# Patient Record
Sex: Female | Born: 1950 | Race: White | Hispanic: No | Marital: Married | Smoking: Former smoker
Health system: Southern US, Community
[De-identification: ages and names within clinical notes are randomized; demographics above are authoritative.]

## PROBLEM LIST (undated history)

## (undated) ENCOUNTER — Telehealth

## (undated) ENCOUNTER — Encounter

## (undated) ENCOUNTER — Ambulatory Visit

## (undated) ENCOUNTER — Encounter: Payer: MEDICARE | Attending: Family Medicine | Primary: Family Medicine

## (undated) ENCOUNTER — Ambulatory Visit: Payer: MEDICARE

## (undated) ENCOUNTER — Encounter: Payer: MEDICARE | Attending: Dermatology | Primary: Dermatology

## (undated) ENCOUNTER — Telehealth: Attending: Clinical | Primary: Clinical

## (undated) ENCOUNTER — Encounter: Payer: MEDICARE | Attending: Clinical | Primary: Clinical

## (undated) ENCOUNTER — Ambulatory Visit: Attending: Clinical | Primary: Clinical

## (undated) ENCOUNTER — Encounter
Attending: Student in an Organized Health Care Education/Training Program | Primary: Student in an Organized Health Care Education/Training Program

## (undated) ENCOUNTER — Encounter: Attending: Mental Health | Primary: Mental Health

## (undated) ENCOUNTER — Ambulatory Visit: Attending: Mental Health | Primary: Mental Health

## (undated) ENCOUNTER — Ambulatory Visit: Attending: Pharmacist | Primary: Pharmacist

## (undated) ENCOUNTER — Inpatient Hospital Stay

## (undated) ENCOUNTER — Encounter: Attending: Family Medicine | Primary: Family Medicine

## (undated) ENCOUNTER — Telehealth: Attending: Family Medicine | Primary: Family Medicine

## (undated) ENCOUNTER — Non-Acute Institutional Stay: Payer: MEDICARE | Attending: Dermatology | Primary: Dermatology

## (undated) ENCOUNTER — Non-Acute Institutional Stay: Payer: MEDICARE

## (undated) ENCOUNTER — Ambulatory Visit: Payer: MEDICARE | Attending: Dermatology | Primary: Dermatology

## (undated) ENCOUNTER — Encounter: Payer: MEDICARE | Attending: Mental Health | Primary: Mental Health

## (undated) ENCOUNTER — Telehealth
Attending: Student in an Organized Health Care Education/Training Program | Primary: Student in an Organized Health Care Education/Training Program

## (undated) ENCOUNTER — Telehealth: Attending: Mental Health | Primary: Mental Health

## (undated) DIAGNOSIS — M810 Age-related osteoporosis without current pathological fracture: Secondary | ICD-10-CM

## (undated) DIAGNOSIS — J449 Chronic obstructive pulmonary disease, unspecified: Secondary | ICD-10-CM

## (undated) DIAGNOSIS — M1990 Unspecified osteoarthritis, unspecified site: Secondary | ICD-10-CM

## (undated) DIAGNOSIS — F329 Major depressive disorder, single episode, unspecified: Secondary | ICD-10-CM

## (undated) DIAGNOSIS — R251 Tremor, unspecified: Secondary | ICD-10-CM

## (undated) DIAGNOSIS — L309 Dermatitis, unspecified: Secondary | ICD-10-CM

## (undated) DIAGNOSIS — R519 Headache, unspecified: Secondary | ICD-10-CM

## (undated) DIAGNOSIS — J45909 Unspecified asthma, uncomplicated: Secondary | ICD-10-CM

## (undated) DIAGNOSIS — R569 Unspecified convulsions: Secondary | ICD-10-CM

## (undated) DIAGNOSIS — Z8669 Personal history of other diseases of the nervous system and sense organs: Secondary | ICD-10-CM

## (undated) DIAGNOSIS — E079 Disorder of thyroid, unspecified: Secondary | ICD-10-CM

## (undated) DIAGNOSIS — G510 Bell's palsy: Secondary | ICD-10-CM

## (undated) DIAGNOSIS — I10 Essential (primary) hypertension: Secondary | ICD-10-CM

## (undated) DIAGNOSIS — F319 Bipolar disorder, unspecified: Secondary | ICD-10-CM

## (undated) DIAGNOSIS — K469 Unspecified abdominal hernia without obstruction or gangrene: Secondary | ICD-10-CM

## (undated) DIAGNOSIS — E559 Vitamin D deficiency, unspecified: Secondary | ICD-10-CM

## (undated) DIAGNOSIS — E538 Deficiency of other specified B group vitamins: Secondary | ICD-10-CM

## (undated) DIAGNOSIS — J309 Allergic rhinitis, unspecified: Secondary | ICD-10-CM

## (undated) DIAGNOSIS — F17200 Nicotine dependence, unspecified, uncomplicated: Secondary | ICD-10-CM

## (undated) DIAGNOSIS — K219 Gastro-esophageal reflux disease without esophagitis: Secondary | ICD-10-CM

## (undated) DIAGNOSIS — A630 Anogenital (venereal) warts: Secondary | ICD-10-CM

## (undated) DIAGNOSIS — R51 Headache: Secondary | ICD-10-CM

## (undated) DIAGNOSIS — G4730 Sleep apnea, unspecified: Secondary | ICD-10-CM

## (undated) DIAGNOSIS — R918 Other nonspecific abnormal finding of lung field: Secondary | ICD-10-CM

## (undated) DIAGNOSIS — F32A Depression, unspecified: Secondary | ICD-10-CM

## (undated) HISTORY — PX: CHOLECYSTECTOMY: SHX55

## (undated) HISTORY — PX: HERNIA REPAIR: SHX51

## (undated) HISTORY — PX: ABDOMINAL HYSTERECTOMY: SHX81

---
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
## 2004-08-07 ENCOUNTER — Ambulatory Visit: Payer: Self-pay | Admitting: Pain Medicine

## 2007-07-02 ENCOUNTER — Ambulatory Visit: Payer: Self-pay | Admitting: Family Medicine

## 2012-11-12 ENCOUNTER — Ambulatory Visit: Payer: Self-pay | Admitting: Specialist

## 2012-11-12 LAB — CREATININE, SERUM: Creatinine: 0.74 mg/dL (ref 0.60–1.30)

## 2013-05-18 ENCOUNTER — Ambulatory Visit: Payer: Self-pay | Admitting: Specialist

## 2013-05-18 LAB — CREATININE, SERUM: EGFR (Non-African Amer.): >60

## 2013-11-16 ENCOUNTER — Ambulatory Visit: Payer: Self-pay | Admitting: Specialist

## 2013-12-22 ENCOUNTER — Ambulatory Visit: Payer: Self-pay | Admitting: Neurology

## 2014-04-22 ENCOUNTER — Other Ambulatory Visit: Payer: Self-pay | Admitting: Oncology

## 2014-09-20 ENCOUNTER — Ambulatory Visit
Admit: 2014-09-20 | Disposition: A | Discharge status: Home / Self Care | Payer: Self-pay | Attending: Specialist | Admitting: Specialist

## 2014-10-12 ENCOUNTER — Other Ambulatory Visit: Payer: Self-pay | Admitting: Neurology

## 2014-10-12 DIAGNOSIS — G510 Bell's palsy: Secondary | ICD-10-CM

## 2014-10-18 ENCOUNTER — Ambulatory Visit
Admission: RE | Admit: 2014-10-18 | Discharge: 2014-10-18 | Disposition: A | Discharge status: Home / Self Care | Payer: Medicare Other | Source: Ambulatory Visit | Attending: Neurology | Admitting: Neurology

## 2014-10-18 DIAGNOSIS — G510 Bell's palsy: Secondary | ICD-10-CM | POA: Diagnosis present

## 2014-10-18 DIAGNOSIS — G319 Degenerative disease of nervous system, unspecified: Secondary | ICD-10-CM | POA: Diagnosis not present

## 2014-10-18 MED ORDER — GADOBENATE DIMEGLUMINE 529 MG/ML IV SOLN
20.0000 mL | Freq: Once | INTRAVENOUS | Status: AC | PRN
  Administered 2014-10-18: 17 mL via INTRAVENOUS

## 2015-06-16 ENCOUNTER — Encounter: Payer: Self-pay | Admitting: *Deleted

## 2015-06-19 ENCOUNTER — Encounter: Admission: RE | Payer: Self-pay | Source: Ambulatory Visit

## 2015-06-19 SURGERY — EGD (ESOPHAGOGASTRODUODENOSCOPY)
Anesthesia: General

## 2015-06-26 ENCOUNTER — Ambulatory Visit: Admission: RE | Admit: 2015-06-26 | Payer: Medicare Other | Source: Ambulatory Visit | Admitting: Gastroenterology

## 2015-06-26 HISTORY — DX: Gastro-esophageal reflux disease without esophagitis: K21.9

## 2015-06-26 HISTORY — DX: Age-related osteoporosis without current pathological fracture: M81.0

## 2015-06-26 HISTORY — DX: Unspecified convulsions: R56.9

## 2015-06-26 HISTORY — DX: Anogenital (venereal) warts: A63.0

## 2015-06-26 HISTORY — DX: Essential (primary) hypertension: I10

## 2015-06-26 HISTORY — DX: Major depressive disorder, single episode, unspecified: F32.9

## 2015-06-26 HISTORY — DX: Unspecified asthma, uncomplicated: J45.909

## 2015-06-26 HISTORY — DX: Unspecified abdominal hernia without obstruction or gangrene: K46.9

## 2015-06-26 HISTORY — DX: Personal history of other diseases of the nervous system and sense organs: Z86.69

## 2015-06-26 HISTORY — DX: Dermatitis, unspecified: L30.9

## 2015-06-26 HISTORY — DX: Unspecified osteoarthritis, unspecified site: M19.90

## 2015-06-26 HISTORY — DX: Chronic obstructive pulmonary disease, unspecified: J44.9

## 2015-06-26 HISTORY — DX: Sleep apnea, unspecified: G47.30

## 2015-06-26 HISTORY — DX: Bell's palsy: G51.0

## 2015-06-26 HISTORY — DX: Disorder of thyroid, unspecified: E07.9

## 2015-06-26 HISTORY — DX: Depression, unspecified: F32.A

## 2015-06-26 HISTORY — DX: Bipolar disorder, unspecified: F31.9

## 2015-07-14 ENCOUNTER — Encounter: Admission: RE | Payer: Self-pay | Source: Ambulatory Visit

## 2015-07-14 ENCOUNTER — Ambulatory Visit: Admission: RE | Admit: 2015-07-14 | Payer: Medicare Other | Source: Ambulatory Visit | Admitting: Gastroenterology

## 2015-07-14 SURGERY — COLONOSCOPY WITH PROPOFOL
Anesthesia: General

## 2015-07-24 ENCOUNTER — Other Ambulatory Visit: Payer: Self-pay | Admitting: Student

## 2015-07-24 DIAGNOSIS — R1084 Generalized abdominal pain: Secondary | ICD-10-CM

## 2015-07-24 DIAGNOSIS — R110 Nausea: Secondary | ICD-10-CM

## 2015-07-28 ENCOUNTER — Ambulatory Visit
Admission: RE | Admit: 2015-07-28 | Discharge: 2015-07-28 | Disposition: A | Discharge status: Home / Self Care | Payer: Medicare Other | Source: Ambulatory Visit | Attending: Student | Admitting: Student

## 2015-07-28 ENCOUNTER — Other Ambulatory Visit
Admission: RE | Admit: 2015-07-28 | Discharge: 2015-07-28 | Disposition: A | Discharge status: Home / Self Care | Payer: Medicare Other | Source: Ambulatory Visit | Attending: Gastroenterology | Admitting: Gastroenterology

## 2015-07-28 DIAGNOSIS — R110 Nausea: Secondary | ICD-10-CM | POA: Diagnosis present

## 2015-07-28 DIAGNOSIS — J449 Chronic obstructive pulmonary disease, unspecified: Secondary | ICD-10-CM | POA: Insufficient documentation

## 2015-07-28 DIAGNOSIS — Z01818 Encounter for other preprocedural examination: Secondary | ICD-10-CM | POA: Diagnosis present

## 2015-07-28 DIAGNOSIS — K529 Noninfective gastroenteritis and colitis, unspecified: Secondary | ICD-10-CM | POA: Diagnosis not present

## 2015-07-28 DIAGNOSIS — E278 Other specified disorders of adrenal gland: Secondary | ICD-10-CM | POA: Insufficient documentation

## 2015-07-28 DIAGNOSIS — I728 Aneurysm of other specified arteries: Secondary | ICD-10-CM | POA: Insufficient documentation

## 2015-07-28 DIAGNOSIS — R1084 Generalized abdominal pain: Secondary | ICD-10-CM

## 2015-07-28 LAB — CREATININE, SERUM: Creatinine, Ser: 0.91 mg/dL (ref 0.44–1.00)

## 2015-07-28 MED ORDER — IOHEXOL 350 MG/ML SOLN
125.0000 mL | Freq: Once | INTRAVENOUS | Status: AC | PRN
  Administered 2015-07-28: 125 mL via INTRAVENOUS

## 2015-08-25 ENCOUNTER — Encounter: Payer: Self-pay | Admitting: *Deleted

## 2015-08-28 ENCOUNTER — Ambulatory Visit: Payer: Medicare Other | Admitting: Anesthesiology

## 2015-08-28 ENCOUNTER — Ambulatory Visit
Admission: RE | Admit: 2015-08-28 | Discharge: 2015-08-28 | Disposition: A | Discharge status: Home / Self Care | Payer: Medicare Other | Source: Ambulatory Visit | Attending: Gastroenterology | Admitting: Gastroenterology

## 2015-08-28 ENCOUNTER — Encounter
Admission: RE | Disposition: A | Discharge status: Home / Self Care | Payer: Self-pay | Source: Ambulatory Visit | Attending: Gastroenterology

## 2015-08-28 ENCOUNTER — Encounter: Payer: Self-pay | Admitting: *Deleted

## 2015-08-28 DIAGNOSIS — G4730 Sleep apnea, unspecified: Secondary | ICD-10-CM | POA: Diagnosis not present

## 2015-08-28 DIAGNOSIS — K644 Residual hemorrhoidal skin tags: Secondary | ICD-10-CM | POA: Insufficient documentation

## 2015-08-28 DIAGNOSIS — R1030 Lower abdominal pain, unspecified: Secondary | ICD-10-CM | POA: Insufficient documentation

## 2015-08-28 DIAGNOSIS — R1084 Generalized abdominal pain: Secondary | ICD-10-CM | POA: Diagnosis not present

## 2015-08-28 DIAGNOSIS — M1990 Unspecified osteoarthritis, unspecified site: Secondary | ICD-10-CM | POA: Insufficient documentation

## 2015-08-28 DIAGNOSIS — M810 Age-related osteoporosis without current pathological fracture: Secondary | ICD-10-CM | POA: Insufficient documentation

## 2015-08-28 DIAGNOSIS — Z882 Allergy status to sulfonamides status: Secondary | ICD-10-CM | POA: Diagnosis not present

## 2015-08-28 DIAGNOSIS — Z79899 Other long term (current) drug therapy: Secondary | ICD-10-CM | POA: Diagnosis not present

## 2015-08-28 DIAGNOSIS — F319 Bipolar disorder, unspecified: Secondary | ICD-10-CM | POA: Insufficient documentation

## 2015-08-28 DIAGNOSIS — I10 Essential (primary) hypertension: Secondary | ICD-10-CM | POA: Diagnosis not present

## 2015-08-28 DIAGNOSIS — J449 Chronic obstructive pulmonary disease, unspecified: Secondary | ICD-10-CM | POA: Diagnosis not present

## 2015-08-28 DIAGNOSIS — D120 Benign neoplasm of cecum: Secondary | ICD-10-CM | POA: Diagnosis not present

## 2015-08-28 DIAGNOSIS — K2900 Acute gastritis without bleeding: Secondary | ICD-10-CM | POA: Insufficient documentation

## 2015-08-28 DIAGNOSIS — G510 Bell's palsy: Secondary | ICD-10-CM | POA: Insufficient documentation

## 2015-08-28 DIAGNOSIS — Z90710 Acquired absence of both cervix and uterus: Secondary | ICD-10-CM | POA: Diagnosis not present

## 2015-08-28 DIAGNOSIS — K219 Gastro-esophageal reflux disease without esophagitis: Secondary | ICD-10-CM | POA: Insufficient documentation

## 2015-08-28 DIAGNOSIS — F17200 Nicotine dependence, unspecified, uncomplicated: Secondary | ICD-10-CM | POA: Insufficient documentation

## 2015-08-28 DIAGNOSIS — D123 Benign neoplasm of transverse colon: Secondary | ICD-10-CM | POA: Insufficient documentation

## 2015-08-28 DIAGNOSIS — Z9049 Acquired absence of other specified parts of digestive tract: Secondary | ICD-10-CM | POA: Diagnosis not present

## 2015-08-28 DIAGNOSIS — Z9981 Dependence on supplemental oxygen: Secondary | ICD-10-CM | POA: Diagnosis not present

## 2015-08-28 DIAGNOSIS — Z888 Allergy status to other drugs, medicaments and biological substances status: Secondary | ICD-10-CM | POA: Insufficient documentation

## 2015-08-28 HISTORY — DX: Headache: R51

## 2015-08-28 HISTORY — DX: Deficiency of other specified B group vitamins: E53.8

## 2015-08-28 HISTORY — PX: COLONOSCOPY WITH PROPOFOL: SHX5780

## 2015-08-28 HISTORY — DX: Headache, unspecified: R51.9

## 2015-08-28 HISTORY — DX: Vitamin D deficiency, unspecified: E55.9

## 2015-08-28 HISTORY — DX: Tremor, unspecified: R25.1

## 2015-08-28 HISTORY — DX: Other nonspecific abnormal finding of lung field: R91.8

## 2015-08-28 HISTORY — DX: Allergic rhinitis, unspecified: J30.9

## 2015-08-28 HISTORY — PX: ESOPHAGOGASTRODUODENOSCOPY (EGD) WITH PROPOFOL: SHX5813

## 2015-08-28 SURGERY — COLONOSCOPY WITH PROPOFOL
Anesthesia: General

## 2015-08-28 MED ORDER — PROPOFOL 500 MG/50ML IV EMUL
INTRAVENOUS | Status: DC | PRN
  Administered 2015-08-28: 175 ug/kg/min via INTRAVENOUS

## 2015-08-28 MED ORDER — ONDANSETRON HCL 4 MG/2ML IJ SOLN
INTRAMUSCULAR | Status: DC | PRN
Start: 2015-08-28 — End: 2015-08-28
  Administered 2015-08-28: 4 mg via INTRAVENOUS

## 2015-08-28 MED ORDER — PROPOFOL 10 MG/ML IV BOLUS
INTRAVENOUS | Status: DC | PRN
  Administered 2015-08-28: 70 mg via INTRAVENOUS

## 2015-08-28 MED ORDER — ONDANSETRON HCL 4 MG/2ML IJ SOLN
4.0000 mg | Freq: Once | INTRAMUSCULAR | Status: AC
  Administered 2015-08-28: 4 mg via INTRAVENOUS

## 2015-08-28 MED ORDER — GLYCOPYRROLATE 0.2 MG/ML IJ SOLN
INTRAMUSCULAR | Status: DC | PRN
  Administered 2015-08-28: 0.2 mg via INTRAVENOUS

## 2015-08-28 MED ORDER — ONDANSETRON HCL 4 MG/2ML IJ SOLN
INTRAMUSCULAR | Status: AC
Start: 2015-08-28 — End: 2015-08-28
  Administered 2015-08-28: 4 mg via INTRAVENOUS
  Filled 2015-08-28: qty 2

## 2015-08-28 MED ORDER — LIDOCAINE HCL (CARDIAC) 20 MG/ML IV SOLN
INTRAVENOUS | Status: DC | PRN
  Administered 2015-08-28: 40 mg via INTRAVENOUS

## 2015-08-28 MED ORDER — SODIUM CHLORIDE 0.9 % IV SOLN
INTRAVENOUS | Status: DC
  Administered 2015-08-28: 1000 mL via INTRAVENOUS

## 2015-08-28 NOTE — Anesthesia Postprocedure Evaluation (Signed)
Anesthesia Post Note  Patient: Judy Erickson  Procedure(s) Performed: Procedure(s) (LRB): COLONOSCOPY WITH PROPOFOL (N/A) ESOPHAGOGASTRODUODENOSCOPY (EGD) WITH PROPOFOL (N/A)  Patient location during evaluation: Endoscopy Anesthesia Type: General Level of consciousness: awake and alert Pain management: pain level controlled Vital Signs Assessment: post-procedure vital signs reviewed and stable Respiratory status: spontaneous breathing, nonlabored ventilation, respiratory function stable and patient connected to nasal cannula oxygen Cardiovascular status: blood pressure returned to baseline and stable Postop Assessment: no signs of nausea or vomiting Anesthetic complications: no    Last Vitals:  Filed Vitals:   08/28/15 1130 08/28/15 1140  BP: 113/58 116/61  Pulse: 87 85  Temp:    Resp: 24 16    Last Pain:  Filed Vitals:   08/28/15 1158  PainSc: 8                  Martha Clan

## 2015-08-28 NOTE — Transfer of Care (Signed)
Immediate Anesthesia Transfer of Care Note  Patient: Judy Erickson  Procedure(s) Performed: Procedure(s): COLONOSCOPY WITH PROPOFOL (N/A) ESOPHAGOGASTRODUODENOSCOPY (EGD) WITH PROPOFOL (N/A)  Patient Location: PACU and Endoscopy Unit  Anesthesia Type:General  Level of Consciousness: sedated  Airway & Oxygen Therapy: Patient Spontanous Breathing and Patient connected to nasal cannula oxygen  Post-op Assessment: Report given to RN and Post -op Vital signs reviewed and stable  Post vital signs: Reviewed and stable  Last Vitals:  Filed Vitals:   08/28/15 0940 08/28/15 1120  BP: 121/78 89/50  Pulse: 87 77  Temp: 36.6 C 35.7 C  Resp: 20 16    Complications: No apparent anesthesia complications

## 2015-08-28 NOTE — Anesthesia Preprocedure Evaluation (Signed)
Anesthesia Evaluation  Patient identified by MRN, date of birth, ID band Patient awake    Reviewed: Allergy & Precautions, H&P , NPO status , Patient's Chart, lab work & pertinent test results, reviewed documented beta blocker date and time   History of Anesthesia Complications (+) PONV and history of anesthetic complications  Airway Mallampati: III  TM Distance: >3 FB Neck ROM: full    Dental no notable dental hx. (+) Partial Upper, Partial Lower   Pulmonary shortness of breath and with exertion, asthma , sleep apnea (no longer has it per the patient) , COPD,  COPD inhaler and oxygen dependent, neg recent URI, Current Smoker,    Pulmonary exam normal breath sounds clear to auscultation       Cardiovascular Exercise Tolerance: Good hypertension, On Medications (-) angina(-) CAD, (-) Past MI, (-) Cardiac Stents and (-) CABG Normal cardiovascular exam(-) dysrhythmias (-) Valvular Problems/Murmurs Rhythm:regular Rate:Normal     Neuro/Psych Seizures -,  PSYCHIATRIC DISORDERS (Bipolar)  Neuromuscular disease (Bell's palsy)    GI/Hepatic Neg liver ROS, hiatal hernia, GERD  ,  Endo/Other  negative endocrine ROS  Renal/GU negative Renal ROS  negative genitourinary   Musculoskeletal   Abdominal   Peds  Hematology negative hematology ROS (+)   Anesthesia Other Findings Past Medical History:   Arthritis                                                    Asthma                                                       COPD (chronic obstructive pulmonary disease) (*              GERD (gastroesophageal reflux disease)                       Abdominal hernia                                             Bell's palsy                                                 Depression                                                   Eczema                                                       Genital warts  History of migraine headaches                                Osteoporosis                                                 Seizures ([place])                                               Sleep apnea                                                  Thyroid disease                                              Allergic rhinitis due to allergen                            Hypertension                                                   Comment:benign   Bipolar disorder ([place])                                         Comment:bipolar affect, depressed   Genital warts                                                Headache                                                       Comment:migraines   Pulmonary nodules                                              Comment:subcentimeter   Tremor                                                       Vitamin B12 deficiency  Vitamin D deficiency                                         Reproductive/Obstetrics negative OB ROS                             Anesthesia Physical Anesthesia Plan  ASA: III  Anesthesia Plan: General   Post-op Pain Management:    Induction:   Airway Management Planned:   Additional Equipment:   Intra-op Plan:   Post-operative Plan:   Informed Consent: I have reviewed the patients History and Physical, chart, labs and discussed the procedure including the risks, benefits and alternatives for the proposed anesthesia with the patient or authorized representative who has indicated his/her understanding and acceptance.   Dental Advisory Given  Plan Discussed with: Anesthesiologist, CRNA and Surgeon  Anesthesia Plan Comments:         Anesthesia Quick Evaluation

## 2015-08-28 NOTE — Anesthesia Procedure Notes (Signed)
Date/Time: 08/28/2015 10:14 AM Performed by: Doreen Salvage Pre-anesthesia Checklist: Patient identified, Emergency Drugs available, Suction available and Patient being monitored Patient Re-evaluated:Patient Re-evaluated prior to inductionOxygen Delivery Method: Nasal cannula Intubation Type: IV induction Dental Injury: Teeth and Oropharynx as per pre-operative assessment  Comments: Nasal cannula with etCO2 monitoring

## 2015-08-28 NOTE — Op Note (Signed)
[facility] Patient Name: Judy Erickson Procedure Date: 08/28/2015 10:18 AM MRN: [id number] Account #: [id number] Date of Birth: [birth year] Admit Type: Outpatient Age: 65 Room: [facility] ENDO ROOM 3 Gender: Female Note Status: Finalized Procedure:            Upper GI endoscopy Indications:          Heartburn, Suspected esophageal reflux, Follow-up of                        hiatal hernia, Nausea with vomiting, Weight loss Patient Profile:      This is a 65 year old female. Providers:            Gerrit Heck. Rayann Heman, MD Referring MD:         Beverely Risen. Bowen (Referring MD) Medicines:            Propofol per Anesthesia Complications:        No immediate complications. Procedure:            Pre-Anesthesia Assessment:                       - Prior to the procedure, a History and Physical was                        performed, and patient medications, allergies and                        sensitivities were reviewed. The patient's tolerance of                        previous anesthesia was reviewed.                       After obtaining informed consent, the endoscope was                        passed under direct vision. Throughout the procedure,                        the patient's blood pressure, pulse, and oxygen                        saturations were monitored continuously. The Endoscope                        was introduced through the mouth, and advanced to the                        second part of duodenum. The upper GI endoscopy was                        accomplished without difficulty. The patient tolerated                        the procedure well. Findings:      The esophagus was normal. No hiatal hernia present during this study.      Diffuse moderate inflammation characterized by erythema was found in the       gastric fundus and in the gastric body. Biopsies were taken with a cold       forceps for histology.  The examined duodenum was normal.     Multiple biopsies were obtained with cold forceps for histology randomly       in the duodenal bulb and in the second portion of the duodenum. Impression:           - Normal esophagus.                       - Gastritis. Biopsied.                       - Normal examined duodenum.                       - Multiple biopsies were obtained in the duodenal bulb                        and in the second portion of the duodenum. Recommendation:       - Perform a colonoscopy.                       - The findings and recommendations were discussed with                        the patient.                       - The findings and recommendations were discussed with                        the patient's family.                       - Await pathology results.                       - Smoking cessation, weight loss. Procedure Code(s):    --- Professional ---                       [phone], Esophagogastroduodenoscopy, flexible, transoral;                        with biopsy, single or multiple Diagnosis Code(s):    --- Professional ---                       K29.70, Gastritis, unspecified, without bleeding                       R12, Heartburn                       K44.9, Diaphragmatic hernia without obstruction or                        gangrene                       R11.2, Nausea with vomiting, unspecified                       R63.4, Abnormal weight loss CPT copyright 2016 American Medical Association. All rights reserved. The codes documented in this report are preliminary and upon coder review may  be revised to meet current compliance requirements. Mellody Life, MD 08/28/2015  10:31:43 AM This report has been signed electronically. Number of Addenda: 0 Note Initiated On: 08/28/2015 10:18 AM      [facility]

## 2015-08-28 NOTE — H&P (Signed)
Primary Care Physician:  Verdie Shire, MD  Pre-Procedure History & Physical: HPI:  Judy Erickson is a 65 y.o. female is here for an endoscopy / colonoscopy   Past Medical History  Diagnosis Date  . Arthritis   . Asthma   . COPD (chronic obstructive pulmonary disease) ([place])   . GERD (gastroesophageal reflux disease)   . Abdominal hernia   . Bell's palsy   . Depression   . Eczema   . Genital warts   . History of migraine headaches   . Osteoporosis   . Seizures ([place])   . Sleep apnea   . Thyroid disease   . Allergic rhinitis due to allergen   . Hypertension     benign  . Bipolar disorder ([place])     bipolar affect, depressed  . Genital warts   . Headache     migraines  . Pulmonary nodules     subcentimeter  . Tremor   . Vitamin B12 deficiency   . Vitamin D deficiency     Past Surgical History  Procedure Laterality Date  . Abdominal hysterectomy    . Cholecystectomy    . Hernia repair      Prior to Admission medications   Medication Sig Start Date End Date Taking? Authorizing Provider  fluticasone (FLONASE) 50 MCG/ACT nasal spray Place into both nostrils daily.   Yes Historical Provider, MD  levocetirizine (XYZAL) 5 MG tablet Take 5 mg by mouth every evening.   Yes Historical Provider, MD  LORazepam (ATIVAN) 1 MG tablet Take 1 mg by mouth every 8 (eight) hours.   Yes Historical Provider, MD  losartan (COZAAR) 100 MG tablet Take 100 mg by mouth daily.   Yes Historical Provider, MD  vitamin B-12 (CYANOCOBALAMIN) 500 MCG tablet Take 500 mcg by mouth daily.   Yes Historical Provider, MD  Vitamin D, Cholecalciferol, 1000 units CAPS Take by mouth.   Yes Historical Provider, MD  albuterol (PROVENTIL HFA) 108 (90 Base) MCG/ACT inhaler Inhale into the lungs every 6 (six) hours as needed for wheezing or shortness of breath.    Historical Provider, MD  albuterol (PROVENTIL) (2.5 MG/3ML) 0.083% nebulizer solution Take 2.5 mg by nebulization every 6 (six) hours as needed for  wheezing or shortness of breath.    Historical Provider, MD  buPROPion (WELLBUTRIN XL) 300 MG 24 hr tablet Take 300 mg by mouth daily.    Historical Provider, MD  clobetasol ointment (TEMOVATE) [id number] % Apply 1 application topically 2 (two) times daily.    Historical Provider, MD  dexlansoprazole (DEXILANT) 60 MG capsule Take 60 mg by mouth daily.    Historical Provider, MD  dicyclomine (BENTYL) 20 MG tablet Take 20 mg by mouth every 6 (six) hours.    Historical Provider, MD  Fluticasone-Salmeterol (ADVAIR DISKUS) 250-50 MCG/DOSE AEPB Inhale 1 puff into the lungs 2 (two) times daily.    Historical Provider, MD  gabapentin (NEURONTIN) 300 MG capsule Take 300 mg by mouth 3 (three) times daily.    Historical Provider, MD  ipratropium (ATROVENT) 0.06 % nasal spray Place 2 sprays into both nostrils 4 (four) times daily.    Historical Provider, MD  Linaclotide Rolan Lipa) 145 MCG CAPS capsule Take 145 mcg by mouth daily.    Historical Provider, MD  lithium carbonate (LITHOBID) 300 MG CR tablet Take by mouth 2 (two) times daily.    Historical Provider, MD  memantine (NAMENDA) 5 MG tablet Take 5 mg by mouth 2 (two) times daily.    Historical  Provider, MD  ondansetron (ZOFRAN) 4 MG tablet Take 4 mg by mouth every 8 (eight) hours as needed for nausea or vomiting.    Historical Provider, MD  QUEtiapine (SEROQUEL) 100 MG tablet Take 100 mg by mouth at bedtime.    Historical Provider, MD  ranitidine (ZANTAC) 150 MG capsule Take 150 mg by mouth 2 (two) times daily.    Historical Provider, MD  sucralfate (CARAFATE) 1 g tablet Take 1 g by mouth 4 (four) times daily -  with meals and at bedtime.    Historical Provider, MD  tiotropium (SPIRIVA) 18 MCG inhalation capsule Place 18 mcg into inhaler and inhale daily.    Historical Provider, MD    Allergies as of 08/16/2015 - Review Complete 07/13/2015  Allergen Reaction Noted  . Nsaids  06/16/2015    History reviewed. No pertinent family history.  Social History    Social History  . Marital Status: Married    Spouse Name: N/A  . Number of Children: N/A  . Years of Education: N/A   Occupational History  . Not on file.   Social History Main Topics  . Smoking status: Current Every Day Smoker  . Smokeless tobacco: Not on file  . Alcohol Use: No  . Drug Use: No  . Sexual Activity: Not on file   Other Topics Concern  . Not on file   Social History Narrative     Physical Exam: BP 121/78 mmHg  Pulse 87  Temp(Src) 97.8 F (36.6 C) (Tympanic)  Resp 20  Ht 5\' 4"  (1.626 m)  Wt 77.111 kg (170 lb)  BMI 29.17 kg/m2  SpO2 98% General:   Alert,  pleasant and cooperative in NAD Head:  Normocephalic and atraumatic. Neck:  Supple; no masses or thyromegaly. Lungs:  Clear throughout to auscultation.    Heart:  Regular rate and rhythm. Abdomen:  Soft, nontender and nondistended. Normal bowel sounds, without guarding, and without rebound.   Neurologic:  Alert and  oriented x4;  grossly normal neurologically.  Impression/Plan: Judy Erickson is here for an endoscopy to be performed for reflux, ,weigiht loss,  f/u HH.   Colon for colitis on CT, abd pain  Risks, benefits, limitations, and alternatives regarding  Endoscopy/colonocopy have been reviewed with the patient.  Questions have been answered.  All parties agreeable.   Josefine Class, MD  08/28/2015, 10:12 AM

## 2015-08-28 NOTE — Discharge Instructions (Signed)

## 2015-08-28 NOTE — Op Note (Addendum)
[facility] Patient Name: Judy Erickson Procedure Date: 08/28/2015 10:31 AM MRN: [id number] Account #: [id number] Date of Birth: 1951-01-04 Admit Type: Outpatient Age: 65 Room: [facility] ENDO ROOM 3 Gender: Female Note Status: Finalized Procedure:            Colonoscopy Indications:          Last colonoscopy: 2006, , Generalized abdominal pain,                        Lower abdominal pain, Diarrhea, Abnormal CT of the GI                        tract( colitis transverse colon) Patient Profile:      This is a 65 year old female. Providers:            Gerrit Heck. Rayann Heman, MD Referring MD:         Beverely Risen. Bowen (Referring MD) Medicines:            Propofol per Anesthesia Complications:        No immediate complications. Procedure:            Pre-Anesthesia Assessment:                       - Prior to the procedure, a History and Physical was                        performed, and patient medications, allergies and                        sensitivities were reviewed. The patient's tolerance of                        previous anesthesia was reviewed.                       After obtaining informed consent, the colonoscope was                        passed under direct vision. Throughout the procedure,                        the patient's blood pressure, pulse, and oxygen                        saturations were monitored continuously. The                        Colonoscope was introduced through the anus and                        advanced to the the terminal ileum. The colonoscopy was                        performed without difficulty. The patient tolerated the                        procedure well. The quality of the bowel preparation                        was good. Findings:  The perianal exam findings include non-thrombosed external hemorrhoids.      A 3 mm polyp was found in the cecum. The polyp was sessile. The polyp       was removed with a jumbo cold  forceps. Resection and retrieval were       complete.      Three sessile polyps were found in the mid transverse colon. The polyps       were 4 to 5 mm in size. These polyps were removed with a cold snare.       Resection and retrieval were complete.      The exam was otherwise without abnormality on direct and retroflexion       views.      Biopsies for histology were taken with a cold forceps from the right       colon, left colon and rectum for evaluation of microscopic colitis. Impression:           - Non-thrombosed external hemorrhoids found on perianal                        exam.                       - One 3 mm polyp in the cecum, removed with a jumbo                        cold forceps. Resected and retrieved.                       - Three 4 to 5 mm polyps in the mid transverse colon,                        removed with a cold snare. Resected and retrieved.                       - The examination was otherwise normal on direct and                        retroflexion views.                       - Biopsies were taken with a cold forceps from the                        right colon, left colon and rectum for evaluation of                        microscopic colitis. Recommendation:       - Observe patient in GI recovery unit.                       - Resume regular diet.                       - Continue present medications.                       - Await pathology results.                       - Repeat colonoscopy for surveillance based on  pathology results.                       - Return to GI clinic.                       - The findings and recommendations were discussed with                        the patient.                       - The findings and recommendations were discussed with                        the patient's family. Procedure Code(s):    --- Professional ---                       [phone], Colonoscopy, flexible; with removal of tumor(s),                         polyp(s), or other lesion(s) by snare technique                       [id number], 51, Colonoscopy, flexible; with biopsy, single                        or multiple CPT copyright 2016 American Medical Association. All rights reserved. The codes documented in this report are preliminary and upon coder review may  be revised to meet current compliance requirements. Mellody Life, MD 08/28/2015 11:20:01 AM This report has been signed electronically. Number of Addenda: 0 Note Initiated On: 08/28/2015 10:31 AM Scope Withdrawal Time: 0 hours 20 minutes 58 seconds  Total Procedure Duration: 0 hours 40 minutes 4 seconds       [facility]

## 2015-08-29 LAB — SURGICAL PATHOLOGY

## 2015-08-30 ENCOUNTER — Encounter: Payer: Self-pay | Admitting: Gastroenterology

## 2015-12-07 ENCOUNTER — Other Ambulatory Visit: Payer: Self-pay | Admitting: Orthopedic Surgery

## 2015-12-07 DIAGNOSIS — M546 Pain in thoracic spine: Secondary | ICD-10-CM

## 2015-12-07 DIAGNOSIS — M5134 Other intervertebral disc degeneration, thoracic region: Secondary | ICD-10-CM

## 2015-12-13 ENCOUNTER — Ambulatory Visit
Admission: RE | Admit: 2015-12-13 | Discharge: 2015-12-13 | Disposition: A | Discharge status: Home / Self Care | Payer: Medicare Other | Source: Ambulatory Visit | Attending: Unknown Physician Specialty | Admitting: Unknown Physician Specialty

## 2015-12-13 ENCOUNTER — Other Ambulatory Visit: Payer: Self-pay | Admitting: Unknown Physician Specialty

## 2015-12-13 DIAGNOSIS — R51 Headache: Principal | ICD-10-CM

## 2015-12-13 DIAGNOSIS — R519 Headache, unspecified: Secondary | ICD-10-CM

## 2015-12-27 ENCOUNTER — Ambulatory Visit
Admission: RE | Admit: 2015-12-27 | Discharge: 2015-12-27 | Disposition: A | Discharge status: Home / Self Care | Payer: Medicare Other | Source: Ambulatory Visit | Attending: Orthopedic Surgery | Admitting: Orthopedic Surgery

## 2015-12-27 DIAGNOSIS — M5184 Other intervertebral disc disorders, thoracic region: Secondary | ICD-10-CM | POA: Diagnosis not present

## 2015-12-27 DIAGNOSIS — M5134 Other intervertebral disc degeneration, thoracic region: Secondary | ICD-10-CM

## 2015-12-27 DIAGNOSIS — M546 Pain in thoracic spine: Secondary | ICD-10-CM | POA: Diagnosis present

## 2016-03-04 ENCOUNTER — Other Ambulatory Visit: Payer: Self-pay | Admitting: Student

## 2016-03-04 DIAGNOSIS — R1013 Epigastric pain: Secondary | ICD-10-CM

## 2016-03-04 DIAGNOSIS — R110 Nausea: Secondary | ICD-10-CM

## 2016-03-20 ENCOUNTER — Encounter
Admission: RE | Admit: 2016-03-20 | Discharge: 2016-03-20 | Disposition: A | Discharge status: Home / Self Care | Payer: Medicare Other | Source: Ambulatory Visit | Attending: Student | Admitting: Student

## 2016-03-20 DIAGNOSIS — R110 Nausea: Secondary | ICD-10-CM | POA: Insufficient documentation

## 2016-03-20 DIAGNOSIS — R1013 Epigastric pain: Secondary | ICD-10-CM | POA: Diagnosis not present

## 2016-03-20 MED ORDER — TECHNETIUM TC 99M SULFUR COLLOID
2.0000 | Freq: Once | INTRAVENOUS | Status: AC | PRN
  Administered 2016-03-20: 2.11 via INTRAVENOUS

## 2016-04-30 ENCOUNTER — Other Ambulatory Visit: Payer: Self-pay | Admitting: Nurse Practitioner

## 2016-04-30 DIAGNOSIS — R42 Dizziness and giddiness: Secondary | ICD-10-CM

## 2016-05-13 ENCOUNTER — Ambulatory Visit
Admission: RE | Admit: 2016-05-13 | Discharge: 2016-05-13 | Disposition: A | Discharge status: Home / Self Care | Payer: Medicare Other | Source: Ambulatory Visit | Attending: Nurse Practitioner | Admitting: Nurse Practitioner

## 2016-05-13 ENCOUNTER — Other Ambulatory Visit: Payer: Self-pay | Admitting: Nurse Practitioner

## 2016-05-13 DIAGNOSIS — R42 Dizziness and giddiness: Secondary | ICD-10-CM

## 2016-05-13 DIAGNOSIS — R930 Abnormal findings on diagnostic imaging of skull and head, not elsewhere classified: Secondary | ICD-10-CM | POA: Diagnosis not present

## 2016-08-01 ENCOUNTER — Encounter: Payer: Self-pay | Admitting: *Deleted

## 2016-08-02 ENCOUNTER — Ambulatory Visit: Payer: Medicare Other | Admitting: Anesthesiology

## 2016-08-02 ENCOUNTER — Encounter
Admission: RE | Disposition: A | Discharge status: Home / Self Care | Payer: Self-pay | Source: Ambulatory Visit | Attending: Unknown Physician Specialty

## 2016-08-02 ENCOUNTER — Encounter: Payer: Self-pay | Admitting: *Deleted

## 2016-08-02 ENCOUNTER — Ambulatory Visit
Admission: RE | Admit: 2016-08-02 | Discharge: 2016-08-02 | Disposition: A | Discharge status: Home / Self Care | Payer: Medicare Other | Source: Ambulatory Visit | Attending: Unknown Physician Specialty | Admitting: Unknown Physician Specialty

## 2016-08-02 DIAGNOSIS — R1310 Dysphagia, unspecified: Secondary | ICD-10-CM | POA: Insufficient documentation

## 2016-08-02 DIAGNOSIS — F17210 Nicotine dependence, cigarettes, uncomplicated: Secondary | ICD-10-CM | POA: Insufficient documentation

## 2016-08-02 DIAGNOSIS — K210 Gastro-esophageal reflux disease with esophagitis: Secondary | ICD-10-CM | POA: Insufficient documentation

## 2016-08-02 DIAGNOSIS — M810 Age-related osteoporosis without current pathological fracture: Secondary | ICD-10-CM | POA: Diagnosis not present

## 2016-08-02 DIAGNOSIS — K2960 Other gastritis without bleeding: Secondary | ICD-10-CM | POA: Insufficient documentation

## 2016-08-02 DIAGNOSIS — J449 Chronic obstructive pulmonary disease, unspecified: Secondary | ICD-10-CM | POA: Diagnosis not present

## 2016-08-02 DIAGNOSIS — G4730 Sleep apnea, unspecified: Secondary | ICD-10-CM | POA: Insufficient documentation

## 2016-08-02 DIAGNOSIS — I10 Essential (primary) hypertension: Secondary | ICD-10-CM | POA: Insufficient documentation

## 2016-08-02 DIAGNOSIS — Z7951 Long term (current) use of inhaled steroids: Secondary | ICD-10-CM | POA: Diagnosis not present

## 2016-08-02 DIAGNOSIS — M1990 Unspecified osteoarthritis, unspecified site: Secondary | ICD-10-CM | POA: Insufficient documentation

## 2016-08-02 DIAGNOSIS — F319 Bipolar disorder, unspecified: Secondary | ICD-10-CM | POA: Insufficient documentation

## 2016-08-02 DIAGNOSIS — Z79899 Other long term (current) drug therapy: Secondary | ICD-10-CM | POA: Diagnosis not present

## 2016-08-02 HISTORY — PX: ESOPHAGOGASTRODUODENOSCOPY: SHX5428

## 2016-08-02 HISTORY — DX: Nicotine dependence, unspecified, uncomplicated: F17.200

## 2016-08-02 SURGERY — EGD (ESOPHAGOGASTRODUODENOSCOPY)
Anesthesia: General

## 2016-08-02 MED ORDER — LIDOCAINE HCL (CARDIAC) 20 MG/ML IV SOLN
INTRAVENOUS | Status: DC | PRN
  Administered 2016-08-02: 2 mL via INTRAVENOUS

## 2016-08-02 MED ORDER — SODIUM CHLORIDE 0.9 % IV SOLN
INTRAVENOUS | Status: DC
  Administered 2016-08-02: 10:00:00 via INTRAVENOUS

## 2016-08-02 MED ORDER — ONDANSETRON HCL 4 MG/2ML IJ SOLN
INTRAMUSCULAR | Status: AC
  Filled 2016-08-02: qty 2

## 2016-08-02 MED ORDER — PROPOFOL 10 MG/ML IV BOLUS
INTRAVENOUS | Status: AC
  Filled 2016-08-02: qty 20

## 2016-08-02 MED ORDER — PROPOFOL 10 MG/ML IV BOLUS
INTRAVENOUS | Status: DC | PRN
  Administered 2016-08-02: 30 mg via INTRAVENOUS

## 2016-08-02 MED ORDER — ONDANSETRON HCL 4 MG/2ML IJ SOLN
INTRAMUSCULAR | Status: DC | PRN
  Administered 2016-08-02: 4 mg via INTRAVENOUS

## 2016-08-02 MED ORDER — SODIUM CHLORIDE 0.9 % IV SOLN: INTRAVENOUS | Status: DC

## 2016-08-02 MED ORDER — PROPOFOL 500 MG/50ML IV EMUL
INTRAVENOUS | Status: DC | PRN
  Administered 2016-08-02: 120 ug/kg/min via INTRAVENOUS

## 2016-08-02 MED ORDER — LIDOCAINE HCL (PF) 2 % IJ SOLN
INTRAMUSCULAR | Status: AC
  Filled 2016-08-02: qty 2

## 2016-08-02 NOTE — Anesthesia Postprocedure Evaluation (Signed)
Anesthesia Post Note  Patient: Judy Erickson  Procedure(s) Performed: Procedure(s) (LRB): ESOPHAGOGASTRODUODENOSCOPY (EGD) (N/A)  Patient location during evaluation: PACU Anesthesia Type: General Level of consciousness: awake Pain management: pain level controlled Vital Signs Assessment: post-procedure vital signs reviewed and stable Respiratory status: spontaneous breathing Cardiovascular status: stable Anesthetic complications: no     Last Vitals:  Vitals:   08/02/16 1102 08/02/16 1112  BP: 116/82 136/66  Pulse: 78 81  Resp: 20 (!) 22  Temp:      Last Pain:  Vitals:   08/02/16 1032  TempSrc: Tympanic                 VAN STAVEREN,[name]

## 2016-08-02 NOTE — Transfer of Care (Signed)
Immediate Anesthesia Transfer of Care Note  Patient: Judy Erickson  Procedure(s) Performed: Procedure(s): ESOPHAGOGASTRODUODENOSCOPY (EGD) (N/A)  Patient Location: PACU  Anesthesia Type:General  Level of Consciousness: awake  Airway & Oxygen Therapy: Patient Spontanous Breathing  Post-op Assessment: Report given to RN  Post vital signs: Reviewed  Last Vitals:  Vitals:   08/02/16 0919  BP: (!) 146/66  Pulse: 83  Resp: 20  Temp: 36.5 C    Last Pain:  Vitals:   08/02/16 0919  TempSrc: Tympanic         Complications: No apparent anesthesia complications

## 2016-08-02 NOTE — Anesthesia Preprocedure Evaluation (Signed)
Anesthesia Evaluation  Patient identified by MRN, date of birth, ID band Patient awake    Reviewed: Allergy & Precautions, NPO status , reviewed documented beta blocker date and time   Airway Mallampati: II       Dental  (+) Upper Dentures, Lower Dentures   Pulmonary sleep apnea , COPD,  COPD inhaler, Current Smoker,     + decreased breath sounds      Cardiovascular Exercise Tolerance: Good hypertension, Pt. on medications  Rhythm:Regular Rate:Normal     Neuro/Psych Seizures -,  Depression Bipolar Disorder    GI/Hepatic Neg liver ROS, GERD  Medicated,  Endo/Other  negative endocrine ROS  Renal/GU negative Renal ROS     Musculoskeletal   Abdominal Normal abdominal exam  (+)   Peds  Hematology   Anesthesia Other Findings   Reproductive/Obstetrics                             Anesthesia Physical Anesthesia Plan  ASA: III  Anesthesia Plan: General   Post-op Pain Management:    Induction: Intravenous  Airway Management Planned: Natural Airway and Nasal Cannula  Additional Equipment:   Intra-op Plan:   Post-operative Plan:   Informed Consent: I have reviewed the patients History and Physical, chart, labs and discussed the procedure including the risks, benefits and alternatives for the proposed anesthesia with the patient or authorized representative who has indicated his/her understanding and acceptance.     Plan Discussed with: Surgeon  Anesthesia Plan Comments:         Anesthesia Quick Evaluation

## 2016-08-02 NOTE — Anesthesia Post-op Follow-up Note (Signed)
Anesthesia QCDR form completed.        

## 2016-08-02 NOTE — Op Note (Signed)
[facility] Patient Name: Judy Erickson Procedure Date: 08/02/2016 10:11 AM MRN: [id number] Account #: [id number] Date of Birth: 12/04/1950 Admit Type: Outpatient Age: 66 Room: [facility] ENDO ROOM 3 Gender: Female Note Status: Finalized Procedure:            Upper GI endoscopy Indications:          Dysphagia, Heartburn Providers:            Manya Silvas, MD Referring MD:         Verdie Shire (Referring MD) Medicines:            Propofol per Anesthesia Complications:        No immediate complications. Procedure:            Pre-Anesthesia Assessment:                       - After reviewing the risks and benefits, the patient                        was deemed in satisfactory condition to undergo the                        procedure.                       After obtaining informed consent, the endoscope was                        passed under direct vision. Throughout the procedure,                        the patient's blood pressure, pulse, and oxygen                        saturations were monitored continuously. The                        Colonoscope was introduced through the mouth, and                        advanced to the second part of duodenum. The upper GI                        endoscopy was accomplished without difficulty. The                        patient tolerated the procedure well. Findings:      LA Grade A (one or more mucosal breaks less than 5 mm, not extending       between tops of 2 mucosal folds) esophagitis with no bleeding was found       40 cm from the incisors. Biopsies were taken with a cold forceps for       histology. At the end of the procedure A guidewire was placed and the       scope was withdrawn. Dilation was performed with a Savary dilator with       mild resistance at 16 mm.      Diffuse and patchy mild inflammation characterized by erythema and       granularity was found in the gastric body and in the gastric  antrum.  Biopsies were taken with a cold forceps for histology. Biopsies were       taken with a cold forceps for Helicobacter pylori testing.      The examined duodenum was normal. Impression:           - LA Grade A reflux esophagitis. Biopsied. Dilated.                       - Gastritis. Biopsied.                       - Normal examined duodenum. Recommendation:       - Await pathology results. Manya Silvas, MD 08/02/2016 10:34:57 AM This report has been signed electronically. Number of Addenda: 0 Note Initiated On: 08/02/2016 10:11 AM      [facility]

## 2016-08-02 NOTE — H&P (Signed)
Primary Care Physician:  Verdie Shire, MD Primary Gastroenterologist:  Dr. Vira Agar  Pre-Procedure History & Physical: HPI:  Judy Erickson is a 66 y.o. female is here for an endoscopy.   Past Medical History:  Diagnosis Date  . Abdominal hernia   . Allergic rhinitis due to allergen   . Arthritis   . Asthma   . Bell's palsy   . Bipolar disorder ([place])    bipolar affect, depressed  . COPD (chronic obstructive pulmonary disease) ([place])   . Depression   . Eczema   . Genital warts   . Genital warts   . GERD (gastroesophageal reflux disease)   . Headache    migraines  . History of migraine headaches   . Hypertension    benign  . Osteoporosis   . Pulmonary nodules    subcentimeter  . Seizures ([place])   . Sleep apnea   . Thyroid disease   . Tobacco use disorder   . Tremor   . Vitamin B12 deficiency   . Vitamin D deficiency     Past Surgical History:  Procedure Laterality Date  . ABDOMINAL HYSTERECTOMY    . CHOLECYSTECTOMY    . COLONOSCOPY WITH PROPOFOL N/A 08/28/2015   Procedure: COLONOSCOPY WITH PROPOFOL;  Surgeon: Josefine Class, MD;  Location: [facility] ENDOSCOPY;  Service: Endoscopy;  Laterality: N/A;  . ESOPHAGOGASTRODUODENOSCOPY (EGD) WITH PROPOFOL N/A 08/28/2015   Procedure: ESOPHAGOGASTRODUODENOSCOPY (EGD) WITH PROPOFOL;  Surgeon: Josefine Class, MD;  Location: [facility] ENDOSCOPY;  Service: Endoscopy;  Laterality: N/A;  . HERNIA REPAIR      Prior to Admission medications   Medication Sig Start Date End Date Taking? Authorizing Provider  acetaminophen (TYLENOL) 500 MG tablet Take 1,000 mg by mouth.   Yes Historical Provider, MD  albuterol (PROVENTIL HFA) 108 (90 Base) MCG/ACT inhaler Inhale into the lungs every 6 (six) hours as needed for wheezing or shortness of breath.   Yes Historical Provider, MD  buPROPion (WELLBUTRIN XL) 300 MG 24 hr tablet Take 300 mg by mouth daily.   Yes Historical Provider, MD  clobetasol ointment (TEMOVATE) [id number] % Apply 1 application  topically 2 (two) times daily.   Yes Historical Provider, MD  dexlansoprazole (DEXILANT) 60 MG capsule Take 60 mg by mouth daily.   Yes Historical Provider, MD  dicyclomine (BENTYL) 20 MG tablet Take 20 mg by mouth every 6 (six) hours.   Yes Historical Provider, MD  fluticasone (FLONASE) 50 MCG/ACT nasal spray Place into both nostrils daily.   Yes Historical Provider, MD  Fluticasone-Salmeterol (ADVAIR DISKUS) 250-50 MCG/DOSE AEPB Inhale 1 puff into the lungs 2 (two) times daily.   Yes Historical Provider, MD  Linaclotide Rolan Lipa) 145 MCG CAPS capsule Take 145 mcg by mouth daily.   Yes Historical Provider, MD  lithium carbonate (LITHOBID) 300 MG CR tablet Take by mouth 2 (two) times daily.   Yes Historical Provider, MD  losartan (COZAAR) 100 MG tablet Take 100 mg by mouth daily.   Yes Historical Provider, MD  memantine (NAMENDA) 5 MG tablet Take 5 mg by mouth 2 (two) times daily.   Yes Historical Provider, MD  ondansetron (ZOFRAN) 4 MG tablet Take 4 mg by mouth every 8 (eight) hours as needed for nausea or vomiting.   Yes Historical Provider, MD  ranitidine (ZANTAC) 150 MG capsule Take 150 mg by mouth 2 (two) times daily.   Yes Historical Provider, MD  vitamin B-12 (CYANOCOBALAMIN) 500 MCG tablet Take 500 mcg by mouth daily.   Yes Historical  Provider, MD  Vitamin D, Cholecalciferol, 1000 units CAPS Take by mouth.   Yes Historical Provider, MD  albuterol (PROVENTIL) (2.5 MG/3ML) 0.083% nebulizer solution Take 2.5 mg by nebulization every 6 (six) hours as needed for wheezing or shortness of breath.    Historical Provider, MD  gabapentin (NEURONTIN) 300 MG capsule Take 300 mg by mouth 3 (three) times daily.    Historical Provider, MD  ipratropium (ATROVENT) 0.06 % nasal spray Place 2 sprays into both nostrils 4 (four) times daily.    Historical Provider, MD  levocetirizine (XYZAL) 5 MG tablet Take 5 mg by mouth every evening.    Historical Provider, MD  LORazepam (ATIVAN) 1 MG tablet Take 1 mg by  mouth every 8 (eight) hours. Take 1/2 tablet by mouth three times a day as needed    Historical Provider, MD  QUEtiapine (SEROQUEL) 100 MG tablet Take 100 mg by mouth at bedtime.    Historical Provider, MD  sucralfate (CARAFATE) 1 g tablet Take 1 g by mouth 4 (four) times daily -  with meals and at bedtime.    Historical Provider, MD  tiotropium (SPIRIVA) 18 MCG inhalation capsule Place 18 mcg into inhaler and inhale daily.    Historical Provider, MD    Allergies as of 07/08/2016 - Review Complete 03/20/2016  Allergen Reaction Noted  . Nsaids  06/16/2015  . Promethazine hcl  08/25/2015  . Sulfa antibiotics  08/25/2015  . Sulfamethoxazole  08/25/2015  . Trimethoprim  08/25/2015    History reviewed. No pertinent family history.  Social History   Social History  . Marital status: Married    Spouse name: N/A  . Number of children: N/A  . Years of education: N/A   Occupational History  . Not on file.   Social History Main Topics  . Smoking status: Current Every Day Smoker  . Smokeless tobacco: Never Used  . Alcohol use No  . Drug use: No  . Sexual activity: Not on file   Other Topics Concern  . Not on file   Social History Narrative  . No narrative on file    Review of Systems: See HPI, otherwise negative ROS  Physical Exam: BP (!) 146/66   Pulse 83   Temp 97.7 F (36.5 C) (Tympanic)   Resp 20   Ht 5\' 4"  (1.626 m)   Wt 72.6 kg (160 lb)   SpO2 100%   BMI 27.46 kg/m  General:   Alert,  pleasant and cooperative in NAD Head:  Normocephalic and atraumatic. Neck:  Supple; no masses or thyromegaly. Lungs:  Clear throughout to auscultation.    Heart:  Regular rate and rhythm. Abdomen:  Soft, nontender and nondistended. Normal bowel sounds, without guarding, and without rebound.   Neurologic:  Alert and  oriented x4;  grossly normal neurologically.  Impression/Plan: PERLEAN FLUD is here for an endoscopy to be performed for epigastric abdominal pain, previous  history of gastric intestinal metaplasia  Risks, benefits, limitations, and alternatives regarding  endoscopy have been reviewed with the patient.  Questions have been answered.  All parties agreeable.   Gaylyn Cheers, MD  08/02/2016, 9:58 AM

## 2016-08-05 ENCOUNTER — Encounter: Payer: Self-pay | Admitting: Unknown Physician Specialty

## 2016-08-06 LAB — SURGICAL PATHOLOGY

## 2016-08-07 ENCOUNTER — Encounter: Payer: Self-pay | Admitting: Unknown Physician Specialty

## 2016-12-18 MED ORDER — ERGOCALCIFEROL (VITAMIN D2) 1,250 MCG (50,000 UNIT) CAPSULE
ORAL_CAPSULE | ORAL | 3 refills | 0 days | Status: CP
Start: 2016-12-18 — End: 2017-01-17

## 2016-12-24 MED ORDER — HYDROCHLOROTHIAZIDE 25 MG TABLET
ORAL_TABLET | Freq: Every morning | ORAL | 5 refills | 0 days | Status: CP
Start: 2016-12-24 — End: 2017-02-07

## 2017-02-07 ENCOUNTER — Ambulatory Visit
Admission: RE | Admit: 2017-02-07 | Discharge: 2017-02-07 | Payer: MEDICARE | Attending: Family Medicine | Admitting: Family Medicine

## 2017-02-07 DIAGNOSIS — E782 Mixed hyperlipidemia: Secondary | ICD-10-CM

## 2017-02-07 DIAGNOSIS — F17200 Nicotine dependence, unspecified, uncomplicated: Secondary | ICD-10-CM

## 2017-02-07 DIAGNOSIS — F3130 Bipolar disorder, current episode depressed, mild or moderate severity, unspecified: Secondary | ICD-10-CM

## 2017-02-07 DIAGNOSIS — E0590 Thyrotoxicosis, unspecified without thyrotoxic crisis or storm: Secondary | ICD-10-CM

## 2017-02-07 DIAGNOSIS — J449 Chronic obstructive pulmonary disease, unspecified: Principal | ICD-10-CM

## 2017-02-07 DIAGNOSIS — I10 Essential (primary) hypertension: Secondary | ICD-10-CM

## 2017-02-07 MED ORDER — ATORVASTATIN 10 MG TABLET
ORAL_TABLET | Freq: Every day | ORAL | 3 refills | 0 days | Status: CP
Start: 2017-02-07 — End: 2018-02-07

## 2017-02-17 ENCOUNTER — Emergency Department
Admission: EM | Admit: 2017-02-17 | Discharge: 2017-02-17 | Disposition: A | Discharge status: Home / Self Care | Payer: MEDICARE | Source: Intra-hospital | Attending: Emergency Medicine

## 2017-02-17 MED ORDER — HYDROXYZINE HCL 25 MG TABLET
ORAL_TABLET | Freq: Four times a day (QID) | ORAL | 0 refills | 0.00000 days | Status: CP
Start: 2017-02-17 — End: 2017-02-20

## 2017-02-17 MED ORDER — CETIRIZINE 10 MG TABLET
ORAL_TABLET | Freq: Every day | ORAL | 0 refills | 0.00000 days | Status: CP
Start: 2017-02-17 — End: 2017-03-19

## 2017-02-17 MED ORDER — PREDNISONE 20 MG TABLET
ORAL_TABLET | Freq: Every day | ORAL | 0 refills | 0.00000 days | Status: CP
Start: 2017-02-17 — End: 2017-02-22

## 2017-04-29 ENCOUNTER — Ambulatory Visit
Admission: RE | Admit: 2017-04-29 | Discharge: 2017-04-29 | Disposition: A | Discharge status: Home / Self Care | Payer: MEDICARE | Attending: Family Medicine | Admitting: Family Medicine

## 2017-04-29 DIAGNOSIS — I10 Essential (primary) hypertension: Principal | ICD-10-CM

## 2017-04-29 DIAGNOSIS — E782 Mixed hyperlipidemia: Secondary | ICD-10-CM

## 2017-04-29 DIAGNOSIS — F17200 Nicotine dependence, unspecified, uncomplicated: Secondary | ICD-10-CM

## 2017-04-29 DIAGNOSIS — F3130 Bipolar disorder, current episode depressed, mild or moderate severity, unspecified: Secondary | ICD-10-CM

## 2017-04-29 DIAGNOSIS — Z9119 Patient's noncompliance with other medical treatment and regimen: Secondary | ICD-10-CM

## 2017-04-29 DIAGNOSIS — J449 Chronic obstructive pulmonary disease, unspecified: Secondary | ICD-10-CM

## 2017-04-29 DIAGNOSIS — H04201 Unspecified epiphora, right lacrimal gland: Secondary | ICD-10-CM

## 2017-04-29 DIAGNOSIS — Z87898 Personal history of other specified conditions: Secondary | ICD-10-CM

## 2017-04-29 DIAGNOSIS — Z1239 Encounter for other screening for malignant neoplasm of breast: Secondary | ICD-10-CM

## 2017-05-09 ENCOUNTER — Ambulatory Visit
Admission: RE | Admit: 2017-05-09 | Discharge: 2017-05-09 | Disposition: A | Discharge status: Home / Self Care | Payer: MEDICARE | Attending: Family Medicine | Admitting: Family Medicine

## 2017-05-09 DIAGNOSIS — E782 Mixed hyperlipidemia: Secondary | ICD-10-CM

## 2017-05-09 DIAGNOSIS — R7989 Other specified abnormal findings of blood chemistry: Secondary | ICD-10-CM

## 2017-05-09 DIAGNOSIS — R7303 Prediabetes: Secondary | ICD-10-CM

## 2017-05-09 DIAGNOSIS — J449 Chronic obstructive pulmonary disease, unspecified: Principal | ICD-10-CM

## 2017-06-13 ENCOUNTER — Encounter: Admit: 2017-06-13 | Discharge: 2017-06-13 | Payer: MEDICARE

## 2017-06-13 DIAGNOSIS — Z1239 Encounter for other screening for malignant neoplasm of breast: Principal | ICD-10-CM

## 2017-06-16 MED ORDER — LOSARTAN 25 MG TABLET
ORAL_TABLET | 1 refills | 0 days | Status: CP
Start: 2017-06-16 — End: 2017-12-17

## 2017-08-13 ENCOUNTER — Encounter: Admit: 2017-08-13 | Discharge: 2017-08-13 | Payer: MEDICARE | Attending: Mental Health | Primary: Mental Health

## 2017-08-13 ENCOUNTER — Ambulatory Visit: Admit: 2017-08-13 | Discharge: 2017-08-13 | Payer: MEDICARE | Attending: Family Medicine | Primary: Family Medicine

## 2017-08-13 DIAGNOSIS — I10 Essential (primary) hypertension: Secondary | ICD-10-CM

## 2017-08-13 DIAGNOSIS — Z0000 Encounter for general adult medical examination without abnormal findings: Principal | ICD-10-CM

## 2017-08-13 DIAGNOSIS — E782 Mixed hyperlipidemia: Secondary | ICD-10-CM

## 2017-08-13 DIAGNOSIS — J449 Chronic obstructive pulmonary disease, unspecified: Principal | ICD-10-CM

## 2017-08-13 DIAGNOSIS — E0590 Thyrotoxicosis, unspecified without thyrotoxic crisis or storm: Secondary | ICD-10-CM

## 2017-08-13 MED ORDER — FLUTICASONE PROPIONATE 50 MCG/ACTUATION NASAL SPRAY,SUSPENSION
Freq: Every day | NASAL | 3 refills | 0 days | Status: CP
Start: 2017-08-13 — End: ?

## 2017-08-14 ENCOUNTER — Encounter: Admit: 2017-08-14 | Discharge: 2017-08-15 | Payer: MEDICARE

## 2017-08-14 DIAGNOSIS — R21 Rash and other nonspecific skin eruption: Principal | ICD-10-CM

## 2017-08-14 MED ORDER — TRIAMCINOLONE ACETONIDE 0.1 % TOPICAL CREAM
Freq: Two times a day (BID) | TOPICAL | 6 refills | 0.00000 days | Status: CP
Start: 2017-08-14 — End: 2017-11-17

## 2017-08-14 MED ORDER — CLOBETASOL 0.05 % TOPICAL CREAM
Freq: Two times a day (BID) | TOPICAL | 6 refills | 0 days | Status: CP
Start: 2017-08-14 — End: 2017-11-17

## 2017-08-14 MED ORDER — GABAPENTIN 300 MG CAPSULE
ORAL_CAPSULE | 3 refills | 0 days | Status: CP
Start: 2017-08-14 — End: 2017-11-17

## 2017-09-23 IMAGING — CT CT ABD-PELV W/ CM
2 of 5 series · 16 of 46 positions shown, 18 images · IV contrast (omnipaque)
Comparison: None.

CLINICAL DATA: 64-year-old presenting with chronic six-month
history of generalized abdominal pain, most severe in the lower
quadrants, associated with nausea. Current history of irritable
bowel syndrome. Surgical history includes hysterectomy,
cholecystectomy and hernia repair.

EXAM:
CT ABDOMEN AND PELVIS WITH CONTRAST
TECHNIQUE: Multidetector CT imaging of the abdomen and pelvis was performed
using the standard protocol following bolus administration of
intravenous contrast.
CONTRAST:  125mL OMNIPAQUE IOHEXOL 350 MG/ML IV. Oral contrast was
also administered.

[Series 2: axial soft tissue · axial · 0.73mm/px · z∈[-820,-405]mm · 13 of 93 slices shown, 15 images]
[im 5/93  soft-tissue]
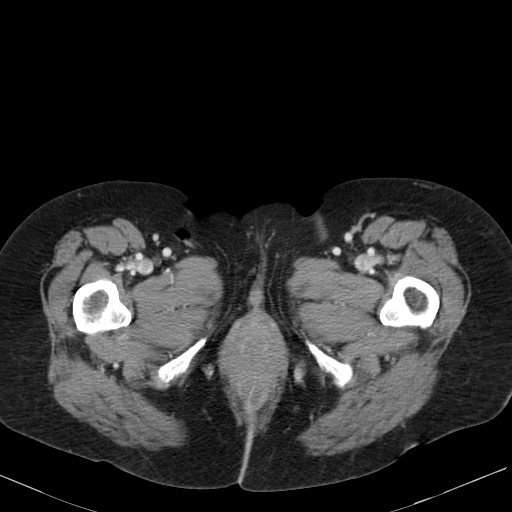
[im 5/93  bone]
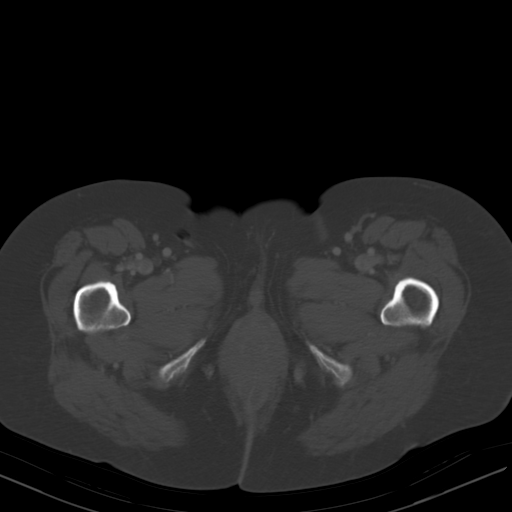
[im 14/93  soft-tissue]
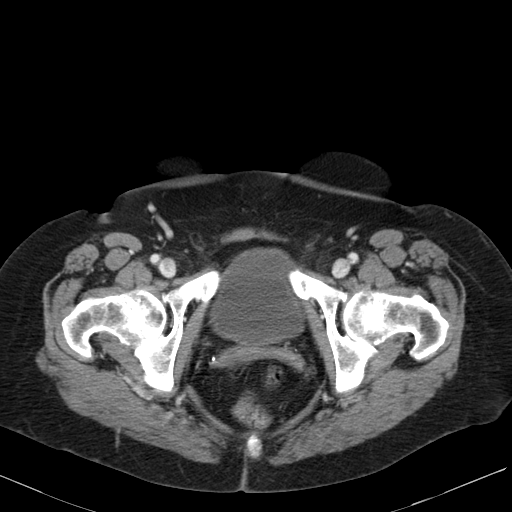
[im 19/93  soft-tissue]
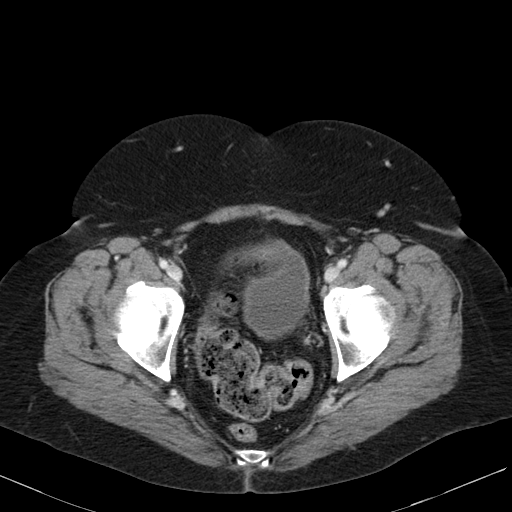
[im 28/93  soft-tissue]
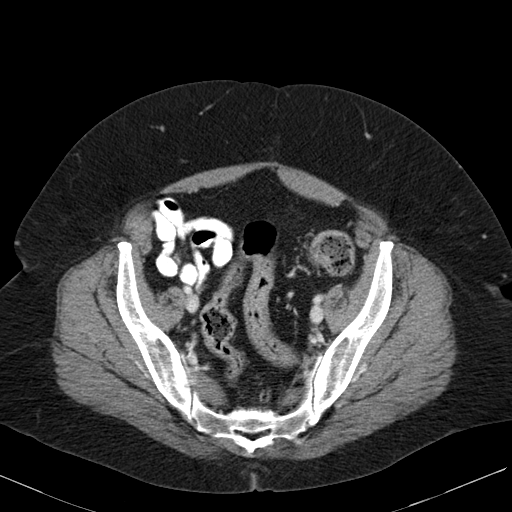
[im 33/93  soft-tissue]
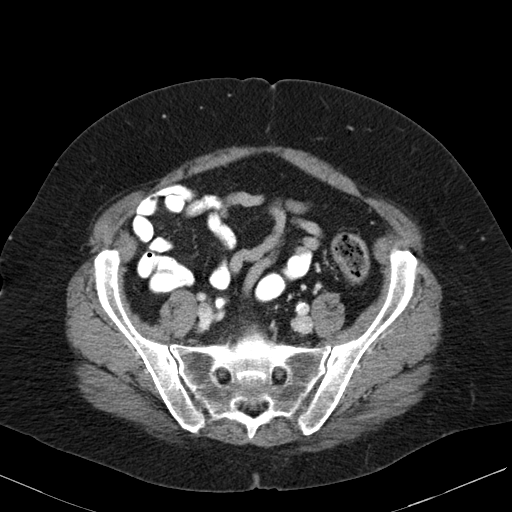
[im 42/93  soft-tissue]
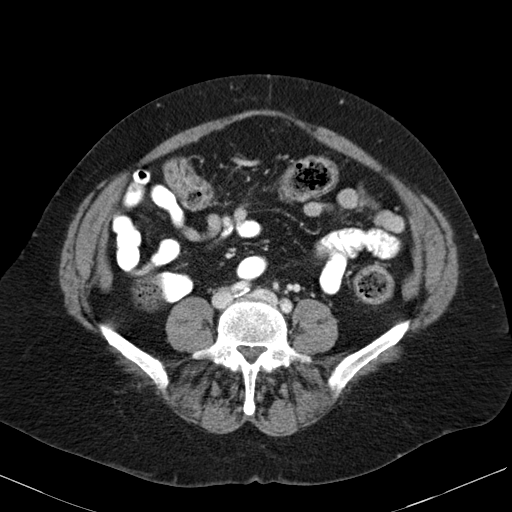
[im 47/93  soft-tissue]
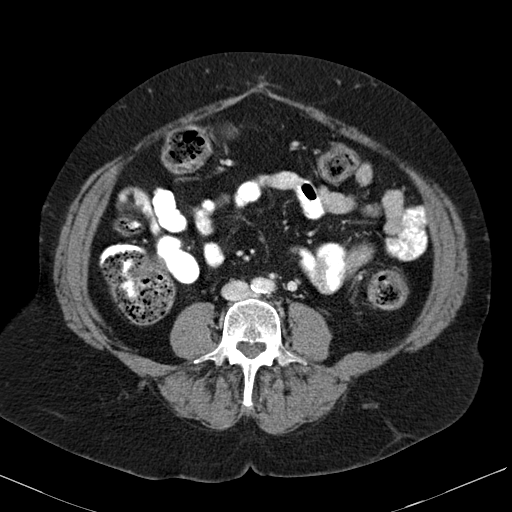
[im 51/93  soft-tissue]
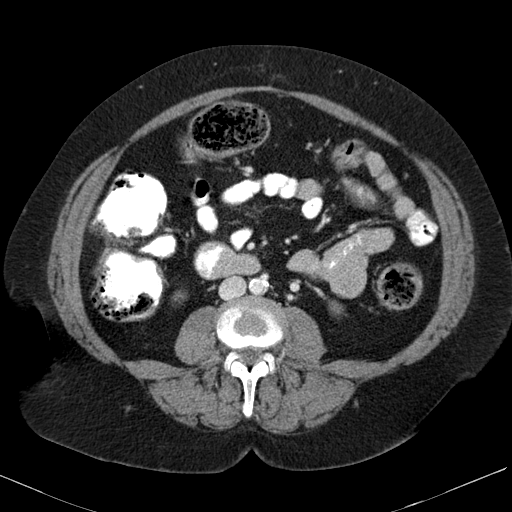
[im 60/93  soft-tissue]
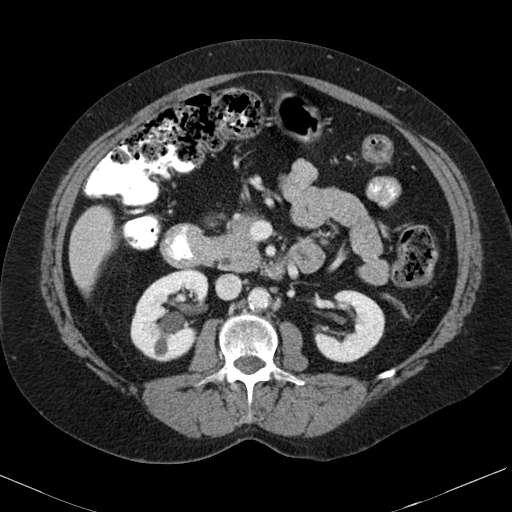
[im 60/93  bone]
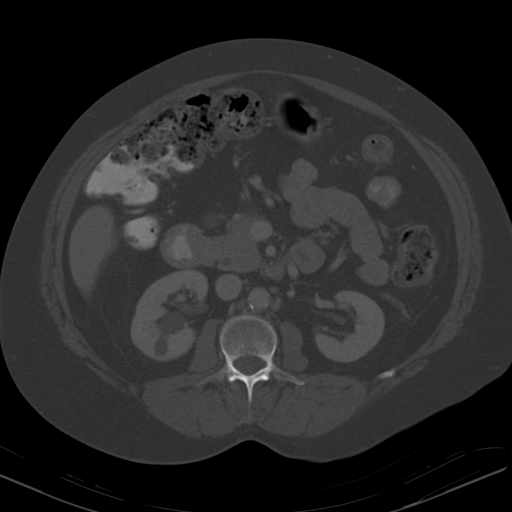
[im 65/93  soft-tissue]
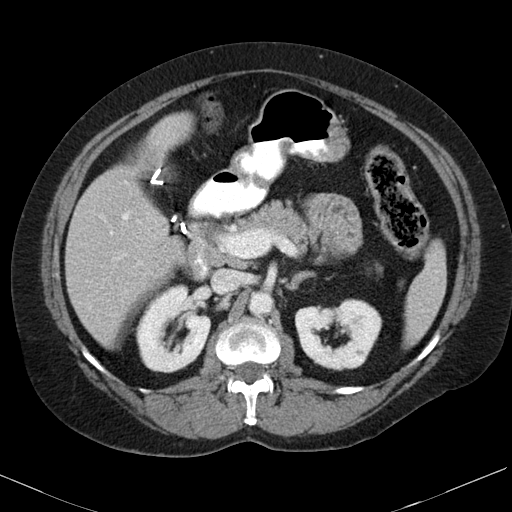
[im 74/93  soft-tissue]
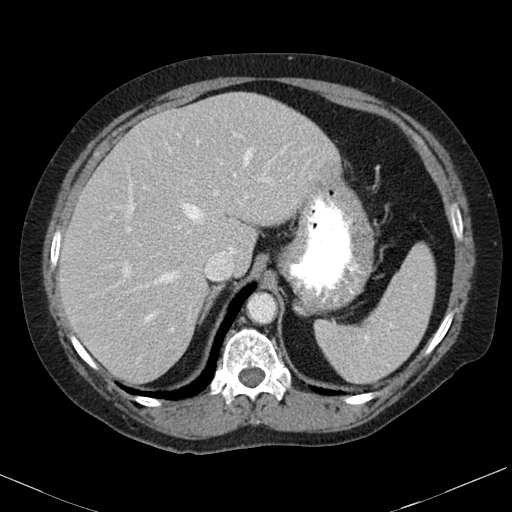
[im 79/93  soft-tissue]
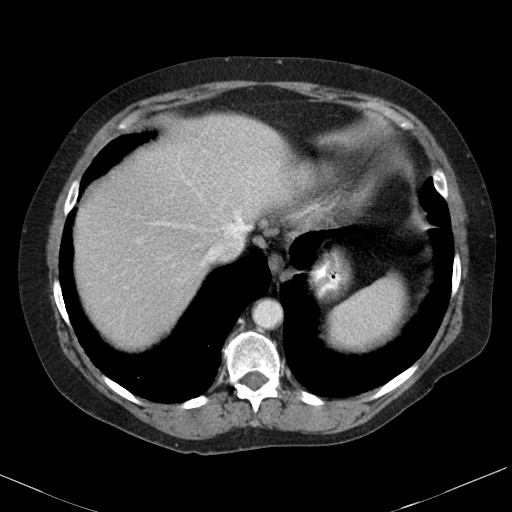
[im 88/93  soft-tissue]
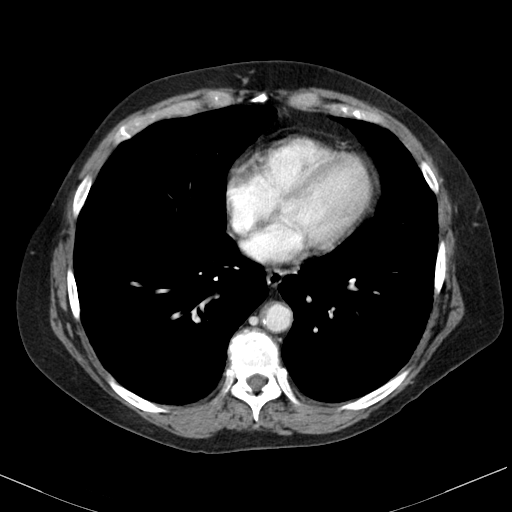

[Series 602: coronal · coronal · 0.90mm/px · 3 of 105 slices shown]
[im 35/105  soft-tissue]
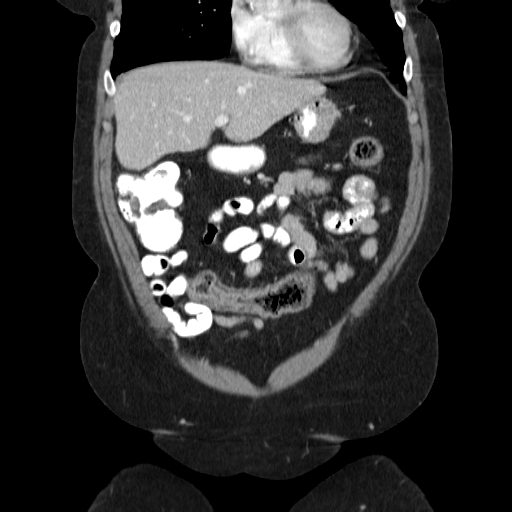
[im 47/105  soft-tissue]
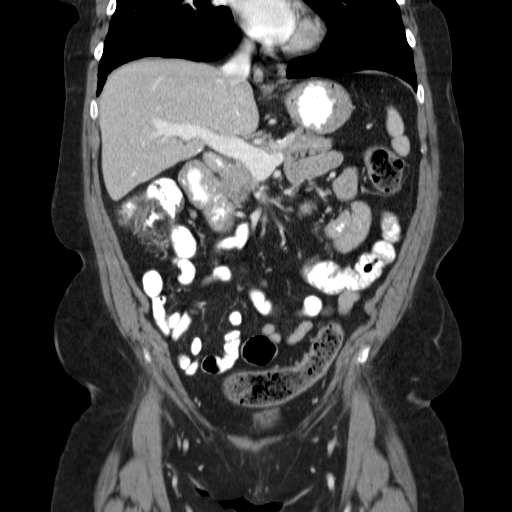
[im 58/105  soft-tissue]
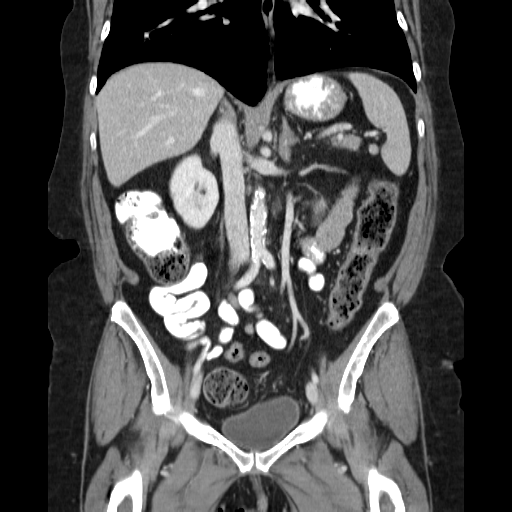

[16 of 46 positions shown; findings below may reference images not displayed]

FINDINGS: Lower chest: Severe emphysematous changes in the visualized lung
bases. Interstitial fibrosis peripherally in the visualized lung
bases. Calcified granuloma in the right lower lobe. Normal heart
size with mild right coronary atherosclerosis.

Hepatobiliary: Liver normal in size and appearance. Gallbladder
surgically absent. No unexpected biliary ductal dilation.

Pancreas: Normal in appearance without evidence of mass, ductal
dilation, or inflammation.

Spleen: Normal in size and appearance. Focus of accessory splenic
tissue medial to the lower pole.

Adrenals/Urinary Tract: Mild enlargement of the left adrenal gland
without nodularity. Normal right adrenal gland. Parapelvic cysts and
cortical cyst involving the mid right kidney. No significant
abnormality involving either kidney. No urinary tract calculi or
obstruction. Urinary bladder decompressed and unremarkable.

Stomach/Bowel: Stomach normal in appearance for the degree of
distention. Normal-appearing small bowel. Focal wall thickening and
hyperemia involving an approximate 15 cm segment of the mid
transverse colon which is positioned in the upper pelvis. Large
diffuse colonic stool burden. No visible colonic diverticulosis.
Sigmoid colon tortuous. Cecum positioned in the right upper
quadrant. Lipoma involving the ileocecal valve. Normal-appearing
decompressed appendix in the right mid abdomen overlying the right
psoas muscle.

Vascular/Lymphatic: Severe aortoiliofemoral atherosclerosis without
evidence of abdominal aortic aneurysm. Approximate 1.7 cm
noncalcified aneurysm arising from the distal splenic artery.
Visceral arteries patent though atherosclerotic.

No pathologic lymphadenopathy.

Reproductive: Surgically absent uterus.  No adnexal masses.

Other: None.

Musculoskeletal: Degenerative disc disease and spondylosis involving
the lower thoracic spine. No significant abnormality involving the
lumbar spine.
IMPRESSION: 1. Focal colitis involving the mid transverse colon which is located
in the upper pelvis. Remainder of the colon normal in appearance
with a large stool burden.
2. Approximate 1.7 cm noncalcified aneurysm involving the distal
splenic artery. Followup CTA abdomen in 6 months is suggested to
confirm stability.
3. Mild left adrenal hyperplasia.
4. Severe COPD/emphysema and mild interstitial fibrosis involving
the visualized lung bases.

## 2017-10-14 ENCOUNTER — Encounter: Admit: 2017-10-14 | Discharge: 2017-10-15 | Payer: MEDICARE | Attending: Family Medicine | Primary: Family Medicine

## 2017-10-14 DIAGNOSIS — B370 Candidal stomatitis: Secondary | ICD-10-CM

## 2017-10-14 DIAGNOSIS — J209 Acute bronchitis, unspecified: Secondary | ICD-10-CM

## 2017-10-14 DIAGNOSIS — Z113 Encounter for screening for infections with a predominantly sexual mode of transmission: Secondary | ICD-10-CM

## 2017-10-14 DIAGNOSIS — B373 Candidiasis of vulva and vagina: Principal | ICD-10-CM

## 2017-10-14 DIAGNOSIS — Z7289 Other problems related to lifestyle: Secondary | ICD-10-CM

## 2017-10-14 MED ORDER — FLUCONAZOLE 100 MG TABLET
ORAL_TABLET | Freq: Every day | ORAL | 0 refills | 0.00000 days | Status: CP
Start: 2017-10-14 — End: 2017-11-21

## 2017-10-14 MED ORDER — DOXYCYCLINE HYCLATE 100 MG TABLET
ORAL_TABLET | Freq: Two times a day (BID) | ORAL | 0 refills | 0.00000 days | Status: CP
Start: 2017-10-14 — End: 2017-10-24

## 2017-10-14 MED ORDER — CLOTRIMAZOLE-BETAMETHASONE 1 %-0.05 % TOPICAL CREAM
1 refills | 0 days | Status: CP
Start: 2017-10-14 — End: 2018-10-14

## 2017-11-17 ENCOUNTER — Encounter: Admit: 2017-11-17 | Discharge: 2017-11-18 | Payer: MEDICARE

## 2017-11-17 DIAGNOSIS — R21 Rash and other nonspecific skin eruption: Secondary | ICD-10-CM

## 2017-11-17 MED ORDER — CLOBETASOL 0.05 % TOPICAL OINTMENT
Freq: Two times a day (BID) | TOPICAL | 6 refills | 0.00000 days | Status: CP
Start: 2017-11-17 — End: 2018-11-17

## 2017-11-17 MED ORDER — GABAPENTIN 300 MG CAPSULE
ORAL_CAPSULE | Freq: Three times a day (TID) | ORAL | 3 refills | 0 days | Status: CP
Start: 2017-11-17 — End: 2018-05-22

## 2017-11-21 ENCOUNTER — Encounter: Admit: 2017-11-21 | Discharge: 2017-11-22 | Payer: MEDICARE | Attending: Family Medicine | Primary: Family Medicine

## 2017-11-21 DIAGNOSIS — J449 Chronic obstructive pulmonary disease, unspecified: Principal | ICD-10-CM

## 2017-11-21 DIAGNOSIS — B370 Candidal stomatitis: Secondary | ICD-10-CM

## 2017-11-21 DIAGNOSIS — I809 Phlebitis and thrombophlebitis of unspecified site: Secondary | ICD-10-CM

## 2017-11-21 MED ORDER — VARENICLINE 1 MG TABLET
ORAL_TABLET | Freq: Two times a day (BID) | ORAL | 1 refills | 0 days | Status: CP
Start: 2017-11-21 — End: ?

## 2017-11-21 MED ORDER — FLUCONAZOLE 100 MG TABLET
ORAL_TABLET | Freq: Every day | ORAL | 0 refills | 0 days | Status: CP
Start: 2017-11-21 — End: 2017-12-21

## 2017-12-17 MED ORDER — LOSARTAN 25 MG TABLET
ORAL_TABLET | 1 refills | 0 days | Status: CP
Start: 2017-12-17 — End: 2018-03-27

## 2018-02-02 ENCOUNTER — Ambulatory Visit: Admit: 2018-02-02 | Discharge: 2018-02-03 | Payer: MEDICARE

## 2018-02-02 DIAGNOSIS — R21 Rash and other nonspecific skin eruption: Secondary | ICD-10-CM

## 2018-02-02 DIAGNOSIS — L281 Prurigo nodularis: Secondary | ICD-10-CM

## 2018-02-26 ENCOUNTER — Encounter: Admit: 2018-02-26 | Discharge: 2018-02-27 | Payer: MEDICARE | Attending: Family Medicine | Primary: Family Medicine

## 2018-02-26 DIAGNOSIS — F17200 Nicotine dependence, unspecified, uncomplicated: Secondary | ICD-10-CM

## 2018-02-26 DIAGNOSIS — M1990 Unspecified osteoarthritis, unspecified site: Principal | ICD-10-CM

## 2018-02-26 DIAGNOSIS — M8580 Other specified disorders of bone density and structure, unspecified site: Secondary | ICD-10-CM

## 2018-02-26 DIAGNOSIS — J449 Chronic obstructive pulmonary disease, unspecified: Secondary | ICD-10-CM

## 2018-02-26 DIAGNOSIS — E2839 Other primary ovarian failure: Secondary | ICD-10-CM

## 2018-02-26 DIAGNOSIS — F3130 Bipolar disorder, current episode depressed, mild or moderate severity, unspecified: Secondary | ICD-10-CM

## 2018-02-26 DIAGNOSIS — L309 Dermatitis, unspecified: Secondary | ICD-10-CM

## 2018-02-26 DIAGNOSIS — R413 Other amnesia: Secondary | ICD-10-CM

## 2018-02-26 DIAGNOSIS — B351 Tinea unguium: Secondary | ICD-10-CM

## 2018-02-26 DIAGNOSIS — I728 Aneurysm of other specified arteries: Secondary | ICD-10-CM

## 2018-02-26 DIAGNOSIS — R799 Abnormal finding of blood chemistry, unspecified: Secondary | ICD-10-CM

## 2018-02-26 DIAGNOSIS — J0410 Acute tracheitis without obstruction: Secondary | ICD-10-CM

## 2018-02-26 DIAGNOSIS — D8989 Other specified disorders involving the immune mechanism, not elsewhere classified: Secondary | ICD-10-CM

## 2018-02-26 DIAGNOSIS — G40909 Epilepsy, unspecified, not intractable, without status epilepticus: Secondary | ICD-10-CM

## 2018-02-26 MED ORDER — TERBINAFINE HCL 250 MG TABLET
ORAL_TABLET | Freq: Every day | ORAL | 0 refills | 0 days | Status: CP
Start: 2018-02-26 — End: 2018-05-27

## 2018-03-10 ENCOUNTER — Encounter: Admit: 2018-03-10 | Discharge: 2018-03-11 | Payer: MEDICARE

## 2018-03-10 DIAGNOSIS — E2839 Other primary ovarian failure: Principal | ICD-10-CM

## 2018-03-29 MED ORDER — LOSARTAN 25 MG TABLET
1 refills | 0 days | Status: CP
Start: 2018-03-29 — End: ?

## 2018-04-03 ENCOUNTER — Encounter: Admit: 2018-04-03 | Discharge: 2018-04-04 | Payer: MEDICARE | Attending: Family Medicine | Primary: Family Medicine

## 2018-04-03 DIAGNOSIS — M8580 Other specified disorders of bone density and structure, unspecified site: Secondary | ICD-10-CM

## 2018-04-03 DIAGNOSIS — R413 Other amnesia: Secondary | ICD-10-CM

## 2018-04-03 DIAGNOSIS — Z9181 History of falling: Secondary | ICD-10-CM

## 2018-04-03 DIAGNOSIS — I10 Essential (primary) hypertension: Secondary | ICD-10-CM

## 2018-04-03 DIAGNOSIS — J449 Chronic obstructive pulmonary disease, unspecified: Principal | ICD-10-CM

## 2018-05-22 MED ORDER — GABAPENTIN 300 MG CAPSULE
0 refills | 0 days | Status: CP
Start: 2018-05-22 — End: 2018-05-28

## 2018-05-28 ENCOUNTER — Ambulatory Visit: Admit: 2018-05-28 | Discharge: 2018-05-29 | Payer: MEDICARE | Attending: Family Medicine | Primary: Family Medicine

## 2018-05-28 DIAGNOSIS — I10 Essential (primary) hypertension: Principal | ICD-10-CM

## 2018-05-28 DIAGNOSIS — J449 Chronic obstructive pulmonary disease, unspecified: Secondary | ICD-10-CM

## 2018-05-28 DIAGNOSIS — G2581 Restless legs syndrome: Secondary | ICD-10-CM

## 2018-05-28 DIAGNOSIS — R0902 Hypoxemia: Secondary | ICD-10-CM

## 2018-05-28 DIAGNOSIS — R7303 Prediabetes: Secondary | ICD-10-CM

## 2018-05-28 DIAGNOSIS — M5431 Sciatica, right side: Secondary | ICD-10-CM

## 2018-05-28 DIAGNOSIS — G4730 Sleep apnea, unspecified: Secondary | ICD-10-CM

## 2018-05-28 MED ORDER — GABAPENTIN 300 MG CAPSULE
ORAL_CAPSULE | 2 refills | 0 days | Status: CP
Start: 2018-05-28 — End: 2018-09-29

## 2018-05-28 MED ORDER — TRAMADOL 50 MG TABLET
ORAL_TABLET | Freq: Two times a day (BID) | ORAL | 0 refills | 0 days | Status: CP | PRN
Start: 2018-05-28 — End: 2019-05-28

## 2018-09-29 MED ORDER — GABAPENTIN 300 MG CAPSULE
0 refills | 0 days | Status: CP
Start: 2018-09-29 — End: 2019-01-11

## 2019-01-11 MED ORDER — GABAPENTIN 300 MG CAPSULE
0 refills | 0 days | Status: CP
Start: 2019-01-11 — End: ?

## 2019-02-23 ENCOUNTER — Encounter: Admit: 2019-02-23 | Discharge: 2019-02-24 | Payer: MEDICARE

## 2019-02-23 DIAGNOSIS — Z79899 Other long term (current) drug therapy: Secondary | ICD-10-CM

## 2019-02-23 DIAGNOSIS — R7303 Prediabetes: Secondary | ICD-10-CM

## 2019-02-23 DIAGNOSIS — I10 Essential (primary) hypertension: Secondary | ICD-10-CM

## 2019-02-23 DIAGNOSIS — R350 Frequency of micturition: Secondary | ICD-10-CM

## 2019-02-23 DIAGNOSIS — D8989 Other specified disorders involving the immune mechanism, not elsewhere classified: Secondary | ICD-10-CM

## 2019-02-23 DIAGNOSIS — R2689 Other abnormalities of gait and mobility: Secondary | ICD-10-CM

## 2019-02-23 DIAGNOSIS — R5381 Other malaise: Secondary | ICD-10-CM

## 2019-02-23 DIAGNOSIS — F3130 Bipolar disorder, current episode depressed, mild or moderate severity, unspecified: Secondary | ICD-10-CM

## 2019-02-23 DIAGNOSIS — I728 Aneurysm of other specified arteries: Secondary | ICD-10-CM

## 2019-02-23 DIAGNOSIS — G40909 Epilepsy, unspecified, not intractable, without status epilepticus: Secondary | ICD-10-CM

## 2019-02-23 DIAGNOSIS — Z9181 History of falling: Secondary | ICD-10-CM

## 2019-02-23 DIAGNOSIS — J449 Chronic obstructive pulmonary disease, unspecified: Secondary | ICD-10-CM

## 2019-02-23 DIAGNOSIS — G2581 Restless legs syndrome: Secondary | ICD-10-CM

## 2019-02-23 DIAGNOSIS — F17200 Nicotine dependence, unspecified, uncomplicated: Secondary | ICD-10-CM

## 2019-02-25 MED ORDER — TRIAMCINOLONE ACETONIDE 0.1 % TOPICAL CREAM
Freq: Two times a day (BID) | TOPICAL | 2 refills | 0 days | Status: CP
Start: 2019-02-25 — End: ?

## 2019-02-25 MED ORDER — LOSARTAN 100 MG TABLET
ORAL_TABLET | Freq: Every day | ORAL | 3 refills | 90 days | Status: CP
Start: 2019-02-25 — End: ?

## 2019-02-27 DIAGNOSIS — Z1239 Encounter for other screening for malignant neoplasm of breast: Secondary | ICD-10-CM

## 2019-03-02 DIAGNOSIS — N644 Mastodynia: Secondary | ICD-10-CM

## 2019-03-02 DIAGNOSIS — Z1239 Encounter for other screening for malignant neoplasm of breast: Secondary | ICD-10-CM

## 2019-04-09 ENCOUNTER — Encounter: Admit: 2019-04-09 | Discharge: 2019-04-10 | Payer: MEDICARE

## 2019-04-09 DIAGNOSIS — F17200 Nicotine dependence, unspecified, uncomplicated: Principal | ICD-10-CM

## 2019-04-09 DIAGNOSIS — Z79899 Other long term (current) drug therapy: Principal | ICD-10-CM

## 2019-04-09 DIAGNOSIS — I10 Essential (primary) hypertension: Principal | ICD-10-CM

## 2019-04-09 DIAGNOSIS — G2581 Restless legs syndrome: Principal | ICD-10-CM

## 2019-04-09 DIAGNOSIS — R2689 Other abnormalities of gait and mobility: Principal | ICD-10-CM

## 2019-04-09 DIAGNOSIS — R350 Frequency of micturition: Principal | ICD-10-CM

## 2019-04-09 DIAGNOSIS — Z9181 History of falling: Principal | ICD-10-CM

## 2019-04-09 DIAGNOSIS — R252 Cramp and spasm: Principal | ICD-10-CM

## 2019-04-09 DIAGNOSIS — R7303 Prediabetes: Principal | ICD-10-CM

## 2019-04-09 MED ORDER — CLOTRIMAZOLE-BETAMETHASONE 1 %-0.05 % TOPICAL CREAM
1 refills | 0 days | Status: CP
Start: 2019-04-09 — End: 2020-04-08

## 2019-04-14 ENCOUNTER — Encounter: Admit: 2019-04-14 | Discharge: 2019-04-14 | Payer: MEDICARE

## 2019-04-19 MED ORDER — GABAPENTIN 300 MG CAPSULE
1 refills | 0 days | Status: CP
Start: 2019-04-19 — End: ?

## 2019-05-12 ENCOUNTER — Encounter: Admit: 2019-05-12 | Discharge: 2019-05-13 | Payer: MEDICARE | Attending: Dermatology | Primary: Dermatology

## 2019-05-12 DIAGNOSIS — R21 Rash and other nonspecific skin eruption: Principal | ICD-10-CM

## 2019-05-12 MED ORDER — METHOTREXATE SODIUM 2.5 MG TABLET
ORAL_TABLET | ORAL | 0 refills | 0.00000 days | Status: CP
Start: 2019-05-12 — End: 2019-06-11

## 2019-05-12 MED ORDER — FOLIC ACID 1 MG TABLET
ORAL_TABLET | Freq: Every day | ORAL | 3 refills | 0.00000 days | Status: CP
Start: 2019-05-12 — End: 2020-05-11

## 2019-05-12 MED ORDER — CLOTRIMAZOLE-BETAMETHASONE 1 %-0.05 % TOPICAL CREAM
Freq: Two times a day (BID) | TOPICAL | 3 refills | 0.00000 days | Status: CP
Start: 2019-05-12 — End: 2020-05-11

## 2019-05-19 ENCOUNTER — Encounter
Admit: 2019-05-19 | Discharge: 2019-05-20 | Disposition: A | Discharge status: Home / Self Care | Payer: MEDICARE | Attending: Emergency Medicine

## 2019-05-19 DIAGNOSIS — R51 Headache: Principal | ICD-10-CM

## 2019-05-19 MED ORDER — MECLIZINE 12.5 MG TABLET
ORAL_TABLET | Freq: Three times a day (TID) | ORAL | 0 refills | 4 days | Status: CP | PRN
Start: 2019-05-19 — End: 2019-05-22

## 2019-05-26 MED ORDER — MECLIZINE 12.5 MG TABLET
ORAL_TABLET | Freq: Three times a day (TID) | ORAL | 0 refills | 4.00000 days | Status: CP | PRN
Start: 2019-05-26 — End: 2019-05-29

## 2019-06-22 ENCOUNTER — Encounter: Admit: 2019-06-22 | Discharge: 2019-06-23 | Payer: MEDICARE | Attending: Dermatology | Primary: Dermatology

## 2019-07-15 ENCOUNTER — Encounter: Admit: 2019-07-15 | Discharge: 2019-07-16 | Payer: MEDICARE

## 2019-07-15 DIAGNOSIS — E538 Deficiency of other specified B group vitamins: Principal | ICD-10-CM

## 2019-07-15 DIAGNOSIS — Z1211 Encounter for screening for malignant neoplasm of colon: Principal | ICD-10-CM

## 2019-07-15 DIAGNOSIS — I10 Essential (primary) hypertension: Principal | ICD-10-CM

## 2019-07-15 DIAGNOSIS — K3189 Other diseases of stomach and duodenum: Principal | ICD-10-CM

## 2019-07-15 DIAGNOSIS — K2970 Gastritis, unspecified, without bleeding: Secondary | ICD-10-CM

## 2019-07-15 DIAGNOSIS — D126 Benign neoplasm of colon, unspecified: Principal | ICD-10-CM

## 2019-07-15 DIAGNOSIS — G4730 Sleep apnea, unspecified: Principal | ICD-10-CM

## 2019-07-15 DIAGNOSIS — J449 Chronic obstructive pulmonary disease, unspecified: Principal | ICD-10-CM

## 2019-08-02 ENCOUNTER — Other Ambulatory Visit: Payer: Self-pay | Admitting: Specialist

## 2019-08-02 DIAGNOSIS — J849 Interstitial pulmonary disease, unspecified: Secondary | ICD-10-CM

## 2019-08-02 DIAGNOSIS — R0602 Shortness of breath: Secondary | ICD-10-CM

## 2019-08-05 ENCOUNTER — Encounter: Admit: 2019-08-05 | Discharge: 2019-08-06 | Payer: MEDICARE

## 2019-08-05 DIAGNOSIS — L308 Other specified dermatitis: Principal | ICD-10-CM

## 2019-08-05 DIAGNOSIS — L650 Telogen effluvium: Principal | ICD-10-CM

## 2019-08-05 DIAGNOSIS — L821 Other seborrheic keratosis: Principal | ICD-10-CM

## 2019-08-05 MED ORDER — TRIAMCINOLONE ACETONIDE 0.1 % TOPICAL CREAM
Freq: Two times a day (BID) | TOPICAL | 2 refills | 0.00000 days | Status: CP
Start: 2019-08-05 — End: ?

## 2019-08-13 ENCOUNTER — Ambulatory Visit
Admission: RE | Admit: 2019-08-13 | Discharge: 2019-08-13 | Disposition: A | Discharge status: Home / Self Care | Payer: Medicare Other | Source: Ambulatory Visit | Attending: Specialist | Admitting: Specialist

## 2019-08-13 ENCOUNTER — Other Ambulatory Visit: Payer: Self-pay

## 2019-08-13 DIAGNOSIS — J849 Interstitial pulmonary disease, unspecified: Secondary | ICD-10-CM | POA: Diagnosis present

## 2019-08-13 DIAGNOSIS — R0602 Shortness of breath: Secondary | ICD-10-CM | POA: Diagnosis present

## 2019-10-04 MED ORDER — GABAPENTIN 300 MG CAPSULE
ORAL_CAPSULE | 0 refills | 0 days | Status: CP
Start: 2019-10-04 — End: ?

## 2019-10-29 ENCOUNTER — Encounter: Admit: 2019-10-29 | Discharge: 2019-10-30 | Payer: MEDICARE

## 2019-10-29 DIAGNOSIS — R252 Cramp and spasm: Principal | ICD-10-CM

## 2019-10-29 DIAGNOSIS — M1990 Unspecified osteoarthritis, unspecified site: Principal | ICD-10-CM

## 2019-11-05 ENCOUNTER — Ambulatory Visit: Admit: 2019-11-05 | Discharge: 2019-11-05 | Discharge status: Against Medical Advice | Payer: MEDICARE

## 2019-11-19 MED ORDER — NICOTINE (POLACRILEX) 4 MG BUCCAL LOZENGE
BUCCAL | 3 refills | 2 days | Status: CP | PRN
Start: 2019-11-19 — End: ?

## 2019-11-19 MED ORDER — NICOTINE 21 MG/24 HR DAILY TRANSDERMAL PATCH
MEDICATED_PATCH | TRANSDERMAL | 0 refills | 42 days | Status: CP
Start: 2019-11-19 — End: 2019-12-31

## 2019-11-22 ENCOUNTER — Ambulatory Visit: Admit: 2019-11-22 | Discharge: 2019-11-23 | Payer: MEDICARE

## 2019-11-22 MED ORDER — CLOBETASOL 0.05 % TOPICAL OINTMENT
Freq: Two times a day (BID) | TOPICAL | 1 refills | 0 days | Status: CP
Start: 2019-11-22 — End: 2020-11-21

## 2019-11-22 MED ORDER — FOLIC ACID 1 MG TABLET
ORAL_TABLET | 3 refills | 0 days | Status: CP
Start: 2019-11-22 — End: ?

## 2019-11-22 MED ORDER — METHOTREXATE SODIUM 2.5 MG TABLET
ORAL_TABLET | 3 refills | 0 days | Status: CP
Start: 2019-11-22 — End: ?

## 2019-12-16 ENCOUNTER — Other Ambulatory Visit: Admit: 2019-12-16 | Discharge: 2019-12-17 | Payer: MEDICARE

## 2019-12-28 ENCOUNTER — Ambulatory Visit: Admit: 2019-12-28 | Discharge: 2019-12-29 | Payer: MEDICARE | Attending: Family Medicine | Primary: Family Medicine

## 2019-12-28 DIAGNOSIS — I10 Essential (primary) hypertension: Principal | ICD-10-CM

## 2019-12-28 DIAGNOSIS — J302 Other seasonal allergic rhinitis: Principal | ICD-10-CM

## 2019-12-28 DIAGNOSIS — R29898 Other symptoms and signs involving the musculoskeletal system: Principal | ICD-10-CM

## 2019-12-28 DIAGNOSIS — G4730 Sleep apnea, unspecified: Principal | ICD-10-CM

## 2019-12-28 DIAGNOSIS — R519 Sinus headache: Principal | ICD-10-CM

## 2019-12-28 DIAGNOSIS — J449 Chronic obstructive pulmonary disease, unspecified: Principal | ICD-10-CM

## 2019-12-28 DIAGNOSIS — R252 Cramp and spasm: Principal | ICD-10-CM

## 2019-12-28 MED ORDER — GABAPENTIN 300 MG CAPSULE
ORAL_CAPSULE | 3 refills | 0 days | Status: CP
Start: 2019-12-28 — End: ?

## 2019-12-28 MED ORDER — FLUTICASONE PROPIONATE 50 MCG/ACTUATION NASAL SPRAY,SUSPENSION
Freq: Every day | NASAL | 12 refills | 0 days | Status: CP
Start: 2019-12-28 — End: ?

## 2020-01-04 ENCOUNTER — Encounter: Admit: 2020-01-04 | Discharge: 2020-01-05 | Payer: MEDICARE

## 2020-01-04 ENCOUNTER — Encounter: Admit: 2020-01-04 | Discharge: 2020-01-05 | Payer: MEDICARE | Attending: Clinical | Primary: Clinical

## 2020-01-04 DIAGNOSIS — Z0000 Encounter for general adult medical examination without abnormal findings: Principal | ICD-10-CM

## 2020-01-10 ENCOUNTER — Encounter: Admit: 2020-01-10 | Discharge: 2020-01-11 | Payer: MEDICARE | Attending: Clinical | Primary: Clinical

## 2020-01-13 ENCOUNTER — Encounter: Admit: 2020-01-13 | Discharge: 2020-01-14 | Payer: MEDICARE | Attending: Clinical | Primary: Clinical

## 2020-01-24 MED ORDER — LOSARTAN 100 MG TABLET
ORAL_TABLET | 1 refills | 0 days | Status: CP
Start: 2020-01-24 — End: ?

## 2020-01-26 ENCOUNTER — Ambulatory Visit: Admit: 2020-01-26 | Discharge: 2020-01-27 | Payer: MEDICARE | Attending: Dermatology | Primary: Dermatology

## 2020-01-26 DIAGNOSIS — L309 Dermatitis, unspecified: Principal | ICD-10-CM

## 2020-01-26 MED ORDER — METHOTREXATE SODIUM 2.5 MG TABLET
ORAL_TABLET | 3 refills | 0.00000 days | Status: CP
Start: 2020-01-26 — End: ?

## 2020-01-26 MED ORDER — CLOBETASOL 0.05 % TOPICAL CREAM
OPHTHALMIC | 6 refills | 0.00000 days | Status: CP
Start: 2020-01-26 — End: ?

## 2020-02-02 ENCOUNTER — Encounter: Admit: 2020-02-02 | Discharge: 2020-02-03 | Payer: MEDICARE | Attending: Clinical | Primary: Clinical

## 2020-02-02 ENCOUNTER — Other Ambulatory Visit: Payer: Self-pay | Admitting: Neurology

## 2020-02-02 ENCOUNTER — Other Ambulatory Visit (HOSPITAL_COMMUNITY): Payer: Self-pay | Admitting: Neurology

## 2020-02-02 DIAGNOSIS — R4189 Other symptoms and signs involving cognitive functions and awareness: Secondary | ICD-10-CM

## 2020-02-02 DIAGNOSIS — R29898 Other symptoms and signs involving the musculoskeletal system: Secondary | ICD-10-CM

## 2020-02-02 DIAGNOSIS — M545 Low back pain, unspecified: Secondary | ICD-10-CM

## 2020-02-10 ENCOUNTER — Encounter: Admit: 2020-02-10 | Discharge: 2020-02-11 | Payer: MEDICARE | Attending: Clinical | Primary: Clinical

## 2020-02-15 ENCOUNTER — Ambulatory Visit: Admit: 2020-02-15 | Discharge: 2020-02-16 | Payer: MEDICARE

## 2020-02-15 DIAGNOSIS — I10 Essential (primary) hypertension: Principal | ICD-10-CM

## 2020-02-15 DIAGNOSIS — R252 Cramp and spasm: Principal | ICD-10-CM

## 2020-02-15 DIAGNOSIS — J449 Chronic obstructive pulmonary disease, unspecified: Principal | ICD-10-CM

## 2020-02-18 ENCOUNTER — Ambulatory Visit (HOSPITAL_COMMUNITY): Payer: Medicare Other

## 2020-02-18 ENCOUNTER — Encounter (HOSPITAL_COMMUNITY): Payer: Self-pay

## 2020-02-18 ENCOUNTER — Other Ambulatory Visit (HOSPITAL_COMMUNITY): Payer: Medicare Other

## 2020-03-21 ENCOUNTER — Institutional Professional Consult (permissible substitution): Admit: 2020-03-21 | Discharge: 2020-03-22 | Payer: MEDICARE

## 2020-03-23 MED ORDER — TRIAMCINOLONE ACETONIDE 0.1 % TOPICAL CREAM
2 refills | 0 days | Status: CP
Start: 2020-03-23 — End: ?

## 2020-04-13 ENCOUNTER — Encounter: Admit: 2020-04-13 | Discharge: 2020-04-14 | Payer: MEDICARE

## 2020-04-13 DIAGNOSIS — F3130 Bipolar disorder, current episode depressed, mild or moderate severity, unspecified: Principal | ICD-10-CM

## 2020-04-13 DIAGNOSIS — R413 Other amnesia: Principal | ICD-10-CM

## 2020-04-13 DIAGNOSIS — I10 Essential (primary) hypertension: Principal | ICD-10-CM

## 2020-04-13 DIAGNOSIS — J449 Chronic obstructive pulmonary disease, unspecified: Principal | ICD-10-CM

## 2020-04-13 DIAGNOSIS — Z9181 History of falling: Principal | ICD-10-CM

## 2020-04-25 ENCOUNTER — Ambulatory Visit
Payer: Medicare Other | Attending: Student in an Organized Health Care Education/Training Program | Admitting: Student in an Organized Health Care Education/Training Program

## 2020-04-25 ENCOUNTER — Other Ambulatory Visit: Payer: Self-pay

## 2020-04-25 ENCOUNTER — Encounter: Payer: Self-pay | Admitting: Student in an Organized Health Care Education/Training Program

## 2020-04-25 VITALS — BP 129/59 | HR 94 | Temp 98.0°F | Resp 18 | Ht 64.0 in | Wt 162.0 lb

## 2020-04-25 DIAGNOSIS — M48062 Spinal stenosis, lumbar region with neurogenic claudication: Secondary | ICD-10-CM

## 2020-04-25 DIAGNOSIS — G608 Other hereditary and idiopathic neuropathies: Secondary | ICD-10-CM | POA: Diagnosis present

## 2020-04-25 DIAGNOSIS — G5793 Unspecified mononeuropathy of bilateral lower limbs: Secondary | ICD-10-CM

## 2020-04-25 DIAGNOSIS — G8929 Other chronic pain: Secondary | ICD-10-CM | POA: Diagnosis present

## 2020-04-25 DIAGNOSIS — G894 Chronic pain syndrome: Secondary | ICD-10-CM

## 2020-04-25 DIAGNOSIS — M47816 Spondylosis without myelopathy or radiculopathy, lumbar region: Secondary | ICD-10-CM | POA: Insufficient documentation

## 2020-04-25 DIAGNOSIS — M5416 Radiculopathy, lumbar region: Secondary | ICD-10-CM

## 2020-04-25 NOTE — Progress Notes (Signed)
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 test: deferred today         S-I Gaenslen's test: deferred today         *(Flexion, ABduction and External Rotation)  Gait & Posture Assessment  Ambulation: Patient came in today in a wheel chair Gait: Significantly limited. Dependent on assistive device to ambulate Posture: Difficulty standing up straight, due to pain   Lower Extremity Exam    Side: Right lower extremity  Side: Left lower extremity  Stability: No instability observed          Stability: No instability observed          Skin & Extremity Inspection: Skin color, temperature, and hair growth are WNL. No peripheral edema or cyanosis. No masses, redness, swelling, asymmetry, or associated skin lesions. No contractures.  Skin & Extremity Inspection: Skin color, temperature, and hair growth are WNL. No peripheral edema or cyanosis. No masses, redness, swelling, asymmetry, or associated skin  lesions. No contractures.  Functional ROM: Pain restricted ROM for hip and knee joints          Functional ROM: Pain restricted ROM for all joints of the lower extremity  Muscle Tone/Strength: Functionally intact. No obvious neuro-muscular anomalies detected.  Muscle Tone/Strength: Functionally intact. No obvious neuro-muscular anomalies detected.  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  Visit Diagnosis (New problems to examiner): 1. 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 Spinal stenosis, lumbar region, with neurogenic claudication   3. Lumbar radiculopathy   4. Lumbar facet arthropathy   5. Lumbar spondylosis   6. Neuropathic pain of both legs   7. 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 Chronic pain syndrome    General Recommendations: The pain condition that the patient suffers from is best treated with a multidisciplinary approach that involves an increase in physical activity to prevent de-conditioning and worsening of the pain cycle, as well as psychological counseling (formal and/or informal) to address the co-morbid psychological affects of pain. Treatment will often involve judicious use of pain medications and interventional procedures to  decrease the pain, allowing the patient to participate in the physical activity that will ultimately produce long-lasting pain reductions. The goal of the multidisciplinary approach is to return the patient to a higher level of overall function and to restore their ability to perform activities of daily living.  I had extensive discussion with the patient regarding treatment plan in the context of her medical history.  Continue gabapentin as prescribed.  Dose escalation resulted in cognitive side effects.  Future considerations could include transition to Lyrica.  Avoid NSAIDs in the context of chronic kidney disease.  Avoid muscle relaxers given history of sedation in dementia.  Do not recommend chronic opioid therapy given her mental status, history of dementia and severe COPD.  Discussed interventional therapies.  Reviewed lumbar MRI in detail which shows lumbar foraminal stenosis at multiple levels including L3-L4, L4-L5, L5-S1 along with moderate spinal stenosis and associated neurogenic claudication from a clinical standpoint.  Patient is working with physical therapy at home.  For session was last week and.  I encouraged her to continue.  We will focus on interventional treatments.  Recommend lumbar epidural steroid injection at L4-L5.  Risk and benefits reviewed and patient would like to proceed.  Plan of Care (Initial workup plan)   Procedure Orders     Lumbar Epidural Injection  Interventional management options: Judy Erickson was informed that there is no guarantee that she would be a candidate for interventional therapies. The decision will be based on the results of diagnostic studies, as well as Judy Erickson's risk profile.  Procedure(s) under consideration:  Lumbar epidural steroid injection Lumbar facet medial branch nerve block, lumbar radiofrequency ablation.   Provider-requested follow-up: Return in about 2 weeks (around 05/09/2020) for L-ESI L3/4 , without sedation.  Future  Appointments  Date Time Provider Okay  05/15/2020 10:00 AM Gillis Santa, MD [facility] None    Note by: Gillis Santa, MD Date: 04/25/2020; Time: 11:18 AM

## 2020-04-25 NOTE — Progress Notes (Signed)
Safety precautions to be maintained throughout the outpatient stay will include: orient to surroundings, keep bed in low position, maintain call bell within reach at all times, provide assistance with transfer out of bed and ambulation.  

## 2020-04-25 NOTE — Patient Instructions (Signed)

## 2020-05-01 ENCOUNTER — Encounter
Admit: 2020-05-01 | Discharge: 2020-05-02 | Payer: MEDICARE | Attending: Nurse Practitioner | Primary: Nurse Practitioner

## 2020-05-01 DIAGNOSIS — J441 Chronic obstructive pulmonary disease with (acute) exacerbation: Principal | ICD-10-CM

## 2020-05-01 DIAGNOSIS — J329 Chronic sinusitis, unspecified: Principal | ICD-10-CM

## 2020-05-01 MED ORDER — DOXYCYCLINE HYCLATE 100 MG CAPSULE
ORAL_CAPSULE | 0 refills | 0 days | Status: CP
Start: 2020-05-01 — End: 2020-05-22

## 2020-05-01 MED ORDER — PREDNISONE 20 MG TABLET
ORAL_TABLET | ORAL | 0 refills | 0.00000 days | Status: CP
Start: 2020-05-01 — End: 2020-05-22

## 2020-05-15 ENCOUNTER — Encounter: Admit: 2020-05-15 | Discharge: 2020-05-16 | Payer: MEDICARE | Attending: Clinical | Primary: Clinical

## 2020-05-15 ENCOUNTER — Ambulatory Visit (HOSPITAL_BASED_OUTPATIENT_CLINIC_OR_DEPARTMENT_OTHER): Payer: Medicare Other | Admitting: Student in an Organized Health Care Education/Training Program

## 2020-05-15 ENCOUNTER — Other Ambulatory Visit: Payer: Self-pay

## 2020-05-15 ENCOUNTER — Encounter: Payer: Self-pay | Admitting: Student in an Organized Health Care Education/Training Program

## 2020-05-15 ENCOUNTER — Ambulatory Visit
Admission: RE | Admit: 2020-05-15 | Discharge: 2020-05-15 | Disposition: A | Discharge status: Home / Self Care | Payer: Medicare Other | Source: Ambulatory Visit | Attending: Student in an Organized Health Care Education/Training Program | Admitting: Student in an Organized Health Care Education/Training Program

## 2020-05-15 VITALS — BP 180/87 | HR 105 | Temp 97.2°F | Resp 18 | Ht 64.0 in | Wt 160.0 lb

## 2020-05-15 DIAGNOSIS — Z0000 Encounter for general adult medical examination without abnormal findings: Principal | ICD-10-CM

## 2020-05-15 DIAGNOSIS — G8929 Other chronic pain: Secondary | ICD-10-CM | POA: Insufficient documentation

## 2020-05-15 DIAGNOSIS — M5416 Radiculopathy, lumbar region: Secondary | ICD-10-CM | POA: Diagnosis present

## 2020-05-15 DIAGNOSIS — M48062 Spinal stenosis, lumbar region with neurogenic claudication: Secondary | ICD-10-CM | POA: Insufficient documentation

## 2020-05-15 DIAGNOSIS — G894 Chronic pain syndrome: Secondary | ICD-10-CM

## 2020-05-15 MED ORDER — DIAZEPAM 5 MG PO TABS
ORAL_TABLET | ORAL | Status: AC
  Filled 2020-05-15: qty 1

## 2020-05-15 MED ORDER — ROPIVACAINE HCL 2 MG/ML IJ SOLN
2.0000 mL | Freq: Once | INTRAMUSCULAR | Status: AC
  Administered 2020-05-15: 2 mL via EPIDURAL
  Filled 2020-05-15: qty 10

## 2020-05-15 MED ORDER — SODIUM CHLORIDE 0.9% FLUSH
2.0000 mL | Freq: Once | INTRAVENOUS | Status: AC
  Administered 2020-05-15: 2 mL

## 2020-05-15 MED ORDER — DEXAMETHASONE SODIUM PHOSPHATE 10 MG/ML IJ SOLN
10.0000 mg | Freq: Once | INTRAMUSCULAR | Status: AC
  Administered 2020-05-15: 10 mg
  Filled 2020-05-15: qty 1

## 2020-05-15 MED ORDER — DIAZEPAM 5 MG PO TABS
2.5000 mg | ORAL_TABLET | Freq: Once | ORAL | Status: AC
  Administered 2020-05-15: 5 mg via ORAL

## 2020-05-15 MED ORDER — IOHEXOL 180 MG/ML  SOLN
10.0000 mL | Freq: Once | INTRAMUSCULAR | Status: AC
  Administered 2020-05-15: 10 mL via EPIDURAL

## 2020-05-15 MED ORDER — LIDOCAINE HCL 2 % IJ SOLN
20.0000 mL | Freq: Once | INTRAMUSCULAR | Status: AC
  Administered 2020-05-15: 400 mg

## 2020-05-15 MED ORDER — LOSARTAN 100 MG TABLET
ORAL_TABLET | 1 refills | 0 days | Status: CP
Start: 2020-05-15 — End: ?

## 2020-05-15 NOTE — Patient Instructions (Signed)
GENERAL RISKS AND COMPLICATIONS  What are the risk, side effects and possible complications? Generally speaking, most procedures are safe.  However, with any procedure there are risks, side effects, and the possibility of complications.  The risks and complications are dependent upon the sites that are lesioned, or the type of nerve block to be performed.  The closer the procedure is to the spine, the more serious the risks are.  Great care is taken when placing the radio frequency needles, block needles or lesioning probes, but sometimes complications can occur. 1. Infection: Any time there is an injection through the skin, there is a risk of infection.  This is why sterile conditions are used for these blocks.  There are four possible types of infection. 1. Localized skin infection. 2. Central Nervous System Infection-This can be in the form of Meningitis, which can be deadly. 3. Epidural Infections-This can be in the form of an epidural abscess, which can cause pressure inside of the spine, causing compression of the spinal cord with subsequent paralysis. This would require an emergency surgery to decompress, and there are no guarantees that the patient would recover from the paralysis. 4. Discitis-This is an infection of the intervertebral discs.  It occurs in about 1% of discography procedures.  It is difficult to treat and it may lead to surgery.        2. Pain: the needles have to go through skin and soft tissues, will cause soreness.       3. Damage to internal structures:  The nerves to be lesioned may be near blood vessels or    other nerves which can be potentially damaged.       4. Bleeding: Bleeding is more common if the patient is taking blood thinners such as  aspirin, Coumadin, Ticiid, Plavix, etc., or if he/she have some genetic predisposition  such as hemophilia. Bleeding into the spinal canal can cause compression of the spinal  cord with subsequent paralysis.  This would require an  emergency surgery to  decompress and there are no guarantees that the patient would recover from the  paralysis.       5. Pneumothorax:  Puncturing of a lung is a possibility, every time a needle is introduced in  the area of the chest or upper back.  Pneumothorax refers to free air around the  collapsed lung(s), inside of the thoracic cavity (chest cavity).  Another two possible  complications related to a similar event would include: Hemothorax and Chylothorax.   These are variations of the Pneumothorax, where instead of air around the collapsed  lung(s), you may have blood or chyle, respectively.       6. Spinal headaches: They may occur with any procedures in the area of the spine.       7. Persistent CSF (Cerebro-Spinal Fluid) leakage: This is a rare problem, but may occur  with prolonged intrathecal or epidural catheters either due to the formation of a fistulous  track or a dural tear.       8. Nerve damage: By working so close to the spinal cord, there is always a possibility of  nerve damage, which could be as serious as a permanent spinal cord injury with  paralysis.       9. Death:  Although rare, severe deadly allergic reactions known as "Anaphylactic  reaction" can occur to any of the medications used.      10. Worsening of the symptoms:  We can always make thing worse.    What are the chances of something like this happening? Chances of any of this occuring are extremely low.  By statistics, you have more of a chance of getting killed in a motor vehicle accident: while driving to the hospital than any of the above occurring .  Nevertheless, you should be aware that they are possibilities.  In general, it is similar to taking a shower.  Everybody knows that you can slip, hit your head and get killed.  Does that mean that you should not shower again?  Nevertheless always keep in mind that statistics do not mean anything if you happen to be on the wrong side of them.  Even if a procedure has a 1  (one) in a 1,000,000 (million) chance of going wrong, it you happen to be that one..Also, keep in mind that by statistics, you have more of a chance of having something go wrong when taking medications.  Who should not have this procedure? If you are on a blood thinning medication (e.g. Coumadin, Plavix, see list of "Blood Thinners"), or if you have an active infection going on, you should not have the procedure.  If you are taking any blood thinners, please inform your physician.  How should I prepare for this procedure?  Do not eat or drink anything at least six hours prior to the procedure.  Bring a driver with you .  It cannot be a taxi.  Come accompanied by an adult that can drive you back, and that is strong enough to help you if your legs get weak or numb from the local anesthetic.  Take all of your medicines the morning of the procedure with just enough water to swallow them.  If you have diabetes, make sure that you are scheduled to have your procedure done first thing in the morning, whenever possible.  If you have diabetes, take only half of your insulin dose and notify our nurse that you have done so as soon as you arrive at the clinic.  If you are diabetic, but only take blood sugar pills (oral hypoglycemic), then do not take them on the morning of your procedure.  You may take them after you have had the procedure.  Do not take aspirin or any aspirin-containing medications, at least eleven (11) days prior to the procedure.  They may prolong bleeding.  Wear loose fitting clothing that may be easy to take off and that you would not mind if it got stained with Betadine or blood.  Do not wear any jewelry or perfume  Remove any nail coloring.  It will interfere with some of our monitoring equipment.  NOTE: Remember that this is not meant to be interpreted as a complete list of all possible complications.  Unforeseen problems may occur.  BLOOD THINNERS The following drugs  contain aspirin or other products, which can cause increased bleeding during surgery and should not be taken for 2 weeks prior to and 1 week after surgery.  If you should need take something for relief of minor pain, you may take acetaminophen which is found in Tylenol,m Datril, Anacin-3 and Panadol. It is not blood thinner. The products listed below are.  Do not take any of the products listed below in addition to any listed on your instruction sheet.  A.P.C or A.P.C with Codeine Codeine Phosphate Capsules #3 Ibuprofen Ridaura  ABC compound Congesprin Imuran rimadil  Advil Cope Indocin Robaxisal  Alka-Seltzer Effervescent Pain Reliever and Antacid Coricidin or Coricidin-D  Indomethacin Rufen    Alka-Seltzer plus Cold Medicine Cosprin Ketoprofen S-A-C Tablets  Anacin Analgesic Tablets or Capsules Coumadin Korlgesic Salflex  Anacin Extra Strength Analgesic tablets or capsules CP-2 Tablets Lanoril Salicylate  Anaprox Cuprimine Capsules Levenox Salocol  Anexsia-D Dalteparin Magan Salsalate  Anodynos Darvon compound Magnesium Salicylate Sine-off  Ansaid Dasin Capsules Magsal Sodium Salicylate  Anturane Depen Capsules Marnal Soma  APF Arthritis pain formula Dewitt's Pills Measurin Stanback  Argesic Dia-Gesic Meclofenamic Sulfinpyrazone  Arthritis Bayer Timed Release Aspirin Diclofenac Meclomen Sulindac  Arthritis pain formula Anacin Dicumarol Medipren Supac  Analgesic (Safety coated) Arthralgen Diffunasal Mefanamic Suprofen  Arthritis Strength Bufferin Dihydrocodeine Mepro Compound Suprol  Arthropan liquid Dopirydamole Methcarbomol with Aspirin Synalgos  ASA tablets/Enseals Disalcid Micrainin Tagament  Ascriptin Doan's Midol Talwin  Ascriptin A/D Dolene Mobidin Tanderil  Ascriptin Extra Strength Dolobid Moblgesic Ticlid  Ascriptin with Codeine Doloprin or Doloprin with Codeine Momentum Tolectin  Asperbuf Duoprin Mono-gesic Trendar  Aspergum Duradyne Motrin or Motrin IB Triminicin  Aspirin  plain, buffered or enteric coated Durasal Myochrisine Trigesic  Aspirin Suppositories Easprin Nalfon Trillsate  Aspirin with Codeine Ecotrin Regular or Extra Strength Naprosyn Uracel  Atromid-S Efficin Naproxen Ursinus  Auranofin Capsules Elmiron Neocylate Vanquish  Axotal Emagrin Norgesic Verin  Azathioprine Empirin or Empirin with Codeine Normiflo Vitamin E  Azolid Emprazil Nuprin Voltaren  Bayer Aspirin plain, buffered or children's or timed BC Tablets or powders Encaprin Orgaran Warfarin Sodium  Buff-a-Comp Enoxaparin Orudis Zorpin  Buff-a-Comp with Codeine Equegesic Os-Cal-Gesic   Buffaprin Excedrin plain, buffered or Extra Strength Oxalid   Bufferin Arthritis Strength Feldene Oxphenbutazone   Bufferin plain or Extra Strength Feldene Capsules Oxycodone with Aspirin   Bufferin with Codeine Fenoprofen Fenoprofen Pabalate or Pabalate-SF   Buffets II Flogesic Panagesic   Buffinol plain or Extra Strength Florinal or Florinal with Codeine Panwarfarin   Buf-Tabs Flurbiprofen Penicillamine   Butalbital Compound Four-way cold tablets Penicillin   Butazolidin Fragmin Pepto-Bismol   Carbenicillin Geminisyn Percodan   Carna Arthritis Reliever Geopen Persantine   Carprofen Gold's salt Persistin   Chloramphenicol Goody's Phenylbutazone   Chloromycetin Haltrain Piroxlcam   Clmetidine heparin Plaquenil   Cllnoril Hyco-pap Ponstel   Clofibrate Hydroxy chloroquine Propoxyphen         Before stopping any of these medications, be sure to consult the physician who ordered them.  Some, such as Coumadin (Warfarin) are ordered to prevent or treat serious conditions such as "deep thrombosis", "pumonary embolisms", and other heart problems.  The amount of time that you may need off of the medication may also vary with the medication and the reason for which you were taking it.  If you are taking any of these medications, please make sure you notify your pain physician before you undergo any  procedures.         Pain Management Discharge Instructions  General Discharge Instructions :  If you need to reach your doctor call: Monday-Friday 8:00 am - 4:00 pm at [phone] or toll free [phone].  After clinic hours [phone] to have operator reach doctor.  Bring all of your medication bottles to all your appointments in the pain clinic.  To cancel or reschedule your appointment with Pain Management please remember to call 24 hours in advance to avoid a fee.  Refer to the educational materials which you have been given on: General Risks, I had my Procedure. Discharge Instructions, Post Sedation.  Post Procedure Instructions:  The drugs you were given will stay in your system until tomorrow, so for the next 24 hours you should   not drive, make any legal decisions or drink any alcoholic beverages.  You may eat anything you prefer, but it is better to start with liquids then soups and crackers, and gradually work up to solid foods.  Please notify your doctor immediately if you have any unusual bleeding, trouble breathing or pain that is not related to your normal pain.  Depending on the type of procedure that was done, some parts of your body may feel week and/or numb.  This usually clears up by tonight or the next day.  Walk with the use of an assistive device or accompanied by an adult for the 24 hours.  You may use ice on the affected area for the first 24 hours.  Put ice in a Ziploc bag and cover with a towel and place against area 15 minutes on 15 minutes off.  You may switch to heat after 24 hours.Epidural Steroid Injection Patient Information  Description: The epidural space surrounds the nerves as they exit the spinal cord.  In some patients, the nerves can be compressed and inflamed by a bulging disc or a tight spinal canal (spinal stenosis).  By injecting steroids into the epidural space, we can bring irritated nerves into direct contact with a potentially  helpful medication.  These steroids act directly on the irritated nerves and can reduce swelling and inflammation which often leads to decreased pain.  Epidural steroids may be injected anywhere along the spine and from the neck to the low back depending upon the location of your pain.   After numbing the skin with local anesthetic (like Novocaine), a small needle is passed into the epidural space slowly.  You may experience a sensation of pressure while this is being done.  The entire block usually last less than 10 minutes.  Conditions which may be treated by epidural steroids:   Low back and leg pain  Neck and arm pain  Spinal stenosis  Post-laminectomy syndrome  Herpes zoster (shingles) pain  Pain from compression fractures  Preparation for the injection:  1. Do not eat any solid food or dairy products within 8 hours of your appointment.  2. You may drink clear liquids up to 3 hours before appointment.  Clear liquids include water, black coffee, juice or soda.  No milk or cream please. 3. You may take your regular medication, including pain medications, with a sip of water before your appointment  Diabetics should hold regular insulin (if taken separately) and take 1/2 normal NPH dos the morning of the procedure.  Carry some sugar containing items with you to your appointment. 4. A driver must accompany you and be prepared to drive you home after your procedure.  5. Bring all your current medications with your. 6. An IV may be inserted and sedation may be given at the discretion of the physician.   7. A blood pressure cuff, EKG and other monitors will often be applied during the procedure.  Some patients may need to have extra oxygen administered for a short period. 8. You will be asked to provide medical information, including your allergies, prior to the procedure.  We must know immediately if you are taking blood thinners (like Coumadin/Warfarin)  Or if you are allergic to IV iodine  contrast (dye). We must know if you could possible be pregnant.  Possible side-effects:  Bleeding from needle site  Infection (rare, may require surgery)  Nerve injury (rare)  Numbness & tingling (temporary)  Difficulty urinating (rare, temporary)  Spinal headache (  a headache worse with upright posture)  Light -headedness (temporary)  Pain at injection site (several days)  Decreased blood pressure (temporary)  Weakness in arm/leg (temporary)  Pressure sensation in back/neck (temporary)  Call if you experience:  Fever/chills associated with headache or increased back/neck pain.  Headache worsened by an upright position.  New onset weakness or numbness of an extremity below the injection site  Hives or difficulty breathing (go to the emergency room)  Inflammation or drainage at the infection site  Severe back/neck pain  Any new symptoms which are concerning to you  Please note:  Although the local anesthetic injected can often make your back or neck feel good for several hours after the injection, the pain will likely return.  It takes 3-7 days for steroids to work in the epidural space.  You may not notice any pain relief for at least that one week.  If effective, we will often do a series of three injections spaced 3-6 weeks apart to maximally decrease your pain.  After the initial series, we generally will wait several months before considering a repeat injection of the same type.  If you have any questions, please call [phone] [facility]

## 2020-05-15 NOTE — Progress Notes (Signed)
PROVIDER NOTE: Information contained herein reflects review and annotations entered in association with encounter. Interpretation of such information and data should be left to medically-trained personnel. Information provided to patient can be located elsewhere in the medical record under "Patient Instructions". Document created using STT-dictation technology, any transcriptional errors that may result from process are unintentional.    Patient: Judy Erickson  Service Category: Procedure  Provider: Gillis Santa, MD  DOB: 06-11-1950  DOS: 05/15/2020  Location: [facility]  MRN: [id number]  Setting: Ambulatory - outpatient  Referring Provider: Verdie Shire, MD  Type: Established Patient  Specialty: Interventional Pain Management  PCP: Verdie Shire, MD   Primary Reason for Visit: Interventional Pain Management Treatment. CC: Low back, right leg pain  Procedure:          Anesthesia, Analgesia, Anxiolysis:  Type: Diagnostic Inter-Laminar Epidural Steroid Injection  #1  Region: Lumbar Level: L4-5 Level. Laterality: Right-Sided         Type: Local Anesthesia with 2.5 mg PO Valium  Local Anesthetic: Lidocaine 1-2%  Position: Prone with head of the table was raised to facilitate breathing.   Indications: 1. 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 Spinal stenosis, lumbar region, with neurogenic claudication   3. Lumbar radiculopathy   4. Chronic pain syndrome    Pain Score: Pre-procedure: 8 /10 Post-procedure: 0-No pain/10   Pre-op H&P Assessment:  Ms. Mancias is a 69 y.o. (year old), female patient, seen today for interventional treatment. She  has a past surgical history that includes Abdominal hysterectomy; Cholecystectomy; Hernia repair; Colonoscopy with propofol (N/A, 08/28/2015); Esophagogastroduodenoscopy (egd) with propofol (N/A, 08/28/2015); and Esophagogastroduodenoscopy (N/A, 08/02/2016). Ms. Hassey has a current medication list which includes the following  prescription(s): acetaminophen, albuterol, albuterol, bupropion, buspirone, cimetidine, clobetasol ointment, dexlansoprazole, dicyclomine, breo ellipta, gabapentin, hydrochlorothiazide, hydrocodone-homatropine, ipratropium, levalbuterol, linaclotide, lithium carbonate, lorazepam, losartan, meclizine, methimazole, ondansetron, prednisone, daliresp, sucralfate, tiotropium, vitamin b-12, vitamin d (cholecalciferol), folic acid, and methotrexate. Her primarily concern today is the No chief complaint on file.  Initial Vital Signs:  Pulse/HCG Rate: 98  Temp: (!) 97.2 F (36.2 C) Resp: 18 BP: (!) 161/77 SpO2: 100 % (on 3L O2 from home)  BMI: Estimated body mass index is 27.46 kg/m as calculated from the following:   Height as of this encounter: 5\' 4"  (1.626 m).   Weight as of this encounter: 160 lb (72.6 kg).  Risk Assessment: Allergies: Reviewed. She is allergic to aspirin, atorvastatin, gabapentin, nsaids, promethazine hcl, sulfa antibiotics, sulfamethoxazole, and trimethoprim.  Allergy Precautions: None required Coagulopathies: Reviewed. None identified.  Blood-thinner therapy: None at this time Active Infection(s): Reviewed. None identified. Ms. Feeser is afebrile  Site Confirmation: Ms. Cancio was asked to confirm the procedure and laterality before marking the site Procedure checklist: Completed Consent: Before the procedure and under the influence of no sedative(s), amnesic(s), or anxiolytics, the patient was informed of the treatment options, risks and possible complications. To fulfill our ethical and legal obligations, as recommended by the American Medical Association's Code of Ethics, I have informed the patient of my clinical impression; the nature and purpose of the treatment or procedure; the risks, benefits, and possible complications of the intervention; the alternatives, including doing nothing; the risk(s) and benefit(s) of the alternative treatment(s) or procedure(s); and the  risk(s) and benefit(s) of doing nothing. The patient was provided information about the general risks and possible complications associated with the procedure. These may include, but are not limited to: failure to achieve desired goals,  infection, bleeding, organ or nerve damage, allergic reactions, paralysis, and death. In addition, the patient was informed of those risks and complications associated to Spine-related procedures, such as failure to decrease pain; infection (i.e.: Meningitis, epidural or intraspinal abscess); bleeding (i.e.: epidural hematoma, subarachnoid hemorrhage, or any other type of intraspinal or peri-dural bleeding); organ or nerve damage (i.e.: Any type of peripheral nerve, nerve root, or spinal cord injury) with subsequent damage to sensory, motor, and/or autonomic systems, resulting in permanent pain, numbness, and/or weakness of one or several areas of the body; allergic reactions; (i.e.: anaphylactic reaction); and/or death. Furthermore, the patient was informed of those risks and complications associated with the medications. These include, but are not limited to: allergic reactions (i.e.: anaphylactic or anaphylactoid reaction(s)); adrenal axis suppression; blood sugar elevation that in diabetics may result in ketoacidosis or comma; water retention that in patients with history of congestive heart failure may result in shortness of breath, pulmonary edema, and decompensation with resultant heart failure; weight gain; swelling or edema; medication-induced neural toxicity; particulate matter embolism and blood vessel occlusion with resultant organ, and/or nervous system infarction; and/or aseptic necrosis of one or more joints. Finally, the patient was informed that Medicine is not an exact science; therefore, there is also the possibility of unforeseen or unpredictable risks and/or possible complications that may result in a catastrophic outcome. The patient indicated having  understood very clearly. We have given the patient no guarantees and we have made no promises. Enough time was given to the patient to ask questions, all of which were answered to the patient's satisfaction. Ms. Wrobleski has indicated that she wanted to continue with the procedure. Attestation: I, the ordering provider, attest that I have discussed with the patient the benefits, risks, side-effects, alternatives, likelihood of achieving goals, and potential problems during recovery for the procedure that I have provided informed consent. Date  Time: 05/15/2020  9:38 AM  Pre-Procedure Preparation:  Monitoring: As per clinic protocol. Respiration, ETCO2, SpO2, BP, heart rate and rhythm monitor placed and checked for adequate function Safety Precautions: Patient was assessed for positional comfort and pressure points before starting the procedure. Time-out: I initiated and conducted the "Time-out" before starting the procedure, as per protocol. The patient was asked to participate by confirming the accuracy of the "Time Out" information. Verification of the correct person, site, and procedure were performed and confirmed by me, the nursing staff, and the patient. "Time-out" conducted as per Joint Commission's Universal Protocol (UP.01.01.01). Time: 1052  Description of Procedure:          Target Area: The interlaminar space, initially targeting the lower laminar border of the superior vertebral body. Approach: Paramedial approach. Area Prepped: Entire Posterior Lumbar Region DuraPrep (Iodine Povacrylex [0.7% available iodine] and Isopropyl Alcohol, 74% w/w) Safety Precautions: Aspiration looking for blood return was conducted prior to all injections. At no point did we inject any substances, as a needle was being advanced. No attempts were made at seeking any paresthesias. Safe injection practices and needle disposal techniques used. Medications properly checked for expiration dates. SDV (single dose vial)  medications used. Description of the Procedure: Protocol guidelines were followed. The procedure needle was introduced through the skin, ipsilateral to the reported pain, and advanced to the target area. Bone was contacted and the needle walked caudad, until the lamina was cleared. The epidural space was identified using "loss-of-resistance technique" with 2-3 ml of PF-NaCl (0.9% NSS), in a 5cc LOR glass syringe.  Vitals:   05/15/20 1005 05/15/20 1009  05/15/20 1052 05/15/20 1055  BP: (!) 161/77 (!) 158/77 (!) 169/90 (!) 180/87  Pulse: 98  (!) 105 (!) 105  Resp: 18  16 18   Temp: (!) 97.2 F (36.2 C)     TempSrc: Temporal     SpO2: 100%  96% 100%  Weight: 160 lb (72.6 kg)     Height: 5\' 4"  (1.626 m)       Start Time: 1052 hrs. End Time: 1055 hrs.  Materials:  Needle(s) Type: Epidural needle Gauge: 22G Length: 3.5-in Medication(s): Please see orders for medications and dosing details. 6 cc solution made of 3 cc of preservative-free saline, 2 cc of 0.2% ropivacaine, 1 cc of Decadron 10 mg/cc.  Imaging Guidance (Spinal):          Type of Imaging Technique: Fluoroscopy Guidance (Spinal) Indication(s): Assistance in needle guidance and placement for procedures requiring needle placement in or near specific anatomical locations not easily accessible without such assistance. Exposure Time: Please see nurses notes. Contrast: Before injecting any contrast, we confirmed that the patient did not have an allergy to iodine, shellfish, or radiological contrast. Once satisfactory needle placement was completed at the desired level, radiological contrast was injected. Contrast injected under live fluoroscopy. No contrast complications. See chart for type and volume of contrast used. Fluoroscopic Guidance: I was personally present during the use of fluoroscopy. "Tunnel Vision Technique" used to obtain the best possible view of the target area. Parallax error corrected before commencing the procedure.  "Direction-depth-direction" technique used to introduce the needle under continuous pulsed fluoroscopy. Once target was reached, antero-posterior, oblique, and lateral fluoroscopic projection used confirm needle placement in all planes. Images permanently stored in EMR. Interpretation: I personally interpreted the imaging intraoperatively. Adequate needle placement confirmed in multiple planes. Appropriate spread of contrast into desired area was observed. No evidence of afferent or efferent intravascular uptake. No intrathecal or subarachnoid spread observed. Permanent images saved into the patient's record.  Antibiotic Prophylaxis:   Anti-infectives (From admission, onward)   None     Indication(s): None identified  Post-operative Assessment:  Post-procedure Vital Signs:  Pulse/HCG Rate: (!) 105  Temp: (!) 97.2 F (36.2 C) Resp: 18 BP: (!) 180/87 SpO2: 100 %  EBL: None  Complications: No immediate post-treatment complications observed by team, or reported by patient.  Note: The patient tolerated the entire procedure well. A repeat set of vitals were taken after the procedure and the patient was kept under observation following institutional policy, for this type of procedure. Post-procedural neurological assessment was performed, showing return to baseline, prior to discharge. The patient was provided with post-procedure discharge instructions, including a section on how to identify potential problems. Should any problems arise concerning this procedure, the patient was given instructions to immediately contact us, at any time, without hesitation. In any case, we plan to contact the patient by telephone for a follow-up status report regarding this interventional procedure.  Comments:  No additional relevant information.  Plan of Care  Orders:  Orders Placed This Encounter  Procedures  . DG PAIN [facility] C-ARM 1-60 MIN NO REPORT    Intraoperative interpretation by procedural physician  at [facility].    Standing Status:   Standing    Number of Occurrences:   1    Order Specific Question:   Reason for exam:    Answer:   Assistance in needle guidance and placement for procedures requiring needle placement in or near specific anatomical locations not easily accessible without such assistance.  Medications ordered for procedure: Meds ordered this encounter  Medications  . diazepam (VALIUM) tablet 2.5 mg  . iohexol (OMNIPAQUE) 180 MG/ML injection 10 mL    Must be Myelogram-compatible. If not available, you may substitute with a water-soluble, non-ionic, hypoallergenic, myelogram-compatible radiological contrast medium.  Marland Kitchen lidocaine (XYLOCAINE) 2 % (with pres) injection 400 mg  . ropivacaine (PF) 2 mg/mL (0.2%) (NAROPIN) injection 2 mL  . sodium chloride flush (NS) 0.9 % injection 2 mL  . dexamethasone (DECADRON) injection 10 mg   Medications administered: We administered diazepam, iohexol, lidocaine, ropivacaine (PF) 2 mg/mL (0.2%), sodium chloride flush, and dexamethasone.  See the medical record for exact dosing, route, and time of administration.  Follow-up plan:   Return in about 5 weeks (around 06/19/2020) for Post Procedure Evaluation, virtual.      RIGHT L4/5 ESI 05/15/2020 (6cc)   Recent Visits Date Type Provider Dept  04/25/20 Office Visit Gillis Santa, MD [facility]  Showing recent visits within past 90 days and meeting all other requirements Today's Visits Date Type Provider Dept  05/15/20 Procedure visit Gillis Santa, MD [facility]  Showing today's visits and meeting all other requirements Future Appointments Date Type Provider Dept  06/19/20 Appointment Gillis Santa, MD [facility]  Showing future appointments within next 90 days and meeting all other requirements  Disposition: Discharge home  Discharge (Date  Time): 05/15/2020; 1105 hrs.   Primary Care Physician: Verdie Shire, MD Location: [facility]  Outpatient Pain Management Facility Note by: Gillis Santa, MD Date: 05/15/2020; Time: 11:14 AM  Disclaimer:  Medicine is not an exact science. The only guarantee in medicine is that nothing is guaranteed. It is important to note that the decision to proceed with this intervention was based on the information collected from the patient. The Data and conclusions were drawn from the patient's questionnaire, the interview, and the physical examination. Because the information was provided in large part by the patient, it cannot be guaranteed that it has not been purposely or unconsciously manipulated. Every effort has been made to obtain as much relevant data as possible for this evaluation. It is important to note that the conclusions that lead to this procedure are derived in large part from the available data. Always take into account that the treatment will also be dependent on availability of resources and existing treatment guidelines, considered by other Pain Management Practitioners as being common knowledge and practice, at the time of the intervention. For Medico-Legal purposes, it is also important to point out that variation in procedural techniques and pharmacological choices are the acceptable norm. The indications, contraindications, technique, and results of the above procedure should only be interpreted and judged by a Board-Certified Interventional Pain Specialist with extensive familiarity and expertise in the same exact procedure and technique.

## 2020-05-15 NOTE — Progress Notes (Signed)
Safety precautions to be maintained throughout the outpatient stay will include: orient to surroundings, keep bed in low position, maintain call bell within reach at all times, provide assistance with transfer out of bed and ambulation.  

## 2020-05-18 ENCOUNTER — Encounter: Admit: 2020-05-18 | Discharge: 2020-05-19 | Payer: MEDICARE

## 2020-05-18 DIAGNOSIS — G8929 Other chronic pain: Principal | ICD-10-CM

## 2020-05-18 DIAGNOSIS — M545 Chronic bilateral low back pain without sciatica: Principal | ICD-10-CM

## 2020-05-18 DIAGNOSIS — J449 Chronic obstructive pulmonary disease, unspecified: Principal | ICD-10-CM

## 2020-05-18 DIAGNOSIS — M542 Cervicalgia: Principal | ICD-10-CM

## 2020-05-22 MED ORDER — POLYETHYLENE GLYCOL 3350 17 GRAM/DOSE ORAL POWDER
12 refills | 0 days
Start: 2020-05-22 — End: ?

## 2020-05-23 ENCOUNTER — Telehealth: Payer: Self-pay | Admitting: Student in an Organized Health Care Education/Training Program

## 2020-05-23 NOTE — Telephone Encounter (Signed)
Attempted to call patient, voicemail not set up

## 2020-05-23 NOTE — Telephone Encounter (Signed)
Can consider steroid taper. If she would like that, I can send that in.

## 2020-05-23 NOTE — Telephone Encounter (Signed)
Patient states she has not gotten any relief since procedure on 05-15-20  She is having pain in lower back into legs, also upper back is hurting down into left arm most, but also right, down into her hands. Her follow up is 06-20-19. Please call patient and advise what she can do or is there something we can do

## 2020-05-23 NOTE — Telephone Encounter (Signed)
Had LESI on 05-15-20.

## 2020-05-24 NOTE — Telephone Encounter (Signed)
Attempted to call patient, voicemail not set up

## 2020-05-25 NOTE — Telephone Encounter (Signed)
Attempted to call again, voicemail not set up.

## 2020-06-08 ENCOUNTER — Encounter: Admit: 2020-06-08 | Discharge: 2020-06-09 | Payer: MEDICARE

## 2020-06-08 DIAGNOSIS — M542 Cervicalgia: Principal | ICD-10-CM

## 2020-06-08 DIAGNOSIS — M62838 Other muscle spasm: Principal | ICD-10-CM

## 2020-06-15 ENCOUNTER — Encounter: Payer: Self-pay | Admitting: Student in an Organized Health Care Education/Training Program

## 2020-06-16 DIAGNOSIS — M542 Cervicalgia: Principal | ICD-10-CM

## 2020-06-19 ENCOUNTER — Ambulatory Visit
Payer: Medicare Other | Attending: Student in an Organized Health Care Education/Training Program | Admitting: Student in an Organized Health Care Education/Training Program

## 2020-06-19 ENCOUNTER — Encounter: Payer: Self-pay | Admitting: Student in an Organized Health Care Education/Training Program

## 2020-06-19 ENCOUNTER — Other Ambulatory Visit: Payer: Self-pay

## 2020-06-19 DIAGNOSIS — G5793 Unspecified mononeuropathy of bilateral lower limbs: Secondary | ICD-10-CM | POA: Diagnosis not present

## 2020-06-19 DIAGNOSIS — M5416 Radiculopathy, lumbar region: Secondary | ICD-10-CM | POA: Diagnosis not present

## 2020-06-19 DIAGNOSIS — M47816 Spondylosis without myelopathy or radiculopathy, lumbar region: Secondary | ICD-10-CM | POA: Diagnosis not present

## 2020-06-19 DIAGNOSIS — G894 Chronic pain syndrome: Secondary | ICD-10-CM

## 2020-06-19 DIAGNOSIS — M48062 Spinal stenosis, lumbar region with neurogenic claudication: Secondary | ICD-10-CM

## 2020-06-19 DIAGNOSIS — G608 Other hereditary and idiopathic neuropathies: Secondary | ICD-10-CM

## 2020-06-19 DIAGNOSIS — G8929 Other chronic pain: Secondary | ICD-10-CM

## 2020-06-19 MED ORDER — TRAMADOL HCL 50 MG PO TABS: 50.0000 mg | ORAL_TABLET | Freq: Two times a day (BID) | ORAL | 0 refills | Status: AC | PRN

## 2020-06-19 NOTE — Progress Notes (Signed)
Patient: Judy Erickson  Service Category: E/M  Provider: Gillis Santa, MD  DOB: 05/22/1951  DOS: 06/19/2020  Location: Office  MRN: [id number]  Setting: Ambulatory outpatient  Referring Provider: Verdie Shire, MD  Type: Established Patient  Specialty: Interventional Pain Management  PCP: Verdie Shire, MD  Location: Home  Delivery: TeleHealth     Virtual Encounter - Pain Management PROVIDER NOTE: Information contained herein reflects review and annotations entered in association with encounter. Interpretation of such information and data should be left to medically-trained personnel. Information provided to patient can be located elsewhere in the medical record under "Patient Instructions". Document created using STT-dictation technology, any transcriptional errors that may result from process are unintentional.    Contact & Pharmacy Preferred: [phone] Home: [phone] (home) Mobile: [phone] (mobile) E-mail: No e-mail address on record  [place] [place] [address] [facility] [place] [address] Phone: [phone] Fax: [phone]   Pre-screening  Judy Erickson offered "in-person" vs "virtual" encounter. She indicated preferring virtual for this encounter.   Reason COVID-19*  Social distancing based on CDC and AMA recommendations.   I contacted Judy Erickson on 06/19/2020 via video conference.      I clearly identified myself as Gillis Santa, MD. I verified that I was speaking with the correct person using two identifiers (Name: Judy Erickson, and date of birth: 1950/07/29).  Consent I sought verbal advanced consent from Judy Erickson for virtual visit interactions. I informed Judy Erickson of possible security and privacy concerns, risks, and limitations associated with providing "not-in-person" medical evaluation and management services. I also informed Judy Erickson of the availability of "in-person" appointments. Finally,  I informed her that there would be a charge for the virtual visit and that she could be  personally, fully or partially, financially responsible for it. Judy Erickson expressed understanding and agreed to proceed.   Historic Elements   Judy Erickson is a 70 y.o. year old, female patient evaluated today after our last contact on 05/23/2020. Judy Erickson  has a past medical history of Abdominal hernia, Allergic rhinitis due to allergen, Arthritis, Asthma, Bell's palsy, Bipolar disorder ([place]), COPD (chronic obstructive pulmonary disease) ([place]), Depression, Eczema, Genital warts, Genital warts, GERD (gastroesophageal reflux disease), Headache, History of migraine headaches, Hypertension, Osteoporosis, Pulmonary nodules, Seizures ([place]), Sleep apnea, Thyroid disease, Tobacco use disorder, Tremor, Vitamin B12 deficiency, and Vitamin D deficiency. She also  has a past surgical history that includes Abdominal hysterectomy; Cholecystectomy; Hernia repair; Colonoscopy with propofol (N/A, 08/28/2015); Esophagogastroduodenoscopy (egd) with propofol (N/A, 08/28/2015); and Esophagogastroduodenoscopy (N/A, 08/02/2016). Judy Erickson has a current medication list which includes the following prescription(s): acetaminophen, albuterol, albuterol, bupropion, buspirone, cimetidine, clobetasol ointment, dexlansoprazole, dicyclomine, breo ellipta, gabapentin, hydrochlorothiazide, hydrocodone-homatropine, ipratropium, levalbuterol, linaclotide, lorazepam, losartan, meclizine, methimazole, ondansetron, prednisone, daliresp, sucralfate, tiotropium, tramadol, vitamin b-12, and vitamin d (cholecalciferol). She  reports that she has quit smoking. She has never used smokeless tobacco. She reports that she does not drink alcohol and does not use drugs. Judy Erickson is allergic to aspirin, atorvastatin, nsaids, promethazine hcl, and sulfa antibiotics.   HPI  Today, she is being contacted for a post-procedure assessment.    Post-Procedure  Evaluation  Procedure (05/23/2020):   Type: Diagnostic Inter-Laminar Epidural Steroid Injection  #1  Region: Lumbar Level: L4-5 Level. Laterality: Right-Sided        Sedation: Please see nurses note.  Effectiveness during initial hour after procedure(Ultra-Short Term Relief): 100 %  Local anesthetic used: Long-acting (4-6 hours) Effectiveness: Defined as any analgesic benefit obtained secondary to the administration of local anesthetics. This carries significant diagnostic value as to the etiological location, or anatomical origin, of the pain. Duration of benefit is expected to coincide with the duration of the local anesthetic used.  Effectiveness during initial 4-6 hours after procedure(Short-Term Relief): 100 %   Long-term benefit: Defined as any relief past the pharmacologic duration of the local anesthetics.  Effectiveness past the initial 6 hours after procedure(Long-Term Relief): 0 %   Current benefits: Defined as benefit that persist at this time.   Analgesia:  No benefit Function: No benefit ROM: No benefit    Assessment  The primary encounter diagnosis was Lumbar facet arthropathy. 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  Plan of Care  Judy Erickson has a current medication list which includes the following long-term medication(s): albuterol, albuterol, bupropion, cimetidine, dexlansoprazole, dicyclomine, gabapentin, hydrochlorothiazide, ipratropium, levalbuterol, linaclotide, losartan, methimazole, daliresp, sucralfate, and tiotropium.   Judy Erickson has a history of greater than 3 months of moderate to severe pain which is resulted in functional impairment.  The patient has tried various conservative therapeutic  options such as NSAIDs, Tylenol, muscle relaxants, physical therapy which was inadequately effective.  Patient's pain is predominantly axial with MRI and clinical exam findings suggestive of facet arthropathy. Lumbar facet medial branch nerve blocks were discussed with the patient.  Risks and benefits were reviewed.  Patient will think about this further and let us know if she would like to proceed with bilateral L3, L4, L5 medial branch nerve block.  As needed order placed.   In regards to medication management, patient requesting something stronger for pain control.  She utilizes acetaminophen 500 mg 4 times daily as needed.  She is on Wellbutrin 200 mg daily.  She also utilizes gabapentin 300 mg 3 times a day.  Higher doses have worsened her confusion and made her sedated.  We will send in prescription for tramadol that patient can take 50 mg twice daily as needed.  PMP checked and reviewed.  Previous opioid fill was on 01/24/2020 hydrocodone 5 mg quantity 15 which has lasted the patient many months.  Pharmacotherapy (Medications Ordered): Meds ordered this encounter  Medications  . traMADol (ULTRAM) 50 MG tablet    Sig: Take 1 tablet (50 mg total) by mouth every 12 (twelve) hours as needed for severe pain. Month last 30 days.    Dispense:  60 tablet    Refill:  0    [place] STOP ACT - Not applicable. Fill one day early if pharmacy is closed on scheduled refill date.   Orders:  Orders Placed This Encounter  Procedures  . LUMBAR FACET(MEDIAL BRANCH NERVE BLOCK) MBNB    Scheduling timeframe: (PRN procedure) Ms. Danziger will call when needed. Clinical indication: Axial low back pain. Lumbosacral Spondylosis (M47.897).  Sedation: Usually done with sedation. (May be done without sedation if so desired by patient.) Requirements: NPO x 8 hrs.; Driver; Stop blood thinners. Interval: No sooner than two weeks for diagnostic or therapeutic. No sooner than every other month for palliative.    Standing Status:    Standing    Number of Occurrences:   5    Standing Expiration Date:   06/19/2021  Scheduling Instructions:     Procedure: Lumbar facet block (AKA.: Lumbosacral medial branch nerve block)     Level: L3-4, L4-5, & L5-S1 Facets (L3, L4, L5, Medial Branch Nerves)     Laterality: Bilateral    Order Specific Question:   Where will this procedure be performed?    Answer:   [facility]   Follow-up plan:   Return if symptoms worsen or fail to improve.     RIGHT L4/5 ESI 05/15/2020 (6cc): Not helpful, as needed order placed for diagnostic lumbar facets given severe facet arthrosis noted on lumbar MRI and clinical findings of severe low back pain with facet loading.    Recent Visits Date Type Provider Dept  05/15/20 Procedure visit Gillis Santa, MD [facility]  04/25/20 Office Visit Gillis Santa, MD [facility]  Showing recent visits within past 90 days and meeting all other requirements Today's Visits Date Type Provider Dept  06/19/20 Telemedicine Gillis Santa, MD [facility]  Showing today's visits and meeting all other requirements Future Appointments No visits were found meeting these conditions. Showing future appointments within next 90 days and meeting all other requirements  I discussed the assessment and treatment plan with the patient. The patient was provided an opportunity to ask questions and all were answered. The patient agreed with the plan and demonstrated an understanding of the instructions.  Patient advised to call back or seek an in-person evaluation if the symptoms or condition worsens.  Duration of encounter: 30 minutes.  Note by: Gillis Santa, MD Date: 06/19/2020; Time: 1:12 PM

## 2020-06-20 ENCOUNTER — Telehealth: Payer: Self-pay

## 2020-06-20 NOTE — Telephone Encounter (Signed)
done

## 2020-06-20 NOTE — Telephone Encounter (Signed)
She called back after speaking to the pharmacy and they told her it was due to her insurance that they could only give her 14 days and it would need prior authorization to get a full 30 days. Can that be done?

## 2020-06-20 NOTE — Telephone Encounter (Signed)
Patient notified

## 2020-06-20 NOTE — Telephone Encounter (Signed)
She only got 14 pills to last 30 days. She doesn't understand how she is supposed to go 30 days with only 14 pills. Please call and explain.

## 2020-06-30 DIAGNOSIS — K21 Gastroesophageal reflux disease with esophagitis without hemorrhage: Principal | ICD-10-CM

## 2020-06-30 DIAGNOSIS — K5900 Constipation, unspecified: Principal | ICD-10-CM

## 2020-06-30 DIAGNOSIS — K581 Irritable bowel syndrome with constipation: Principal | ICD-10-CM

## 2020-06-30 DIAGNOSIS — D126 Benign neoplasm of colon, unspecified: Principal | ICD-10-CM

## 2020-06-30 DIAGNOSIS — K31A Gastritis with intestinal metaplasia of stomach: Principal | ICD-10-CM

## 2020-06-30 DIAGNOSIS — K2970 Gastritis, unspecified, without bleeding: Principal | ICD-10-CM

## 2020-07-14 ENCOUNTER — Encounter: Admit: 2020-07-14 | Discharge: 2020-07-15 | Payer: MEDICARE

## 2020-07-14 DIAGNOSIS — Z20822 Contact with and (suspected) exposure to covid-19: Principal | ICD-10-CM

## 2020-07-14 DIAGNOSIS — R269 Unspecified abnormalities of gait and mobility: Principal | ICD-10-CM

## 2020-07-14 DIAGNOSIS — I10 Essential (primary) hypertension: Principal | ICD-10-CM

## 2020-07-14 DIAGNOSIS — Z79899 Other long term (current) drug therapy: Principal | ICD-10-CM

## 2020-07-14 DIAGNOSIS — R42 Dizziness and giddiness: Principal | ICD-10-CM

## 2020-07-14 DIAGNOSIS — Z9181 History of falling: Principal | ICD-10-CM

## 2020-07-14 DIAGNOSIS — Z882 Allergy status to sulfonamides status: Principal | ICD-10-CM

## 2020-07-14 DIAGNOSIS — J441 Chronic obstructive pulmonary disease with (acute) exacerbation: Principal | ICD-10-CM

## 2020-07-14 DIAGNOSIS — M13869 Other specified arthritis, unspecified knee: Principal | ICD-10-CM

## 2020-07-14 DIAGNOSIS — F32A Depression, unspecified: Principal | ICD-10-CM

## 2020-07-14 DIAGNOSIS — E0590 Thyrotoxicosis, unspecified without thyrotoxic crisis or storm: Principal | ICD-10-CM

## 2020-07-14 DIAGNOSIS — R0602 Shortness of breath: Principal | ICD-10-CM

## 2020-07-14 DIAGNOSIS — J449 Chronic obstructive pulmonary disease, unspecified: Principal | ICD-10-CM

## 2020-07-14 DIAGNOSIS — J810 Acute pulmonary edema: Principal | ICD-10-CM

## 2020-07-14 DIAGNOSIS — F419 Anxiety disorder, unspecified: Principal | ICD-10-CM

## 2020-07-14 DIAGNOSIS — F17210 Nicotine dependence, cigarettes, uncomplicated: Principal | ICD-10-CM

## 2020-07-14 DIAGNOSIS — K219 Gastro-esophageal reflux disease without esophagitis: Principal | ICD-10-CM

## 2020-07-14 DIAGNOSIS — Z5941 Food insecurity: Principal | ICD-10-CM

## 2020-07-15 MED ORDER — ROFLUMILAST 250 MCG TABLET
ORAL_TABLET | Freq: Every day | ORAL | 0 refills | 30.00000 days | Status: CP
Start: 2020-07-15 — End: 2020-08-14

## 2020-07-15 MED ORDER — DOXYCYCLINE HYCLATE 100 MG CAPSULE
ORAL_CAPSULE | Freq: Two times a day (BID) | ORAL | 0 refills | 10 days | Status: CP
Start: 2020-07-15 — End: 2020-07-25

## 2020-07-16 MED ORDER — PREDNISONE 20 MG TABLET
ORAL_TABLET | Freq: Every day | ORAL | 0 refills | 2 days | Status: CP
Start: 2020-07-16 — End: ?

## 2020-07-25 ENCOUNTER — Encounter: Admit: 2020-07-25 | Discharge: 2020-07-26 | Payer: MEDICARE

## 2020-07-25 DIAGNOSIS — J810 Acute pulmonary edema: Principal | ICD-10-CM

## 2020-08-01 ENCOUNTER — Encounter: Payer: Self-pay | Admitting: Internal Medicine

## 2020-08-01 ENCOUNTER — Ambulatory Visit (INDEPENDENT_AMBULATORY_CARE_PROVIDER_SITE_OTHER): Payer: Medicare Other | Admitting: Internal Medicine

## 2020-08-01 ENCOUNTER — Other Ambulatory Visit: Payer: Self-pay

## 2020-08-01 VITALS — BP 132/60 | HR 94 | Temp 97.1°F | Ht 64.0 in | Wt 153.8 lb

## 2020-08-01 DIAGNOSIS — J449 Chronic obstructive pulmonary disease, unspecified: Secondary | ICD-10-CM | POA: Diagnosis not present

## 2020-08-01 DIAGNOSIS — F17210 Nicotine dependence, cigarettes, uncomplicated: Secondary | ICD-10-CM | POA: Diagnosis not present

## 2020-08-01 DIAGNOSIS — J301 Allergic rhinitis due to pollen: Secondary | ICD-10-CM

## 2020-08-01 DIAGNOSIS — J9611 Chronic respiratory failure with hypoxia: Secondary | ICD-10-CM

## 2020-08-01 DIAGNOSIS — F17200 Nicotine dependence, unspecified, uncomplicated: Secondary | ICD-10-CM

## 2020-08-01 DIAGNOSIS — R0789 Other chest pain: Secondary | ICD-10-CM

## 2020-08-01 MED ORDER — PREDNISONE 20 MG PO TABS: 20.0000 mg | ORAL_TABLET | Freq: Every day | ORAL | 1 refills | Status: AC

## 2020-08-01 MED ORDER — CETIRIZINE HCL 10 MG PO TABS: 10.0000 mg | ORAL_TABLET | Freq: Every day | ORAL | 6 refills | Status: AC

## 2020-08-01 MED ORDER — FLUTICASONE PROPIONATE 50 MCG/ACT NA SUSP: 1.0000 | Freq: Every day | NASAL | 2 refills | Status: AC

## 2020-08-01 NOTE — Patient Instructions (Addendum)
Use albuterol nebulizer every 4 hours and then use flutter valve and incentive spirometry breathing toys after every nebulizer therapy  Start prednisone 20 mg daily for the next 14 days  Continue Spiriva and continue Breo as prescribed Avoid cats  Please stop smoking  Zyrtec 10 mg daily at nighttime Flonase 2 sprays each nostril daily   Continue oxygen as prescribed  Recommend lung cancer screening referral program

## 2020-08-01 NOTE — Progress Notes (Signed)
Name: Judy Erickson MRN: [id number] DOB: 09-24-1950     CONSULTATION DATE: 08/01/2020   CHIEF COMPLAINT: I have SOB  STUDIES:     CT chest Independently reviewed by Me today from  08/2019   Moderate centrilobular emphysema. There is minimal peripheral reticular interstitial opacity and some ground-glass in the preserved airspaces, particularly in the bilateral lower lungs. No significant air trapping on expiratory phase imaging. No pleural effusion or pneumothorax.     HISTORY OF PRESENT ILLNESS: 70 year old white female seen today for progressive shortness of breath Patient with a diagnosis of COPD for the last 15 years Patient also diagnosis of chronic respiratory failure in the setting of COPD Has been on oxygen for many many years Patient has recent diagnosis of COPD exacerbation and was admitted to the hospital on February 4 Patient was discharged with antibiotics and prednisone At this time patient has increased work of breathing shortness of breath positive wheezing cough Patient still actively smokes  Patient feels very weak very fatigued however is not in any impending respiratory failure Patient continues to use oxygen She states her inhalers do help her Has been on chronic prednisone therapy for many years at 5 mg daily Patient is exposed to cats and says that she is allergic to them Avoidance of cats is highly recommended  Patient is unable to attend pulmonary rehabilitation services however a physical therapist did come out to her place to assess her situation and advised breathing exercises Patient has a chronic cough and chronic mucus production but does not use flutter valve or incentive spirometry  Smoking Assessment and Cessation Counseling Upon further questioning, Patient smokes 1 ppd I have advised patient to quit/stop smoking as soon as possible due to high risk for multiple medical problems  Patient is NOT willing to quit smoking  I have advised  patient that we can assist and have options of Nicotine replacement therapy. I also advised patient on behavioral therapy and can provide oral medication therapy in conjunction with the other therapies Follow up next Office visit  for assessment of smoking cessation Smoking cessation counseling advised for 4 minutes     PAST MEDICAL HISTORY :   has a past medical history of Abdominal hernia, Allergic rhinitis due to allergen, Arthritis, Asthma, Bell's palsy, Bipolar disorder ([place]), COPD (chronic obstructive pulmonary disease) ([place]), Depression, Eczema, Genital warts, Genital warts, GERD (gastroesophageal reflux disease), Headache, History of migraine headaches, Hypertension, Osteoporosis, Pulmonary nodules, Seizures ([place]), Sleep apnea, Thyroid disease, Tobacco use disorder, Tremor, Vitamin B12 deficiency, and Vitamin D deficiency.  has a past surgical history that includes Abdominal hysterectomy; Cholecystectomy; Hernia repair; Colonoscopy with propofol (N/A, 08/28/2015); Esophagogastroduodenoscopy (egd) with propofol (N/A, 08/28/2015); and Esophagogastroduodenoscopy (N/A, 08/02/2016). Prior to Admission medications   Medication Sig Start Date End Date Taking? Authorizing Provider  acetaminophen (TYLENOL) 500 MG tablet Take 1,000 mg by mouth.   Yes [provider]  albuterol (PROVENTIL) (2.5 MG/3ML) 0.083% nebulizer solution Take 2.5 mg by nebulization every 6 (six) hours as needed for wheezing or shortness of breath.   Yes [provider]  albuterol (VENTOLIN HFA) 108 (90 Base) MCG/ACT inhaler Inhale into the lungs every 6 (six) hours as needed for wheezing or shortness of breath.   Yes [provider]  buPROPion (WELLBUTRIN XL) 300 MG 24 hr tablet Take 300 mg by mouth daily.   Yes [provider]  busPIRone (BUSPAR) 5 MG tablet Take 5 mg by mouth 2 (two) times daily. 03/02/20  Yes [provider]  cimetidine (TAGAMET) 400 MG tablet Take 400 mg by mouth at  bedtime. 04/17/20  Yes [provider]  clobetasol ointment (TEMOVATE) 5.17 % Apply 1 application topically 2 (two) times daily.   Yes [provider]  dexlansoprazole (DEXILANT) 60 MG capsule Take 60 mg by mouth daily.   Yes [provider]  dicyclomine (BENTYL) 20 MG tablet Take 20 mg by mouth every 6 (six) hours.   Yes [provider]  fluticasone furoate-vilanterol (BREO ELLIPTA) 100-25 MCG/INH AEPB INHALE 1 INHALATION INTO THE LUNGS ONCE DAILY 04/17/20  Yes [provider]  gabapentin (NEURONTIN) 300 MG capsule Take 300 mg by mouth 3 (three) times daily.   Yes [provider]  hydrochlorothiazide (HYDRODIURIL) 12.5 MG tablet Take 12.5 mg by mouth daily. 04/17/20  Yes [provider]  HYDROcodone-homatropine (HYCODAN) 5-1.5 MG/5ML syrup Take by mouth. 08/14/18  Yes [provider]  ipratropium (ATROVENT) 0.06 % nasal spray Place 2 sprays into both nostrils 4 (four) times daily.   Yes [provider]  levalbuterol Penne Lash) 0.63 MG/3ML nebulizer solution Inhale into the lungs. 09/23/19  Yes [provider]  Linaclotide (LINZESS) 145 MCG CAPS capsule Take 145 mcg by mouth daily.   Yes [provider]  LORazepam (ATIVAN) 1 MG tablet Take 1 mg by mouth every 8 (eight) hours. Take 1/2 tablet by mouth three times a day as needed   Yes [provider]  losartan (COZAAR) 100 MG tablet Take 100 mg by mouth daily.   Yes [provider]  meclizine (ANTIVERT) 12.5 MG tablet Take 12.5 mg by mouth 3 (three) times daily as needed. 11/15/19  Yes [provider]  methimazole (TAPAZOLE) 5 MG tablet Take 2.5 mg by mouth daily. 04/17/20  Yes [provider]  ondansetron (ZOFRAN) 4 MG tablet Take 4 mg by mouth every 8 (eight) hours as needed for nausea or vomiting.   Yes [provider]  predniSONE (DELTASONE) 5 MG tablet Take 5 mg by mouth daily. 04/17/20  Yes [provider]   Roflumilast (DALIRESP) 250 MCG TABS Take by mouth.   Yes [provider]  sucralfate (CARAFATE) 1 g tablet Take 1 g by mouth 4 (four) times daily -  with meals and at bedtime.   Yes [provider]  tiotropium (SPIRIVA) 18 MCG inhalation capsule Place 18 mcg into inhaler and inhale daily.   Yes [provider]  vitamin B-12 (CYANOCOBALAMIN) 500 MCG tablet Take 500 mcg by mouth daily.   Yes [provider]  Vitamin D, Cholecalciferol, 1000 units CAPS Take by mouth.   Yes [provider]   Allergies  Allergen Reactions  . Aspirin Nausea And Vomiting    PUD and nausea with aspirin and NSAIDS PUD and nausea with aspirin and NSAIDS   . Atorvastatin Other (See Comments)  . Nsaids   . Promethazine Hcl   . Sulfa Antibiotics     FAMILY HISTORY:  HTN SOCIAL HISTORY:  reports that she has quit smoking. She has never used smokeless tobacco. She reports that she does not drink alcohol and does not use drugs.    Review of Systems:  Gen:  Denies  fever, sweats, chills weigh loss  HEENT: Denies blurred vision, double vision, ear pain, eye pain, hearing loss, nose bleeds, sore throat Cardiac:  +dizziness, +chest pain or heaviness, +chest tightness,edema, No JVD Resp:   + cough, +sputum production, +shortness of breath,+wheezing, -hemoptysis,  Gi: Denies swallowing difficulty, stomach pain, nausea  or vomiting, diarrhea, constipation, bowel incontinence Gu:  Denies bladder incontinence, burning urine Ext:   Denies Joint pain, stiffness or swelling Skin: Denies  skin rash, easy bruising or bleeding or hives Endoc:  Denies polyuria, polydipsia , polyphagia or weight change Psych:   Denies depression, insomnia or hallucinations  Other:  All other systems negative    BP 132/60 (BP Location: Left Arm, Cuff Size: Normal)   Pulse 94   Temp (!) 97.1 F (36.2 C) (Temporal)   Ht 5\' 4"  (1.626 m)   Wt 153 lb 12.8 oz (69.8 kg)   SpO2 97%   BMI 26.40  kg/m     Physical Examination:   GENERAL:+ fatigue plus weakness HEAD: Normocephalic, atraumatic.  EYES: PERLA, EOMI No scleral icterus.  MOUTH: Moist mucosal membrane.  EAR, NOSE, THROAT: Clear without exudates. No external lesions.  NECK: Supple.  PULMONARY: CTA B/L +wheezing CARDIOVASCULAR: S1 and S2. Regular rate and rhythm. No murmurs GASTROINTESTINAL: Soft, nontender, nondistended. Positive bowel sounds.  MUSCULOSKELETAL: No swelling, clubbing, or edema.  NEUROLOGIC: No gross focal neurological deficits. 5/5 strength all extremities SKIN: No ulceration, lesions, rashes, or cyanosis.  PSYCHIATRIC: Insight, judgment intact. -depression -anxiety ALL OTHER ROS ARE NEGATIVE   MEDICATIONS: I have reviewed all medications and confirmed regimen as documented     ASSESSMENT AND PLAN SYNOPSIS  70 year old [place] white female seen today for extensive pulmonary history with ongoing tobacco abuse with severe end-stage pulmonary diseases with COPD and emphysema with signs and symptoms of chronic bronchitis with chronic wheezing and chronic shortness of breath in the setting of probable underlying interstitial lung disease in the setting of allergic rhinitis with very poor respiratory insufficiency and with allergic rhinitis exposed to cats, patient also is having intermittent chest pain with probable underlying coronary artery disease and needs cardiac evaluation as well  Shortness of breath and dyspnea exertion Definitely related to end-stage COPD Continue oxygen as prescribed Continue inhalers as prescribed   COPD Gold stage D Progressive shortness of breath or dyspnea exertion Continue inhalers as prescribed with Spiriva and Breo  Chronic bronchitis and emphysema-chronic cough with productive sputum Recommend albuterol nebulizer every 4 hours followed by incentive spirometry and flutter valve-  Intermittent angina with significant smoking history Recommend cardiology  referral for further assessment [facility] does not show significant heart failure symptoms does have increased pulmonary artery pressures    Overall patient has a very poor prognosis with very poor and respiratory insufficiency with progressive end-stage lung disease in the setting of 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  The importance of social distancing was discussed today. Hand Washing Techniques and avoid touching face was advised.     MEDICATION ADJUSTMENTS/LABS AND TESTS ORDERED: Use albuterol nebulizer every 4 hours and then use flutter valve and incentive spirometry breathing toys after every nebulizer therapy  Start prednisone 20 mg daily for the next 14 days  Continue Spiriva and continue Breo as prescribed Avoid cats Please stop smoking Zyrtec 10 mg daily at nighttime Flonase 2 sprays each nostril daily Continue oxygen as prescribed Recommend lung cancer screening referral program   CURRENT MEDICATIONS REVIEWED AT [place]   Patient satisfied with Plan of action and management. All questions answered  Follow up in 3 months Total  time spent 63 minutes   Corrin Parker, M.D.  [facility]  Medical Director [place] Director [facility] Cardio-Pulmonary Department

## 2020-08-11 ENCOUNTER — Ambulatory Visit (INDEPENDENT_AMBULATORY_CARE_PROVIDER_SITE_OTHER): Payer: Medicare Other | Admitting: Cardiology

## 2020-08-11 ENCOUNTER — Encounter: Payer: Self-pay | Admitting: Cardiology

## 2020-08-11 ENCOUNTER — Other Ambulatory Visit: Payer: Self-pay

## 2020-08-11 VITALS — BP 126/60 | HR 96 | Ht 64.0 in | Wt 155.0 lb

## 2020-08-11 DIAGNOSIS — I2510 Atherosclerotic heart disease of native coronary artery without angina pectoris: Secondary | ICD-10-CM | POA: Diagnosis not present

## 2020-08-11 DIAGNOSIS — I10 Essential (primary) hypertension: Secondary | ICD-10-CM

## 2020-08-11 DIAGNOSIS — R072 Precordial pain: Secondary | ICD-10-CM

## 2020-08-11 DIAGNOSIS — F17200 Nicotine dependence, unspecified, uncomplicated: Secondary | ICD-10-CM | POA: Diagnosis not present

## 2020-08-11 NOTE — Progress Notes (Signed)
Cardiology Office Note:    Date:  08/11/2020   ID:  Judy Erickson, DOB Dec 30, 1950, MRN [id number]  PCP:  Verdie Shire, MD   [place]  Cardiologist:  Kate Sable, MD  Advanced Practice Provider:  No care team member to display Electrophysiologist:  None       Referring MD: Flora Lipps, MD   Chief Complaint  Patient presents with  . New Patient (Initial Visit)    Referred by Dr. Mortimer Fries for chest pain,. Patient c.o sob on exertion and chest pain. Meds reviewed verbally with patient.     History of Present Illness:    Judy Erickson is a 70 y.o. female with a hx of hypertension, current smoker x50+ years, COPD on oxygen, GERD who presents due to chest pain.  Patient states having symptoms of chest pain for over a year now.  Symptoms typically occur when she tries to clean or exerts herself.  Also endorses shortness of breath with exertion.  High-resolution chest CT obtained 08/2019 showed coronary three-vessel calcification.  Denies any immediate family history of heart disease.  Has a history of reflux, upset stomach which is why she does not take aspirin.  Has an appointment next week with primary care provider, planning on obtaining lipid panels then.  Past Medical History:  Diagnosis Date  . Abdominal hernia   . Allergic rhinitis due to allergen   . Arthritis   . Asthma   . Bell's palsy   . Bipolar disorder ([place])    bipolar affect, depressed  . COPD (chronic obstructive pulmonary disease) ([place])   . Depression   . Eczema   . Genital warts   . Genital warts   . GERD (gastroesophageal reflux disease)   . Headache    migraines  . History of migraine headaches   . Hypertension    benign  . Osteoporosis   . Pulmonary nodules    subcentimeter  . Seizures ([place])   . Sleep apnea   . Thyroid disease   . Tobacco use disorder   . Tremor   . Vitamin B12 deficiency   . Vitamin D deficiency     Past Surgical History:  Procedure Laterality Date   . ABDOMINAL HYSTERECTOMY    . CHOLECYSTECTOMY    . COLONOSCOPY WITH PROPOFOL N/A 08/28/2015   Procedure: COLONOSCOPY WITH PROPOFOL;  Surgeon: Josefine Class, MD;  Location: [facility] ENDOSCOPY;  Service: Endoscopy;  Laterality: N/A;  . ESOPHAGOGASTRODUODENOSCOPY N/A 08/02/2016   Procedure: ESOPHAGOGASTRODUODENOSCOPY (EGD);  Surgeon: Manya Silvas, MD;  Location: [facility] ENDOSCOPY;  Service: Endoscopy;  Laterality: N/A;  . ESOPHAGOGASTRODUODENOSCOPY (EGD) WITH PROPOFOL N/A 08/28/2015   Procedure: ESOPHAGOGASTRODUODENOSCOPY (EGD) WITH PROPOFOL;  Surgeon: Josefine Class, MD;  Location: [facility] ENDOSCOPY;  Service: Endoscopy;  Laterality: N/A;  . HERNIA REPAIR      Current Medications: Current Meds  Medication Sig  . acetaminophen (TYLENOL) 500 MG tablet Take 1,000 mg by mouth.  Marland Kitchen albuterol (PROVENTIL) (2.5 MG/3ML) 0.083% nebulizer solution Take 2.5 mg by nebulization every 6 (six) hours as needed for wheezing or shortness of breath.  Marland Kitchen albuterol (VENTOLIN HFA) 108 (90 Base) MCG/ACT inhaler Inhale into the lungs every 6 (six) hours as needed for wheezing or shortness of breath.  Marland Kitchen buPROPion (WELLBUTRIN XL) 300 MG 24 hr tablet Take 300 mg by mouth daily.  . busPIRone (BUSPAR) 5 MG tablet Take 5 mg by mouth 2 (two) times daily.  . cetirizine (ZYRTEC ALLERGY) 10 MG tablet Take  1 tablet (10 mg total) by mouth daily.  . cimetidine (TAGAMET) 400 MG tablet Take 400 mg by mouth at bedtime.  . clobetasol ointment (TEMOVATE) 8.34 % Apply 1 application topically 2 (two) times daily.  Marland Kitchen dexlansoprazole (DEXILANT) 60 MG capsule Take 60 mg by mouth daily.  Marland Kitchen dicyclomine (BENTYL) 20 MG tablet Take 20 mg by mouth every 6 (six) hours.  . fluticasone (FLONASE) 50 MCG/ACT nasal spray Place 1 spray into both nostrils daily.  . fluticasone furoate-vilanterol (BREO ELLIPTA) 100-25 MCG/INH AEPB INHALE 1 INHALATION INTO THE LUNGS ONCE DAILY  . gabapentin (NEURONTIN) 300 MG capsule Take 300 mg by mouth 3 (three) times  daily.  . hydrochlorothiazide (HYDRODIURIL) 12.5 MG tablet Take 12.5 mg by mouth daily.  Marland Kitchen HYDROcodone-homatropine (HYCODAN) 5-1.5 MG/5ML syrup Take by mouth.  Marland Kitchen ipratropium (ATROVENT) 0.06 % nasal spray Place 2 sprays into both nostrils 4 (four) times daily.  Marland Kitchen levalbuterol (XOPENEX) 0.63 MG/3ML nebulizer solution Inhale into the lungs.  . Linaclotide (LINZESS) 145 MCG CAPS capsule Take 145 mcg by mouth daily.  Marland Kitchen LORazepam (ATIVAN) 1 MG tablet Take 1 mg by mouth every 8 (eight) hours. Take 1/2 tablet by mouth three times a day as needed  . losartan (COZAAR) 100 MG tablet Take 100 mg by mouth daily.  . meclizine (ANTIVERT) 12.5 MG tablet Take 12.5 mg by mouth 3 (three) times daily as needed.  . methimazole (TAPAZOLE) 5 MG tablet Take 2.5 mg by mouth daily.  . ondansetron (ZOFRAN) 4 MG tablet Take 4 mg by mouth every 8 (eight) hours as needed for nausea or vomiting.  . predniSONE (DELTASONE) 20 MG tablet Take 1 tablet (20 mg total) by mouth daily with breakfast. 10 days  . Roflumilast (DALIRESP) 250 MCG TABS Take by mouth.  . sucralfate (CARAFATE) 1 g tablet Take 1 g by mouth 4 (four) times daily -  with meals and at bedtime.  Marland Kitchen tiotropium (SPIRIVA) 18 MCG inhalation capsule Place 18 mcg into inhaler and inhale daily.  . vitamin B-12 (CYANOCOBALAMIN) 500 MCG tablet Take 500 mcg by mouth daily.  . Vitamin D, Cholecalciferol, 1000 units CAPS Take by mouth.     Allergies:   Aspirin, Atorvastatin, Nsaids, Promethazine hcl, and Sulfa antibiotics   Social History   Socioeconomic History  . Marital status: Married    Spouse name: Not on file  . Number of children: Not on file  . Years of education: Not on file  . Highest education level: Not on file  Occupational History  . Not on file  Tobacco Use  . Smoking status: Current Some Day Smoker    Packs/day: 2.00    Years: 55.00    Pack years: 110.00    Types: Cigarettes  . Smokeless tobacco: Never Used  . Tobacco comment: 1 pack per  week-08/01/2020  Substance and Sexual Activity  . Alcohol use: No  . Drug use: No  . Sexual activity: Not on file  Other Topics Concern  . Not on file  Social History Narrative  . Not on file   Social Determinants of Health   Financial Resource Strain: Not on file  Food Insecurity: Not on file  Transportation Needs: Not on file  Physical Activity: Not on file  Stress: Not on file  Social Connections: Not on file     Family History: The patient's family history is not on file.  ROS:   Please see the history of present illness.     All other systems reviewed  and are negative.  EKGs/Labs/Other Studies Reviewed:    The following studies were reviewed today:   EKG:  EKG is  ordered today.  The ekg ordered today demonstrates normal sinus rhythm, normal ECG  Recent Labs: No results found for requested labs within last 8760 hours.  Recent Lipid Panel No results found for: CHOL, TRIG, HDL, CHOLHDL, VLDL, LDLCALC, LDLDIRECT   Risk Assessment/Calculations:      Physical Exam:    VS:  BP 126/60 (BP Location: Right Arm, Patient Position: Sitting, Cuff Size: Normal)   Pulse 96   Ht 5\' 4"  (1.626 m)   Wt 155 lb (70.3 kg)   SpO2 90%   BMI 26.61 kg/m     Wt Readings from Last 3 Encounters:  08/11/20 155 lb (70.3 kg)  08/01/20 153 lb 12.8 oz (69.8 kg)  05/15/20 160 lb (72.6 kg)     GEN:  Well nourished, well developed in no acute distress HEENT: Normal NECK: No JVD; No carotid bruits LYMPHATICS: No lymphadenopathy CARDIAC: RRR, no murmurs, rubs, gallops RESPIRATORY: Rhonchi at bases, expiratory wheezing ABDOMEN: Soft, non-tender, non-distended MUSCULOSKELETAL:  No edema; No deformity  SKIN: Warm and dry NEUROLOGIC:  Alert and oriented x 3 PSYCHIATRIC:  Normal affect   ASSESSMENT:    1. Precordial pain   2. Coronary artery disease involving native coronary artery of native heart, unspecified whether angina present   3. Primary hypertension   4. Smoking     PLAN:    In order of problems listed above:  1. Patient with chest pain consistent with angina pectoris.  Risk factors of hypertension, current smoker.  Get echocardiogram, get Lexiscan Myoview to evaluate presence of ischemia. 2. Three-vessel coronary artery calcification noted on chest CT.  Echo and Myoview as scheduled.  Obtain lipid panel with PCP as scheduled next week. 3. Hypertension, BP controlled.  Continue current BP meds. 4. Current smoker, cessation advised.  Follow up after echo and Myoview.     Medication Adjustments/Labs and Tests Ordered: Current medicines are reviewed at length with the patient today.  Concerns regarding medicines are outlined above.  Orders Placed This Encounter  Procedures  . NM Myocar Multi W/Spect W/Wall Motion / EF  . EKG 12-Lead  . ECHOCARDIOGRAM COMPLETE   No orders of the defined types were placed in this encounter.   Patient Instructions  Medication Instructions:  Your physician recommends that you continue on your current medications as directed. Please refer to the Current Medication list given to you today.  *If you need a refill on your cardiac medications before your next appointment, please call your pharmacy*   Lab Work: None ordered If you have labs (blood work) drawn today and your tests are completely normal, you will receive your results only by: Marland Kitchen MyChart Message (if you have MyChart) OR . A paper copy in the mail If you have any lab test that is abnormal or we need to change your treatment, we will call you to review the results.   Testing/Procedures:  1.  Your physician has requested that you have an echocardiogram. Echocardiography is a painless test that uses sound waves to create images of your heart. It provides your doctor with information about the size and shape of your heart and how well your heart's chambers and valves are working. This procedure takes approximately one hour. There are no restrictions for  this procedure.   2.  [place]   Your caregiver has ordered a Stress Test with nuclear imaging.  The purpose of this test is to evaluate the blood supply to your heart muscle. This procedure is referred to as a "Non-Invasive Stress Test." This is because other than having an IV started in your vein, nothing is inserted or "invades" your body. Cardiac stress tests are done to find areas of poor blood flow to the heart by determining the extent of coronary artery disease (CAD). Some patients exercise on a treadmill, which naturally increases the blood flow to your heart, while others who are  unable to walk on a treadmill due to physical limitations have a pharmacologic/chemical stress agent called Lexiscan . This medicine will mimic walking on a treadmill by temporarily increasing your coronary blood flow.      PLEASE REPORT TO [facility] MEDICAL MALL ENTRANCE   THE VOLUNTEERS AT THE FIRST DESK WILL DIRECT YOU WHERE TO GO     *Please note: these test may take anywhere between 2-4 hours to complete       Date of Procedure:_____________________________________   Arrival Time for Procedure:______________________________    PLEASE NOTIFY THE OFFICE AT LEAST 24 HOURS IN ADVANCE IF YOU ARE UNABLE TO KEEP YOUR APPOINTMENT.  [place] 24 HOURS IN ADVANCE IF YOU ARE UNABLE TO KEEP YOUR APPOINTMENT. [phone]         How to prepare for your Myoview test:         _XX___:  Hold other medications as follows: hydrochlorothiazide     1. Do not eat or drink after midnight  2. No caffeine for 24 hours prior to test  3. No smoking 24 hours prior to test.  4. Unless instructed otherwise, Take your medication with a small sips of water.    5.         Ladies, please do not wear dresses. Skirts or pants are appropriate. Please wear a short sleeve shirt.  6. No perfume, cologne or lotion.  7. Wear comfortable walking shoes. No heels!      Follow-Up: At [facility], you and your health needs are our priority.  As part of our continuing mission to provide you with exceptional heart care, we have created designated Provider Care Teams.  These Care Teams include your primary Cardiologist (physician) and Advanced Practice Providers (APPs -  Physician Assistants and Nurse Practitioners) who all work together to provide you with the care you need, when you need it.  We recommend signing up for the patient portal called "MyChart".  Sign up information is provided on this After Visit Summary.  MyChart is used to connect with patients for Virtual Visits (Telemedicine).  Patients are able to view lab/test results, encounter notes, upcoming appointments, etc.  Non-urgent messages can be sent to your provider as well.   To learn more about what you can do with MyChart, go to [email or web address].    Your next appointment:   Follow up after testing   The format for your next appointment:   In Person  Provider:   Kate Sable, MD   Other Instructions      Signed, Kate Sable, MD  08/11/2020 1:01 PM    [place]

## 2020-08-11 NOTE — Patient Instructions (Signed)
Medication Instructions:  Your physician recommends that you continue on your current medications as directed. Please refer to the Current Medication list given to you today.  *If you need a refill on your cardiac medications before your next appointment, please call your pharmacy*   Lab Work: None ordered If you have labs (blood work) drawn today and your tests are completely normal, you will receive your results only by: Marland Kitchen MyChart Message (if you have MyChart) OR . A paper copy in the mail If you have any lab test that is abnormal or we need to change your treatment, we will call you to review the results.   Testing/Procedures:  1.  Your physician has requested that you have an echocardiogram. Echocardiography is a painless test that uses sound waves to create images of your heart. It provides your doctor with information about the size and shape of your heart and how well your heart's chambers and valves are working. This procedure takes approximately one hour. There are no restrictions for this procedure.   2.  [place]   Your caregiver has ordered a Stress Test with nuclear imaging. The purpose of this test is to evaluate the blood supply to your heart muscle. This procedure is referred to as a "Non-Invasive Stress Test." This is because other than having an IV started in your vein, nothing is inserted or "invades" your body. Cardiac stress tests are done to find areas of poor blood flow to the heart by determining the extent of coronary artery disease (CAD). Some patients exercise on a treadmill, which naturally increases the blood flow to your heart, while others who are  unable to walk on a treadmill due to physical limitations have a pharmacologic/chemical stress agent called Lexiscan . This medicine will mimic walking on a treadmill by temporarily increasing your coronary blood flow.      PLEASE REPORT TO [facility] MEDICAL MALL ENTRANCE   THE VOLUNTEERS AT THE FIRST DESK WILL DIRECT  YOU WHERE TO GO     *Please note: these test may take anywhere between 2-4 hours to complete       Date of Procedure:_____________________________________   Arrival Time for Procedure:______________________________    PLEASE NOTIFY THE OFFICE AT LEAST 24 HOURS IN ADVANCE IF YOU ARE UNABLE TO KEEP YOUR APPOINTMENT.  [place] 24 HOURS IN ADVANCE IF YOU ARE UNABLE TO KEEP YOUR APPOINTMENT. [phone]         How to prepare for your Myoview test:         _XX___:  Hold other medications as follows: hydrochlorothiazide     1. Do not eat or drink after midnight  2. No caffeine for 24 hours prior to test  3. No smoking 24 hours prior to test.  4. Unless instructed otherwise, Take your medication with a small sips of water.    5.         Ladies, please do not wear dresses. Skirts or pants are appropriate. Please wear a short sleeve shirt.  6. No perfume, cologne or lotion.  7. Wear comfortable walking shoes. No heels!     Follow-Up: At [facility], you and your health needs are our priority.  As part of our continuing mission to provide you with exceptional heart care, we have created designated Provider Care Teams.  These Care Teams include your primary Cardiologist (physician) and Advanced Practice Providers (APPs -  Physician Assistants and Nurse Practitioners) who all work  together to provide you with the care you need, when you need it.  We recommend signing up for the patient portal called "MyChart".  Sign up information is provided on this After Visit Summary.  MyChart is used to connect with patients for Virtual Visits (Telemedicine).  Patients are able to view lab/test results, encounter notes, upcoming appointments, etc.  Non-urgent messages can be sent to your provider as well.   To learn more about what you can do with MyChart, go to [email or web address].    Your next appointment:   Follow up after testing    The format for your next appointment:   In Person  Provider:   Kate Sable, MD   Other Instructions

## 2020-08-15 ENCOUNTER — Ambulatory Visit: Admit: 2020-08-15 | Discharge: 2020-08-16 | Payer: MEDICARE

## 2020-08-15 DIAGNOSIS — I10 Essential (primary) hypertension: Principal | ICD-10-CM

## 2020-08-15 DIAGNOSIS — J449 Chronic obstructive pulmonary disease, unspecified: Principal | ICD-10-CM

## 2020-08-15 DIAGNOSIS — Z79899 Other long term (current) drug therapy: Principal | ICD-10-CM

## 2020-08-15 DIAGNOSIS — E782 Mixed hyperlipidemia: Principal | ICD-10-CM

## 2020-08-15 MED ORDER — ROSUVASTATIN 10 MG TABLET
ORAL_TABLET | Freq: Every evening | ORAL | 3 refills | 90 days | Status: CP
Start: 2020-08-15 — End: 2021-08-15

## 2020-08-16 ENCOUNTER — Telehealth: Payer: Self-pay | Admitting: *Deleted

## 2020-08-16 NOTE — Telephone Encounter (Signed)
Attempted to contact to schedule lung screening scan. However, there is no answer or voicemail option available.

## 2020-08-17 DIAGNOSIS — N644 Mastodynia: Principal | ICD-10-CM

## 2020-08-17 DIAGNOSIS — Z1231 Encounter for screening mammogram for malignant neoplasm of breast: Principal | ICD-10-CM

## 2020-08-22 ENCOUNTER — Other Ambulatory Visit: Payer: Self-pay

## 2020-08-22 ENCOUNTER — Encounter
Admission: RE | Admit: 2020-08-22 | Discharge: 2020-08-22 | Disposition: A | Discharge status: Home / Self Care | Payer: Medicare Other | Source: Ambulatory Visit | Attending: Cardiology | Admitting: Cardiology

## 2020-08-22 DIAGNOSIS — R072 Precordial pain: Secondary | ICD-10-CM | POA: Diagnosis not present

## 2020-08-22 MED ORDER — TECHNETIUM TC 99M TETROFOSMIN IV KIT
10.0000 | PACK | Freq: Once | INTRAVENOUS | Status: AC | PRN
  Administered 2020-08-22: 10.818 via INTRAVENOUS

## 2020-08-22 MED ORDER — REGADENOSON 0.4 MG/5ML IV SOLN
0.4000 mg | Freq: Once | INTRAVENOUS | Status: AC
  Administered 2020-08-22: 0.4 mg via INTRAVENOUS
  Filled 2020-08-22: qty 5

## 2020-08-22 MED ORDER — TECHNETIUM TC 99M TETROFOSMIN IV KIT
30.0000 | PACK | Freq: Once | INTRAVENOUS | Status: AC | PRN
  Administered 2020-08-22: 32.19 via INTRAVENOUS

## 2020-08-23 LAB — NM MYOCAR MULTI W/SPECT W/WALL MOTION / EF
Estimated workload: 1 METS
Exercise duration (min): 0 min
Exercise duration (sec): 0 s
LV dias vol: 30 mL (ref 46–106)
LV sys vol: 5 mL
MPHR: 150 {beats}/min
Peak HR: 116 {beats}/min
Percent HR: 77 %
Rest HR: 78 {beats}/min
TID: 0.7

## 2020-08-28 ENCOUNTER — Encounter: Admit: 2020-08-28 | Discharge: 2020-08-28 | Disposition: A | Discharge status: Home / Self Care | Payer: MEDICARE

## 2020-08-28 ENCOUNTER — Other Ambulatory Visit: Payer: Self-pay | Admitting: Internal Medicine

## 2020-08-28 ENCOUNTER — Telehealth: Payer: Self-pay | Admitting: Internal Medicine

## 2020-08-28 DIAGNOSIS — J441 Chronic obstructive pulmonary disease with (acute) exacerbation: Principal | ICD-10-CM

## 2020-08-28 MED ORDER — AZITHROMYCIN 250 MG TABLET
ORAL_TABLET | ORAL | 0 refills | 5 days | Status: CP
Start: 2020-08-28 — End: 2020-09-03

## 2020-08-28 MED ORDER — PREDNISONE 20 MG TABLET
ORAL_TABLET | Freq: Every day | ORAL | 0 refills | 3.00000 days | Status: CP
Start: 2020-08-28 — End: 2020-08-31

## 2020-08-28 NOTE — Telephone Encounter (Signed)
Agree patient needs to be seen in the ED.

## 2020-08-28 NOTE — Telephone Encounter (Signed)
Called and spoke to patient.  Patient reports of increased sob with exertion, bilateral chest/back discomfort, productive cough with yellow sputum and wheezing. Sx have been present for 3 days. Patient unable to complete sentence due to sob and work of breathing.  She is on 2L cont. spo2 is maintaining between 92-98%.  vaccinated against Covid and flu.  Albuterol neb Q4H, spiriva once daily and Breo once daily with no improvement in sx.   I have recommended ED due to work of breathing and chest discomfort.  Routing to Dr. Patsey Berthold as an Juluis Rainier. Dr. Mortimer Fries is unavailable.

## 2020-09-01 ENCOUNTER — Other Ambulatory Visit: Payer: Self-pay

## 2020-09-01 ENCOUNTER — Ambulatory Visit (INDEPENDENT_AMBULATORY_CARE_PROVIDER_SITE_OTHER): Payer: Medicare Other

## 2020-09-01 DIAGNOSIS — R072 Precordial pain: Secondary | ICD-10-CM

## 2020-09-01 DIAGNOSIS — I2510 Atherosclerotic heart disease of native coronary artery without angina pectoris: Secondary | ICD-10-CM | POA: Diagnosis not present

## 2020-09-01 LAB — ECHOCARDIOGRAM COMPLETE
AR max vel: 3.01 cm2
AV Area VTI: 2.76 cm2
AV Area mean vel: 2.75 cm2
AV Mean grad: 8 mmHg
AV Peak grad: 14.6 mmHg
Ao pk vel: 1.91 m/s
Area-P 1/2: 2.41 cm2

## 2020-09-04 ENCOUNTER — Telehealth: Payer: Self-pay | Admitting: *Deleted

## 2020-09-04 DIAGNOSIS — Z87891 Personal history of nicotine dependence: Secondary | ICD-10-CM

## 2020-09-04 DIAGNOSIS — Z122 Encounter for screening for malignant neoplasm of respiratory organs: Secondary | ICD-10-CM

## 2020-09-04 DIAGNOSIS — F17200 Nicotine dependence, unspecified, uncomplicated: Secondary | ICD-10-CM

## 2020-09-04 NOTE — Telephone Encounter (Signed)
Received referral for initial lung cancer screening scan. Contacted patient and obtained smoking history,(current, 110 pack year) as well as answering questions related to screening process. Patient denies signs of lung cancer such as weight loss or hemoptysis. Patient denies comorbidity that would prevent curative treatment if lung cancer were found. Patient is scheduled for shared decision making visit and CT scan on 09/13/20 at 1030am.

## 2020-09-05 ENCOUNTER — Encounter: Payer: Self-pay | Admitting: Student in an Organized Health Care Education/Training Program

## 2020-09-05 ENCOUNTER — Telehealth: Payer: Self-pay | Admitting: Internal Medicine

## 2020-09-05 ENCOUNTER — Other Ambulatory Visit: Payer: Self-pay

## 2020-09-05 ENCOUNTER — Ambulatory Visit
Payer: Medicare Other | Attending: Student in an Organized Health Care Education/Training Program | Admitting: Student in an Organized Health Care Education/Training Program

## 2020-09-05 ENCOUNTER — Other Ambulatory Visit: Payer: Self-pay | Admitting: Internal Medicine

## 2020-09-05 VITALS — BP 153/72 | HR 107 | Temp 97.2°F | Resp 16 | Ht 64.0 in | Wt 155.0 lb

## 2020-09-05 DIAGNOSIS — G608 Other hereditary and idiopathic neuropathies: Secondary | ICD-10-CM | POA: Diagnosis present

## 2020-09-05 DIAGNOSIS — G8929 Other chronic pain: Secondary | ICD-10-CM | POA: Diagnosis present

## 2020-09-05 DIAGNOSIS — M5416 Radiculopathy, lumbar region: Secondary | ICD-10-CM | POA: Diagnosis present

## 2020-09-05 DIAGNOSIS — G894 Chronic pain syndrome: Secondary | ICD-10-CM | POA: Diagnosis present

## 2020-09-05 DIAGNOSIS — M47816 Spondylosis without myelopathy or radiculopathy, lumbar region: Secondary | ICD-10-CM

## 2020-09-05 DIAGNOSIS — G5793 Unspecified mononeuropathy of bilateral lower limbs: Secondary | ICD-10-CM

## 2020-09-05 DIAGNOSIS — M48062 Spinal stenosis, lumbar region with neurogenic claudication: Secondary | ICD-10-CM | POA: Diagnosis present

## 2020-09-05 MED ORDER — DALIRESP 250 MCG PO TABS: 1.0000 | ORAL_TABLET | Freq: Every day | ORAL | 11 refills | Status: AC

## 2020-09-05 MED ORDER — TRAMADOL HCL 50 MG PO TABS: 50.0000 mg | ORAL_TABLET | Freq: Two times a day (BID) | ORAL | 2 refills | Status: AC | PRN

## 2020-09-05 NOTE — Progress Notes (Signed)
PROVIDER NOTE: Information contained herein reflects review and annotations entered in association with encounter. Interpretation of such information and data should be left to medically-trained personnel. Information provided to patient can be located elsewhere in the medical record under "Patient Instructions". Document created using STT-dictation technology, any transcriptional errors that may result from process are unintentional.    Patient: Judy Erickson  Service Category: E/M  Provider: Gillis Santa, MD  DOB: 05/03/1951  DOS: 09/05/2020  Specialty: Interventional Pain Management  MRN: [id number]  Setting: Ambulatory outpatient  PCP: Verdie Shire, MD  Type: Established Patient    Referring Provider: Verdie Shire, MD  Location: Office  Delivery: Face-to-face     HPI  Judy Erickson, a 70 y.o. year old female, is here today because of her Lumbar facet arthropathy [M47.816]. Ms. Hopes's primary complain today is Back Pain (lower) Last encounter: My last encounter with her was on 05/23/2020. Pertinent problems: [name] does not have any pertinent problems on file. Pain Assessment: Severity of Chronic pain is reported as a 7 /10. Location: Back Lower/both legs to the feet. Onset: More than a month ago. Quality: Aching. Timing: Constant. Modifying factor(s): medications. Vitals:  height is '5\' 4"'  (1.626 m) and weight is 155 lb (70.3 kg). Her temporal temperature is 97.2 F (36.2 C) (abnormal). Her blood pressure is 153/72 (abnormal) and her pulse is 107 (abnormal). Her respiration is 16 and oxygen saturation is 99%.   Reason for encounter: medication management.    Patient states that she has been in and out of the hospital for COPD exacerbation and shortness of breath.  She states that she has seen many specialists in the number of appointments that she has in a given week are tiring her out and increasing her overall pain.  She continues her tramadol as prescribed.  She does not take  tramadol every day, only when she has severe pain flare.  Patient did have a DEXA scan which showed decreased bone density.  She also showed me a picture of her mother before she passed to had very severe osteoarthritis.  I have instructed the patient to be mindful of steroid exposure and any other factors that could impact her bone strength.  I will refill her tramadol as below.  Follow-up in 3 months for medication refill.  Pharmacotherapy Assessment   Analgesic: Tramadol 50 mg twice daily as needed   Monitoring: [place] PMP: PDMP reviewed during this encounter.       Pharmacotherapy: No side-effects or adverse reactions reported. Compliance: No problems identified. Effectiveness: Clinically acceptable.  Landis Martins, RN  09/05/2020  1:33 PM  Sign when Signing Visit Nursing Pain Medication Assessment:  Safety precautions to be maintained throughout the outpatient stay will include: orient to surroundings, keep bed in low position, maintain call bell within reach at all times, provide assistance with transfer out of bed and ambulation.  Medication Inspection Compliance: [name] did not comply with our request to bring her pills to be counted. She was reminded that bringing the medication bottles, even when empty, is a requirement.  Medication: None brought in. Pill/Patch Count: None available to be counted. Bottle Appearance: No container available. Did not bring bottle(s) to appointment. Filled Date: N/A Last Medication intake:  Today    UDS: No results found for: SUMMARY   ROS  Constitutional: Denies any fever or chills Gastrointestinal: No reported hemesis, hematochezia, vomiting, or acute GI distress Musculoskeletal: +lbp, b/l leg pain Neurological: No reported episodes of acute  onset apraxia, aphasia, dysarthria, agnosia, amnesia, paralysis, loss of coordination, or loss of consciousness  Medication Review  HYDROcodone-homatropine, LORazepam, Roflumilast, Vitamin D  (Cholecalciferol), acetaminophen, albuterol, buPROPion, busPIRone, cetirizine, cimetidine, clobetasol ointment, dexlansoprazole, dicyclomine, fluticasone, fluticasone furoate-vilanterol, gabapentin, hydrochlorothiazide, ipratropium, levalbuterol, linaclotide, losartan, meclizine, methimazole, ondansetron, predniSONE, rosuvastatin, sucralfate, tiotropium, traMADol, and vitamin B-12  History Review  Allergy: [name] is allergic to aspirin, atorvastatin, nsaids, promethazine hcl, and sulfa antibiotics. Drug: [name]  reports no history of drug use. Alcohol:  reports no history of alcohol use. Tobacco:  reports that she has been smoking cigarettes. She has a 110.00 pack-year smoking history. She has never used smokeless tobacco. Social: [name]  reports that she has been smoking cigarettes. She has a 110.00 pack-year smoking history. She has never used smokeless tobacco. She reports that she does not drink alcohol and does not use drugs. Medical:  has a past medical history of Abdominal hernia, Allergic rhinitis due to allergen, Arthritis, Asthma, Bell's palsy, Bipolar disorder ([place]), COPD (chronic obstructive pulmonary disease) ([place]), Depression, Eczema, Genital warts, Genital warts, GERD (gastroesophageal reflux disease), Headache, History of migraine headaches, Hypertension, Osteoporosis, Pulmonary nodules, Seizures ([place]), Sleep apnea, Thyroid disease, Tobacco use disorder, Tremor, Vitamin B12 deficiency, and Vitamin D deficiency. Surgical: [name]  has a past surgical history that includes Abdominal hysterectomy; Cholecystectomy; Hernia repair; Colonoscopy with propofol (N/A, 08/28/2015); Esophagogastroduodenoscopy (egd) with propofol (N/A, 08/28/2015); and Esophagogastroduodenoscopy (N/A, 08/02/2016). Family: family history is not on file.  Laboratory Chemistry Profile   Renal Lab Results  Component Value Date   CREATININE 0.91 07/28/2015   GFRAA >60 07/28/2015   GFRNONAA >60 07/28/2015      Hepatic No results found for: AST, ALT, ALBUMIN, ALKPHOS, HCVAB, AMYLASE, LIPASE, AMMONIA   Electrolytes No results found for: NA, K, CL, CALCIUM, MG, PHOS   Bone No results found for: VD25OH, [id number], SW1093AT5, TD3220UR4, 25OHVITD1, 25OHVITD2, 25OHVITD3, TESTOFREE, TESTOSTERONE   Inflammation (CRP: Acute Phase) (ESR: Chronic Phase) No results found for: CRP, ESRSEDRATE, LATICACIDVEN     Note: Above Lab results reviewed.  Recent Imaging Review  ECHOCARDIOGRAM COMPLETE    ECHOCARDIOGRAM REPORT       Patient Name:   APARNA VANDERWEELE Date of Exam: 09/01/2020 Medical Rec #:  [id number]     Height:       64.0 in Accession #:    [id number]    Weight:       155.0 lb Date of Birth:  Mar 13, 1951     BSA:          1.756 m Patient Age:    45 years      BP:           130/64 mmHg Patient Gender: F             HR:           105 bpm. Exam Location:  [facility]  Procedure: 2D Echo, Cardiac Doppler and Color Doppler  Indications:    R06.02 SOB; R07.9* Chest pain, unspecified   History:        Patient has no prior history of Echocardiogram examinations.                 CAD, COPD, Arrythmias:Tachycardia, Signs/Symptoms:Shortness of                 Breath and Chest Pain; Risk Factors:Current Smoker and                 Hypertension. GERD.   Sonographer:  Pilar Jarvis RDMS, RVT, RDCS Referring Phys: [id number] [facility]    Sonographer Comments: Overall the patient did not tolerate the exam well. Extreme dyspnea. Not able to do a breath hold. Tachycardic throughout the exam IMPRESSIONS   1. Left ventricular ejection fraction, by estimation, is 60 to 65%. The left ventricle has normal function. The left ventricle has no regional wall motion abnormalities. There is mild left ventricular hypertrophy. Left ventricular diastolic parameters  are consistent with Grade I diastolic dysfunction (impaired relaxation).  2. Right ventricular systolic function is normal. The right ventricular size  is normal. Tricuspid regurgitation signal is inadequate for assessing PA pressure.  3. Left atrial size was mildly dilated.  FINDINGS  Left Ventricle: Left ventricular ejection fraction, by estimation, is 60 to 65%. The left ventricle has normal function. The left ventricle has no regional wall motion abnormalities. The left ventricular internal cavity size was normal in size. There is  mild left ventricular hypertrophy. Left ventricular diastolic parameters are consistent with Grade I diastolic dysfunction (impaired relaxation).  Right Ventricle: The right ventricular size is normal. No increase in right ventricular wall thickness. Right ventricular systolic function is normal. Tricuspid regurgitation signal is inadequate for assessing PA pressure.  Left Atrium: Left atrial size was mildly dilated.  Right Atrium: Right atrial size was normal in size.  Pericardium: There is no evidence of pericardial effusion.  Mitral Valve: The mitral valve is normal in structure. There is mild calcification of the mitral valve leaflet(s). No evidence of mitral valve regurgitation. No evidence of mitral valve stenosis.  Tricuspid Valve: The tricuspid valve is normal in structure. Tricuspid valve regurgitation is mild . No evidence of tricuspid stenosis.  Aortic Valve: The aortic valve was not well visualized. Aortic valve regurgitation is not visualized. No aortic stenosis is present. Aortic valve mean gradient measures 8.0 mmHg. Aortic valve peak gradient measures 14.6 mmHg. Aortic valve area, by VTI  measures 2.76 cm.  Pulmonic Valve: The pulmonic valve was normal in structure. Pulmonic valve regurgitation is not visualized. No evidence of pulmonic stenosis.  Aorta: The aortic root is normal in size and structure.  Venous: The inferior vena cava is normal in size with greater than 50% respiratory variability, suggesting right atrial pressure of 3 mmHg.  IAS/Shunts: No atrial level shunt detected by  color flow Doppler.    LEFT VENTRICLE PLAX 2D LVOT diam:     1.90 cm  Diastology LV SV:         95       LV e' medial:    6.42 cm/s LV SV Index:   54       LV E/e' medial:  11.6 LVOT Area:     2.84 cm LV e' lateral:   5.98 cm/s                         LV E/e' lateral: 12.5    RIGHT VENTRICLE             IVC RV Basal diam:  3.10 cm     IVC diam: 1.80 cm RV S prime:     14.00 cm/s TAPSE (M-mode): 1.5 cm  LEFT ATRIUM             Index LA diam:        4.00 cm 2.28 cm/m LA Vol (A2C):   33.6 ml 19.14 ml/m LA Vol (A4C):   39.7 ml 22.61 ml/m LA Biplane Vol: 36.7 ml 20.91  ml/m  AORTIC VALVE                    PULMONIC VALVE AV Area (Vmax):    3.01 cm     PV Vmax:       1.24 m/s AV Area (Vmean):   2.75 cm     PV Peak grad:  6.1 mmHg AV Area (VTI):     2.76 cm AV Vmax:           191.00 cm/s AV Vmean:          137.000 cm/s AV VTI:            0.345 m AV Peak Grad:      14.6 mmHg AV Mean Grad:      8.0 mmHg LVOT Vmax:         203.00 cm/s LVOT Vmean:        133.000 cm/s LVOT VTI:          0.336 m LVOT/AV VTI ratio: 0.97   AORTA Ao Root diam: 3.20 cm Ao Asc diam:  3.10 cm  MITRAL VALVE MV Area (PHT): 2.41 cm     SHUNTS MV Decel Time: 315 msec     Systemic VTI:  0.34 m MV E velocity: 74.60 cm/s   Systemic Diam: 1.90 cm MV A velocity: 110.00 cm/s MV E/A ratio:  0.68  Ida Rogue MD Electronically signed by Ida Rogue MD Signature Date/Time: 09/01/2020/6:34:58 PM      Final   Note: Reviewed        Physical Exam  General appearance: cooperative, anxious and confused  Mental status: Alert, oriented x 3 (person, place, & time)       Respiratory: Oxygen-dependent COPD Eyes: PERLA Vitals: BP (!) 153/72   Pulse (!) 107   Temp (!) 97.2 F (36.2 C) (Temporal)   Resp 16   Ht '5\' 4"'  (1.626 m)   Wt 155 lb (70.3 kg)   SpO2 99% Comment: O2 at 2L BNC  BMI 26.61 kg/m  BMI: Estimated body mass index is 26.61 kg/m as calculated from the following:   Height as of this  encounter: '5\' 4"'  (1.626 m).   Weight as of this encounter: 155 lb (70.3 kg). Ideal: Ideal body weight: 54.7 kg (120 lb 9.5 oz) Adjusted ideal body weight: 60.9 kg (134 lb 5.7 oz)  Lumbar Exam  Skin & Axial Inspection: No masses, redness, or swelling Alignment: Symmetrical Functional ROM: Pain restricted ROM       Stability: No instability detected Muscle Tone/Strength: Functionally intact. No obvious neuro-muscular anomalies detected. Sensory (Neurological): Dermatomal pain pattern and musculoskeletal Palpation: No palpable anomalies       Provocative Tests: Hyperextension/rotation test: (+) bilaterally for facet joint pain. Lumbar quadrant test (Kemp's test): (+) bilateral for foraminal stenosis Lateral bending test: (+) ipsilateral radicular pain, bilaterally. Positive for bilateral foraminal stenosis.  Gait & Posture Assessment  Ambulation: Patient came in today in a wheel chair Gait: Significantly limited. Dependent on assistive device to ambulate Posture: Difficulty standing up straight, due to pain   Lower Extremity Exam    Side: Right lower extremity  Side: Left lower extremity  Stability: No instability observed          Stability: No instability observed          Skin & Extremity Inspection: Skin color, temperature, and hair growth are WNL. No peripheral edema or cyanosis. No masses, redness, swelling, asymmetry, or associated skin lesions. No contractures.  Skin &  Extremity Inspection: Skin color, temperature, and hair growth are WNL. No peripheral edema or cyanosis. No masses, redness, swelling, asymmetry, or associated skin lesions. No contractures.  Functional ROM: Pain restricted ROM for hip and knee joints          Functional ROM: Pain restricted ROM for all joints of the lower extremity          Muscle Tone/Strength: Functionally intact. No obvious neuro-muscular anomalies detected.  Muscle Tone/Strength: Functionally intact. No obvious neuro-muscular anomalies  detected.  Sensory (Neurological): Neuropathic pain pattern        Sensory (Neurological): Neuropathic pain pattern        DTR: Patellar: deferred today Achilles: deferred today Plantar: deferred today  DTR: Patellar: deferred today Achilles: deferred today Plantar: deferred today  Palpation: No palpable anomalies  Palpation: No palpable anomalies    Assessment   Status Diagnosis  Controlled Controlled Controlled 1. Lumbar facet arthropathy   2. Lumbar spondylosis   3. 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 Spinal stenosis, lumbar region, with neurogenic claudication   5. Lumbar radiculopathy   6. Neuropathic pain of both legs   7. 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 Chronic pain syndrome       Plan of Care   Ms. LACRECIA DELVAL has a current medication list which includes the following long-term medication(s): albuterol, albuterol, bupropion, cetirizine, cimetidine, dexlansoprazole, dicyclomine, fluticasone, gabapentin, hydrochlorothiazide, ipratropium, levalbuterol, linaclotide, losartan, methimazole, daliresp, sucralfate, tiotropium, and rosuvastatin.  Pharmacotherapy (Medications Ordered): Meds ordered this encounter  Medications  . traMADol (ULTRAM) 50 MG tablet    Sig: Take 1 tablet (50 mg total) by mouth every 12 (twelve) hours as needed. For chronic pain syndrome    Dispense:  60 tablet    Refill:  2   Orders:  Orders Placed This Encounter  Procedures  . ToxASSURE Select 13 (MW), Urine    Volume: 30 ml(s). Minimum 3 ml of urine is needed. Document temperature of fresh sample. Indications: Long term (current) use of opiate analgesic (E18.590)    Order Specific Question:   Release to patient    Answer:   Immediate   Follow-up plan:   Return for patient will call when she is due for refill.     RIGHT L4/5 ESI 05/15/2020 (6cc): Not helpful, as needed order placed for diagnostic lumbar facets given severe facet arthrosis noted on lumbar  MRI and clinical findings of severe low back pain with facet loading.     Recent Visits Date Type Provider Dept  06/19/20 Telemedicine Gillis Santa, MD [facility]  Showing recent visits within past 90 days and meeting all other requirements Today's Visits Date Type Provider Dept  09/05/20 Office Visit Gillis Santa, MD [facility]  Showing today's visits and meeting all other requirements Future Appointments No visits were found meeting these conditions. Showing future appointments within next 90 days and meeting all other requirements  I discussed the assessment and treatment plan with the patient. The patient was provided an opportunity to ask questions and all were answered. The patient agreed with the plan and demonstrated an understanding of the instructions.  Patient advised to call back or seek an in-person evaluation if the symptoms or condition worsens.  Duration of encounter: 30 minutes.  Note by: Gillis Santa, MD Date: 09/05/2020; Time: 2:01 PM

## 2020-09-05 NOTE — Telephone Encounter (Signed)
Yes please

## 2020-09-05 NOTE — Progress Notes (Signed)
Nursing Pain Medication Assessment:  Safety precautions to be maintained throughout the outpatient stay will include: orient to surroundings, keep bed in low position, maintain call bell within reach at all times, provide assistance with transfer out of bed and ambulation.  Medication Inspection Compliance: Ms. Hedman did not comply with our request to bring her pills to be counted. She was reminded that bringing the medication bottles, even when empty, is a requirement.  Medication: None brought in. Pill/Patch Count: None available to be counted. Bottle Appearance: No container available. Did not bring bottle(s) to appointment. Filled Date: N/A Last Medication intake:  Today

## 2020-09-05 NOTE — Telephone Encounter (Signed)
Patient is requesting refill on Daliresp. This medication was not prescribed by our office or mentioned in last OV note.   Dr. Mortimer Fries, please advise if okay to refill. Thanks

## 2020-09-05 NOTE — Telephone Encounter (Signed)
Rx for Daliresp 250mg  has been sent to preferred pharmacy.  Patient is aware and voiced her understanding.  Nothing further needed.

## 2020-09-08 ENCOUNTER — Ambulatory Visit: Admit: 2020-09-08 | Discharge: 2020-09-09 | Payer: MEDICARE

## 2020-09-11 ENCOUNTER — Encounter: Admit: 2020-09-11 | Discharge: 2020-09-12 | Payer: MEDICARE | Attending: Clinical | Primary: Clinical

## 2020-09-11 ENCOUNTER — Encounter: Payer: Self-pay | Admitting: Cardiology

## 2020-09-11 ENCOUNTER — Ambulatory Visit (INDEPENDENT_AMBULATORY_CARE_PROVIDER_SITE_OTHER): Payer: Medicare Other | Admitting: Cardiology

## 2020-09-11 ENCOUNTER — Other Ambulatory Visit: Payer: Self-pay

## 2020-09-11 VITALS — BP 124/60 | HR 85 | Ht 64.0 in | Wt 161.0 lb

## 2020-09-11 DIAGNOSIS — I10 Essential (primary) hypertension: Secondary | ICD-10-CM

## 2020-09-11 DIAGNOSIS — I2510 Atherosclerotic heart disease of native coronary artery without angina pectoris: Secondary | ICD-10-CM

## 2020-09-11 DIAGNOSIS — F17200 Nicotine dependence, unspecified, uncomplicated: Secondary | ICD-10-CM

## 2020-09-11 NOTE — Patient Instructions (Signed)

## 2020-09-11 NOTE — Progress Notes (Signed)
Cardiology Office Note:    Date:  09/11/2020   ID:  Judy Erickson, DOB [birth year], MRN [id number]  PCP:  Verdie Shire, MD   [place]  Cardiologist:  Kate Sable, MD  Advanced Practice Provider:  No care team member to display Electrophysiologist:  None       Referring MD: Verdie Shire, MD   Chief Complaint  Patient presents with  . Other    Follow up post ECHO and Myoview. Meds reviewed verbally with patient.     History of Present Illness:    Judy Erickson is a 70 y.o. female with a hx of hypertension, coronary artery calcifications, current smoker x50+ years, COPD on oxygen, GERD who presents for follow-up.  Previously seen due to chest pain for over a year.  Due to risk factors, echocardiogram and Lexiscan Myoview were ordered.  She still has chest pain, also her shortness of breath with exertion which is chronic.  Chest symptoms appear reproducible with palpation sometimes on the right side.  Recent mammogram was unrevealing.  Followed up with primary care provider regarding cholesterol, recently started on statin.  Repeat lipid panel is being planned with primary care provider in a couple of months.  Prior notes.   High-resolution chest CT obtained 08/2019 showed coronary three-vessel calcification.  Has a history of reflux, upset stomach which is why she does not take aspirin.     Past Medical History:  Diagnosis Date  . Abdominal hernia   . Allergic rhinitis due to allergen   . Arthritis   . Asthma   . Bell's palsy   . Bipolar disorder ([place])    bipolar affect, depressed  . COPD (chronic obstructive pulmonary disease) ([place])   . Depression   . Eczema   . Genital warts   . Genital warts   . GERD (gastroesophageal reflux disease)   . Headache    migraines  . History of migraine headaches   . Hypertension    benign  . Osteoporosis   . Pulmonary nodules    subcentimeter  . Seizures ([place])   . Sleep apnea   . Thyroid disease    . Tobacco use disorder   . Tremor   . Vitamin B12 deficiency   . Vitamin D deficiency     Past Surgical History:  Procedure Laterality Date  . ABDOMINAL HYSTERECTOMY    . CHOLECYSTECTOMY    . COLONOSCOPY WITH PROPOFOL N/A 08/28/2015   Procedure: COLONOSCOPY WITH PROPOFOL;  Surgeon: Josefine Class, MD;  Location: [facility] ENDOSCOPY;  Service: Endoscopy;  Laterality: N/A;  . ESOPHAGOGASTRODUODENOSCOPY N/A 08/02/2016   Procedure: ESOPHAGOGASTRODUODENOSCOPY (EGD);  Surgeon: Manya Silvas, MD;  Location: [facility] ENDOSCOPY;  Service: Endoscopy;  Laterality: N/A;  . ESOPHAGOGASTRODUODENOSCOPY (EGD) WITH PROPOFOL N/A 08/28/2015   Procedure: ESOPHAGOGASTRODUODENOSCOPY (EGD) WITH PROPOFOL;  Surgeon: Josefine Class, MD;  Location: [facility] ENDOSCOPY;  Service: Endoscopy;  Laterality: N/A;  . HERNIA REPAIR      Current Medications: Current Meds  Medication Sig  . acetaminophen (TYLENOL) 500 MG tablet Take 1,000 mg by mouth.  Marland Kitchen albuterol (PROVENTIL) (2.5 MG/3ML) 0.083% nebulizer solution Take 2.5 mg by nebulization every 6 (six) hours as needed for wheezing or shortness of breath.  Marland Kitchen albuterol (VENTOLIN HFA) 108 (90 Base) MCG/ACT inhaler Inhale into the lungs every 6 (six) hours as needed for wheezing or shortness of breath.  Marland Kitchen buPROPion (WELLBUTRIN XL) 300 MG 24 hr tablet Take 300 mg by mouth daily.  . busPIRone (  BUSPAR) 5 MG tablet Take 5 mg by mouth 2 (two) times daily.  . cetirizine (ZYRTEC ALLERGY) 10 MG tablet Take 1 tablet (10 mg total) by mouth daily.  . cimetidine (TAGAMET) 400 MG tablet Take 400 mg by mouth at bedtime.  . clobetasol ointment (TEMOVATE) 2.42 % Apply 1 application topically 2 (two) times daily.  Marland Kitchen dexlansoprazole (DEXILANT) 60 MG capsule Take 60 mg by mouth daily.  Marland Kitchen dicyclomine (BENTYL) 20 MG tablet Take 20 mg by mouth every 6 (six) hours.  . fluticasone (FLONASE) 50 MCG/ACT nasal spray Place 1 spray into both nostrils daily.  . fluticasone furoate-vilanterol (BREO  ELLIPTA) 100-25 MCG/INH AEPB INHALE 1 INHALATION INTO THE LUNGS ONCE DAILY  . gabapentin (NEURONTIN) 300 MG capsule Take 300 mg by mouth 3 (three) times daily.  . hydrochlorothiazide (HYDRODIURIL) 12.5 MG tablet Take 12.5 mg by mouth daily.  Marland Kitchen HYDROcodone-homatropine (HYCODAN) 5-1.5 MG/5ML syrup Take by mouth.  Marland Kitchen ipratropium (ATROVENT) 0.06 % nasal spray Place 2 sprays into both nostrils 4 (four) times daily.  Marland Kitchen levalbuterol (XOPENEX) 0.63 MG/3ML nebulizer solution Inhale into the lungs.  . Linaclotide (LINZESS) 145 MCG CAPS capsule Take 145 mcg by mouth daily.  Marland Kitchen LORazepam (ATIVAN) 1 MG tablet Take 1 mg by mouth every 8 (eight) hours. Take 1/2 tablet by mouth three times a day as needed  . losartan (COZAAR) 100 MG tablet Take 100 mg by mouth daily.  . meclizine (ANTIVERT) 12.5 MG tablet Take 12.5 mg by mouth 3 (three) times daily as needed.  . methimazole (TAPAZOLE) 5 MG tablet Take 2.5 mg by mouth daily.  . ondansetron (ZOFRAN) 4 MG tablet Take 4 mg by mouth every 8 (eight) hours as needed for nausea or vomiting.  . predniSONE (DELTASONE) 20 MG tablet Take 1 tablet (20 mg total) by mouth daily with breakfast. 10 days  . Roflumilast (DALIRESP) 250 MCG TABS Take 1 tablet by mouth daily.  . rosuvastatin (CRESTOR) 10 MG tablet Take 10 mg by mouth at bedtime.  . sucralfate (CARAFATE) 1 g tablet Take 1 g by mouth 4 (four) times daily -  with meals and at bedtime.  Marland Kitchen tiotropium (SPIRIVA) 18 MCG inhalation capsule Place 18 mcg into inhaler and inhale daily.  . traMADol (ULTRAM) 50 MG tablet Take 1 tablet (50 mg total) by mouth every 12 (twelve) hours as needed. For chronic pain syndrome  . vitamin B-12 (CYANOCOBALAMIN) 500 MCG tablet Take 500 mcg by mouth daily.  . Vitamin D, Cholecalciferol, 1000 units CAPS Take by mouth.     Allergies:   Aspirin, Atorvastatin, Nsaids, Promethazine hcl, and Sulfa antibiotics   Social History   Socioeconomic History  . Marital status: Married    Spouse name:  Not on file  . Number of children: Not on file  . Years of education: Not on file  . Highest education level: Not on file  Occupational History  . Not on file  Tobacco Use  . Smoking status: Current Some Day Smoker    Packs/day: 2.00    Years: 55.00    Pack years: 110.00    Types: Cigarettes  . Smokeless tobacco: Never Used  . Tobacco comment: 1 pack per week-08/01/2020  Substance and Sexual Activity  . Alcohol use: No  . Drug use: No  . Sexual activity: Not on file  Other Topics Concern  . Not on file  Social History Narrative  . Not on file   Social Determinants of Health   Financial Resource Strain: Not  on file  Food Insecurity: Not on file  Transportation Needs: Not on file  Physical Activity: Not on file  Stress: Not on file  Social Connections: Not on file     Family History: The patient's family history is not on file.  ROS:   Please see the history of present illness.     All other systems reviewed and are negative.  EKGs/Labs/Other Studies Reviewed:    The following studies were reviewed today:   EKG:  EKG not ordered today.   Recent Labs: No results found for requested labs within last 8760 hours.  Recent Lipid Panel No results found for: CHOL, TRIG, HDL, CHOLHDL, VLDL, LDLCALC, LDLDIRECT   Risk Assessment/Calculations:      Physical Exam:    VS:  BP 124/60 (BP Location: Left Arm, Patient Position: Sitting, Cuff Size: Normal)   Pulse 85   Ht 5\' 4"  (1.626 m)   Wt 161 lb (73 kg)   SpO2 99%   BMI 27.64 kg/m     Wt Readings from Last 3 Encounters:  09/11/20 161 lb (73 kg)  09/05/20 155 lb (70.3 kg)  08/11/20 155 lb (70.3 kg)     GEN:  Well nourished, well developed in no acute distress HEENT: Normal NECK: No JVD; No carotid bruits LYMPHATICS: No lymphadenopathy CARDIAC: RRR, no murmurs, rubs, gallops RESPIRATORY: Rhonchi at bases, expiratory wheezing ABDOMEN: Soft, non-tender, non-distended MUSCULOSKELETAL:  No edema; sternal  tenderness noted with palpation. SKIN: Warm and dry NEUROLOGIC:  Alert and oriented x 3 PSYCHIATRIC:  Normal affect   ASSESSMENT:    1. Coronary artery disease involving native coronary artery of native heart, unspecified whether angina present   2. Primary hypertension   3. Smoking    PLAN:    In order of problems listed above:  1. Chest pain is noncardiac with reproducibility on palpation. three-vessel coronary artery calcification noted on chest CT. echocardiogram 08/2020 showed normal systolic function, impaired relaxation, EF 60 to 65%.  Lexiscan Myoview with no ischemia, low risk study.  Emphysematous changes noted.  Continue Crestor, not on aspirin due to significant reflux and GI irritation.  On PPIs, Carafate.  Recommend she obtain lipid panel with primary care provider as scheduled.  Dentures starting as needed to control LDL.  Goal LDL less than 70. 2. Hypertension, BP controlled.  Continue current BP meds. 3. Current smoker, cessation advised.  Follow up in 1 year     Medication Adjustments/Labs and Tests Ordered: Current medicines are reviewed at length with the patient today.  Concerns regarding medicines are outlined above.  No orders of the defined types were placed in this encounter.  No orders of the defined types were placed in this encounter.   Patient Instructions  Medication Instructions:  Your physician recommends that you continue on your current medications as directed. Please refer to the Current Medication list given to you today.  *If you need a refill on your cardiac medications before your next appointment, please call your pharmacy*   Lab Work: None ordered If you have labs (blood work) drawn today and your tests are completely normal, you will receive your results only by: Marland Kitchen MyChart Message (if you have MyChart) OR . A paper copy in the mail If you have any lab test that is abnormal or we need to change your treatment, we will call you to  review the results.   Testing/Procedures: None ordered   Follow-Up: At [facility], you and your health needs are our priority.  As part of our continuing mission to provide you with exceptional heart care, we have created designated Provider Care Teams.  These Care Teams include your primary Cardiologist (physician) and Advanced Practice Providers (APPs -  Physician Assistants and Nurse Practitioners) who all work together to provide you with the care you need, when you need it.  We recommend signing up for the patient portal called "MyChart".  Sign up information is provided on this After Visit Summary.  MyChart is used to connect with patients for Virtual Visits (Telemedicine).  Patients are able to view lab/test results, encounter notes, upcoming appointments, etc.  Non-urgent messages can be sent to your provider as well.   To learn more about what you can do with MyChart, go to [email or web address].    Your next appointment:   1 year(s)  The format for your next appointment:   In Person  Provider:   Kate Sable, MD   Other Instructions      Signed, Kate Sable, MD  09/11/2020 12:01 PM    [place]

## 2020-09-13 ENCOUNTER — Inpatient Hospital Stay: Payer: Medicare Other | Attending: Oncology | Admitting: Oncology

## 2020-09-13 ENCOUNTER — Other Ambulatory Visit: Payer: Self-pay

## 2020-09-13 ENCOUNTER — Ambulatory Visit
Admission: RE | Admit: 2020-09-13 | Discharge: 2020-09-13 | Disposition: A | Discharge status: Home / Self Care | Payer: Medicare Other | Source: Ambulatory Visit | Attending: Oncology | Admitting: Oncology

## 2020-09-13 DIAGNOSIS — Z87891 Personal history of nicotine dependence: Secondary | ICD-10-CM | POA: Diagnosis not present

## 2020-09-13 DIAGNOSIS — Z122 Encounter for screening for malignant neoplasm of respiratory organs: Secondary | ICD-10-CM | POA: Diagnosis present

## 2020-09-13 DIAGNOSIS — F17200 Nicotine dependence, unspecified, uncomplicated: Secondary | ICD-10-CM | POA: Insufficient documentation

## 2020-09-13 DIAGNOSIS — F17210 Nicotine dependence, cigarettes, uncomplicated: Secondary | ICD-10-CM | POA: Diagnosis not present

## 2020-09-13 NOTE — Progress Notes (Signed)
Virtual Visit via Video Note  I connected with Judy Erickson on 09/13/20 at 10:30 AM EDT by a video enabled telemedicine application and verified that I am speaking with the correct person using two identifiers.  Location: Patient: [facility] Provider: Clinic    I discussed the limitations of evaluation and management by telemedicine and the availability of in person appointments. The patient expressed understanding and agreed to proceed.  I discussed the assessment and treatment plan with the patient. The patient was provided an opportunity to ask questions and all were answered. The patient agreed with the plan and demonstrated an understanding of the instructions.   The patient was advised to call back or seek an in-person evaluation if the symptoms worsen or if the condition fails to improve as anticipated.   In accordance with CMS guidelines, patient has met eligibility criteria including age, absence of signs or symptoms of lung cancer.  Social History   Tobacco Use  . Smoking status: Current Some Day Smoker    Packs/day: 2.00    Years: 55.00    Pack years: 110.00    Types: Cigarettes  . Smokeless tobacco: Never Used  . Tobacco comment: 1 pack per week-08/01/2020  Substance Use Topics  . Alcohol use: No  . Drug use: No      A shared decision-making session was conducted prior to the performance of CT scan. This includes one or more decision aids, includes benefits and harms of screening, follow-up diagnostic testing, over-diagnosis, false positive rate, and total radiation exposure.   Counseling on the importance of adherence to annual lung cancer LDCT screening, impact of co-morbidities, and ability or willingness to undergo diagnosis and treatment is imperative for compliance of the program.   Counseling on the importance of continued smoking cessation for former smokers; the importance of smoking cessation for current smokers, and information about tobacco cessation interventions  have been given to patient including [place] and [place] quit [place] programs.   Written order for lung cancer screening with LDCT has been given to the patient and any and all questions have been answered to the best of my abilities.    Yearly follow up will be coordinated by Burgess Estelle, Thoracic Navigator.  I provided 15 minutes of face-to-face video visit time during this encounter, and > 50% was spent counseling as documented under my assessment & plan.   Jacquelin Hawking, NP

## 2020-09-15 ENCOUNTER — Telehealth: Payer: Self-pay | Admitting: *Deleted

## 2020-09-15 NOTE — Telephone Encounter (Signed)
Notified patient of LDCT lung cancer screening program results with recommendation for 6 month follow up imaging. Also notified of incidental findings noted below and is encouraged to discuss further with PCP who will receive a copy of this note and/or the CT report. Patient verbalizes understanding.   IMPRESSION: 1. Lung-RADS 3, probably benign findings. 7.9 mm posterior left upper lobe pulmonary nodule is most likely scarring, but short-term follow-up in 6 months is recommended with repeat low-dose chest CT without contrast (please use the following order, "CT CHEST LCS NODULE FOLLOW-UP W/O CM"). 2. Aortic Atherosclerosis (ICD10-I70.0) and Emphysema (ICD10-J43.9).

## 2020-09-25 ENCOUNTER — Encounter: Admit: 2020-09-25 | Discharge: 2020-09-25 | Payer: MEDICARE

## 2020-09-25 ENCOUNTER — Encounter
Admit: 2020-09-25 | Discharge: 2020-09-25 | Payer: MEDICARE | Attending: Student in an Organized Health Care Education/Training Program | Primary: Student in an Organized Health Care Education/Training Program

## 2020-09-25 ENCOUNTER — Other Ambulatory Visit: Payer: Self-pay | Admitting: Internal Medicine

## 2020-09-25 DIAGNOSIS — Z79899 Other long term (current) drug therapy: Principal | ICD-10-CM

## 2020-09-25 DIAGNOSIS — L309 Dermatitis, unspecified: Principal | ICD-10-CM

## 2020-09-25 DIAGNOSIS — Z1159 Encounter for screening for other viral diseases: Principal | ICD-10-CM

## 2020-09-25 DIAGNOSIS — Z7289 Other problems related to lifestyle: Principal | ICD-10-CM

## 2020-09-25 MED ORDER — PREDNISONE 10 MG TABLET
ORAL_TABLET | ORAL | 0 refills | 0.00000 days | Status: CP
Start: 2020-09-25 — End: ?

## 2020-09-29 DIAGNOSIS — L309 Dermatitis, unspecified: Principal | ICD-10-CM

## 2020-09-29 MED ORDER — DUPILUMAB 300 MG/2 ML SUBCUTANEOUS PEN INJECTOR
SUBCUTANEOUS | 11 refills | 0.00000 days | Status: CP
Start: 2020-09-29 — End: ?
  Filled 2020-10-05: qty 4, 14d supply, fill #0

## 2020-09-29 MED ORDER — PREDNISONE 10 MG TABLET
ORAL_TABLET | ORAL | 0 refills | 0.00000 days | Status: CP
Start: 2020-09-29 — End: ?

## 2020-10-02 DIAGNOSIS — L309 Dermatitis, unspecified: Principal | ICD-10-CM

## 2020-10-04 NOTE — Unmapped (Signed)
[facility] Specialty Medication Onboarding    Specialty Medication: Dupixent 300 mg/2 mL Pnij  Prior Authorization: Approved   Financial Assistance: No - copay  <$25  Final Copay/Day Supply: $0.00 / 14 for loading and 28 for maintenance    Insurance Restrictions: None     Notes to Pharmacist: n/a    The triage team has completed the benefits investigation and has determined that the patient is able to fill this medication at [facility]. Please contact the patient to complete the onboarding or follow up with the prescribing physician as needed.

## 2020-10-05 MED ORDER — EMPTY CONTAINER
2 refills | 0 days
Start: 2020-10-05 — End: ?

## 2020-10-05 MED FILL — EMPTY CONTAINER: 120 days supply | Qty: 1 | Fill #0

## 2020-10-05 NOTE — Unmapped (Signed)
[facility]   Patient Onboarding/Medication Counseling    Lisa Andrews is a 70 y.o. female with atopic dermatitis who I am counseling today on initiation of therapy.  I am speaking to the patient.    Was a Nurse, learning disability used for this call? No    Verified patient's date of birth / HIPAA.    Specialty medication(s) to be sent: Inflammatory Disorders: Dupixent      Non-specialty medications/supplies to be sent: sharps kit      Medications not needed at this time: na         Dupixent (dupilumab)    Medication & Administration     Dosage: Atopic dermatitis: Inject 600mg  under the skin as a loading dose followed by 300mg  every 14 days thereafter    Administration:     Dupixent Pen  1. Gather all supplies needed for injection on a clean, flat working surface: medication syringe removed from packaging, alcohol swab, sharps container, etc.  2. Look at the medication label - look for correct medication, correct dose, and check the expiration date  3. Look at the medication - the liquid in the pen should appear clear and colorless to pale yellow  4. Lay the pen on a flat surface and allow it to warm up to room temperature for at least 45 minutes  5. Select injection site - you can use the front of your thigh or your belly (but not the area 2 inches around your belly button); if someone else is giving you the injection you can also use your upper arm in the skin covering your triceps muscle  6. Prepare injection site - wash your hands and clean the skin at the injection site with an alcohol swab and let it air dry, do not touch the injection site again before the injection  7. Hold the middle of the body of the pen and gently pull the needle safety cap straight out. Be careful not to bend the needle. Do not remove until immediately prior to injection  8. Press the pen down onto the injection site at a 90 degree angle.   9. You will hear a click as the injection starts, and then a second click when the injection is ALMOST done. Keep holding the pen against the skin for 5 more seconds after the second click.   10. Check that the pen is empty by looking in the viewing window - the yellow indicator bar should be stopped, and should fill the window.   11. Remove the pen from the skin by lifting straight up.   12. Dispose of the used pen immediately in your sharps disposal container  13. If you see any blood at the injection site, press a cotton ball or gauze on the site and maintain pressure until the bleeding stops, do not rub the injection site    Adherence/Missed dose instructions:  If a dose is missed, administer within 7 days from the missed dose and then resume the original schedule. If the missed dose is not administered within 7 days, you can either wait until the next dose on the original schedule or take your dose now and resume every 14 days from the new injection date. Do not use 2 doses at the same time or extra doses.      Goals of Therapy     -Reduce symptoms of pruritus and dermatitis  -Prevent exacerbations  -Minimize therapeutic risks  -Avoidance of long-term systemic and topical glucocorticoid use  -  Maintenance of effective psychosocial functioning    Side Effects & Monitoring Parameters     ??? Injection site reaction (redness, irritation, inflammation localized to the site of administration)  ??? Signs of a common cold ??? minor sore throat, runny or stuffy nose, etc.  ??? Recurrence of cold sores (herpes simplex)      The following side effects should be reported to the provider:  ??? Signs of a hypersensitivity reaction ??? rash; hives; itching; red, swollen, blistered, or peeling skin; wheezing; tightness in the chest or throat; difficulty breathing, swallowing, or talking; swelling of the mouth, face, lips, tongue, or throat; etc.  ??? Eye pain or irritation or any visual disturbances  ??? Shortness of breath or worsening of breathing      Contraindications, Warnings, & Precautions     ??? Have your bloodwork checked as you have been told by your prescriber   ??? Birth control pills and other hormone-based birth control may not work as well to prevent pregnancy  ??? Talk with your doctor if you are pregnant, planning to become pregnant, or breastfeeding  ??? Discuss the possible need for holding your dose(s) of Dupixent?? when a planned procedure is scheduled with the prescriber as it may delay healing/recovery timeline       Drug/Food Interactions     ??? Medication list reviewed in Epic. The patient was instructed to inform the care team before taking any new medications or supplements. No drug interactions identified.   ??? Talk with you prescriber or pharmacist before receiving any live vaccinations while taking this medication and after you stop taking it    Storage, Handling Precautions, & Disposal     ??? Store this medication in the refrigerator.  Do not freeze  ??? If needed, you may store at room temperature for up to 14 days  ??? Store in original packaging, protected from light  ??? Do not shake  ??? Dispose of used syringes in a sharps disposal container              Current Medications (including OTC/herbals), Comorbidities and Allergies     Current Outpatient Medications   Medication Sig Dispense Refill   ??? albuterol (VENTOLIN HFA) 90 mcg/actuation inhaler Inhale 2 puffs every four (4) hours as needed.      ??? albuterol 2.5 mg /3 mL (0.083 %) nebulizer solution Inhale 2.5 mg by nebulization every four (4) hours as needed for wheezing.     ??? buPROPion (WELLBUTRIN XL) 300 MG 24 hr tablet Take 300 mg by mouth every morning.      ??? busPIRone (BUSPAR) 5 MG tablet Take 5 mg by mouth two (2) times a day.     ??? cetirizine (ZYRTEC) 10 MG tablet Take 1 tablet by mouth daily.     ??? cholecalciferol, vitamin D3, 1,000 unit capsule Take 2,000 Units by mouth daily.      ??? cimetidine (TAGAMET) 400 MG tablet Take 400 mg by mouth daily.     ??? clobetasoL (TEMOVATE) 0.05 % cream Apply twice a day to affected areas of body.  Avoid face and folds 60 g 6   ??? cyanocobalamin, vitamin B-12, (VITAMIN B-12 ORAL) Take by mouth daily.     ??? dexlansoprazole 60 mg capsule Take 60 mg by mouth daily. 90 capsule 3   ??? dicyclomine (BENTYL) 20 mg tablet Take 20 mg by mouth Every six (6) hours. ONLY TAKING 2 A DAY 20 tablet 1   ??? dupilumab 300 mg/2 mL  PnIj Inject the contents of 2 pens (600 mg) under the skin once for loading. 4 mL 0   ??? dupilumab 300 mg/2 mL PnIj Inject the contents of one pen (300 mg) under the skin every two weeks as maintenance. 4 mL 11   ??? empty container Misc Use as directed to dispose of Dupixent pens. 1 each 2   ??? fluticasone furoate-vilanterol (BREO ELLIPTA) 100-25 mcg/dose inhaler Inhale 1 puff daily.     ??? fluticasone propionate (FLONASE) 50 mcg/actuation nasal spray 2 sprays into each nostril daily. 16 g 12   ??? gabapentin (NEURONTIN) 300 MG capsule Take 1 capsule by mouth twice daily and take 2 capsules every night at bedtime. 360 capsule 3   ??? hydroCHLOROthiazide (HYDRODIURIL) 12.5 MG tablet Take 12.5 mg by mouth daily.     ??? levalbuterol (XOPENEX) 0.63 mg/3 mL nebulizer solution Inhale 1 ampule by nebulization every four (4) hours as needed for wheezing.     ??? linaclotide (LINZESS) 145 mcg capsule Take 145 mcg by mouth.     ??? losartan (COZAAR) 100 MG tablet TAKE 1 TABLET BY MOUTH ONCE DAILY 90 tablet 1   ??? meclizine (ANTIVERT) 12.5 mg tablet Take 12.5 mg by mouth Three (3) times a day as needed.     ??? meTHIMazole (TAPAZOLE) 5 MG tablet Take 2.5 mg by mouth daily.      ??? ondansetron (ZOFRAN-ODT) 4 MG disintegrating tablet Take 4 mg by mouth every eight (8) hours as needed.      ??? polyethylene glycol (GLYCOLAX) 17 gram/dose powder 17 gm (one capful) with 8 ounces of liquid 510 g 12   ??? predniSONE (DELTASONE) 10 MG tablet Take by mouth 4 tabs/day x5d, then 3 tabs/day x5d, then 2 tabs/day x5d, then 1 tab/day x5d, then 1/2 tab/day x5d 63 tablet 0   ??? rosuvastatin (CRESTOR) 10 MG tablet Take 1 tablet (10 mg total) by mouth nightly. 90 tablet 3   ??? rosuvastatin (CRESTOR) 10 MG tablet Take 10 mg by mouth.     ??? sucralfate (CARAFATE) 1 gram tablet Take 1 g by mouth Four (4) times a day.     ??? tiotropium (SPIRIVA WITH HANDIHALER) 18 mcg inhalation capsule PLACE 1 CAPSULE IN INHALER AND INHALE ONCE DAILY     ??? traMADoL (ULTRAM) 50 mg tablet      ??? triamcinolone (KENALOG) 0.1 % cream APPLY TO AFFECTED AREA ON BACK TWICE DAILY FOR 2 WEEKS OR UNTIL RESOLVED 454 g 2     No current facility-administered medications for this visit.       Allergies   Allergen Reactions   ??? Nsaids (Non-Steroidal Anti-Inflammatory Drug)      Pt with severe GERD/pud, med interaction   ??? Promethazine Hcl      Medication Interaction   ??? Trimethoprim      rash   ??? Aspirin Nausea And Vomiting     PUD and nausea with aspirin and NSAIDS   ??? Lipitor [Atorvastatin] Muscle Pain   ??? Sulfa (Sulfonamide Antibiotics) Rash   ??? Sulfamethoxazole Rash       Patient Active Problem List   Diagnosis   ??? Constipation   ??? Irritable colon   ??? Urinary tract infection SEES UROLOGY   ??? Vitamin B12 deficiency   ??? Seizure disorder (CMS-HCC)   ??? Tobacco use   ??? Sleep apnea - does not use CPAP - has 2LO2 at night   ??? Eczema   ??? Allergic rhinitis   ??? Essential  hypertension   ??? GERD (gastroesophageal reflux disease)   ??? Bipolar affect, depressed - SEES PSYCHIATRY   ??? Hyperthyroidism   ??? Osteopenia of lumbar spine   ??? Asthma   ??? Foot pain, bilateral   ??? Dermatitis   ??? Disorder of bone and cartilage   ??? Tobacco use disorder   ??? Moderate COPD - sees PULMONOLOGY  (RAF-HCC)   ??? Vitamin B-complex deficiency   ??? Bell's palsy   ??? Arthritis   ??? Headache   ??? COPD with acute exacerbation (CMS-HCC)   ??? Post herpetic neuralgia   ??? Encounter for long-term current use of medication   ??? Splenic artery aneurysm (CMS-HCC)   ??? Family circumstance NOS   ??? Mixed hyperlipidemia   ??? Excessive tearing, right   ??? History of vertigo   ??? Noncompliance   ??? Prediabetes   ??? Phlebitis   ??? Onychomycosis   ??? Osteoarthritis   ??? Memory deficit   ??? COPD, moderate (CMS-HCC)   ??? At high risk for falls   ??? RLS (restless legs syndrome)   ??? Sciatica of right side   ??? Gastritis with intestinal metaplasia of stomach   ??? Adenomatous polyp of colon   ??? Chronic bilateral low back pain with bilateral sciatica   ??? Lumbar stenosis with neurogenic claudication       Reviewed and up to date in Epic.    Appropriateness of Therapy     Acute infections noted within Epic:  No active infections  Patient reported infection: None    Is medication and dose appropriate based on diagnosis and infection status? Yes    Prescription has been clinically reviewed: Yes      Baseline Quality of Life Assessment      How many days over the past month did your ad  keep you from your normal activities? For example, brushing your teeth or getting up in the morning. daily - has had severe itching, difficult sleeping    Financial Information     Medication Assistance provided: Prior Authorization    Anticipated copay of $0 reviewed with patient. Verified delivery address.    Delivery Information     Scheduled delivery date: Thurs, 4/28 for loading (no maintenance set up yet)    Expected start date: Friday, 4/29    Medication will be delivered via Same Day Courier to the prescription address in [facility].  This shipment will not require a signature.      Explained the services we provide at [facility] and that each month we would call to set up refills.  Stressed importance of returning phone calls so that we could ensure they receive their medications in time each month.  Informed patient that we should be setting up refills 7-10 days prior to when they will run out of medication.  A pharmacist will reach out to perform a clinical assessment periodically.  Informed patient that a welcome packet, containing information about our pharmacy and other support services, a Notice of Privacy Practices, and a drug information handout will be sent.      Patient verbalized understanding of the above information as well as how to contact the pharmacy at [phone] option 4 with any questions/concerns.  The pharmacy is open Monday through Friday 8:30am-4:30pm.  A pharmacist is available 24/7 via pager to answer any clinical questions they may have.    Patient Specific Needs     - Does the patient have any physical, cognitive,  or cultural barriers? No    - Patient prefers to have medications discussed with  Patient     - Is the patient or caregiver able to read and understand education materials at a high school level or above? Yes    - Patient's primary language is  English     - Is the patient high risk? No    - Does the patient require a Care Management Plan? No     - Does the patient require physician intervention or other additional services (i.e. nutrition, smoking cessation, social work)? No      Maisee Vollman A Desiree Lucy [facility] Specialty Pharmacist

## 2020-10-06 NOTE — Unmapped (Signed)
[facility] Pharmacist Intervention    Type of intervention: Mis-fire    Medication: Dupixent    Problem: Patient mis-fired first pen, and called into the pharmacy for assistance. I walked her through the second injection, but the second pen misfired as well.     Intervention:  I walked her through the second injection, but the second pen misfired as well. She placed the pen against her belly, pushed down, but it never clicked. When she finally removed the pen from her belly, it activated and spilled everywhere. I helped her report the issue to [facility] - case number [id number].     I'll tag her dermatologists to see if she could come to clinic for the first dose. Lisa Andrews would prefer this, if possible.       Follow up needed: NA/ once medication is received by the pharmacy, we'll reach out to her to reschedule    Approximate time spent: 35 minutes    Lisa Andrews A Desiree Lucy [facility] Specialty Pharmacist

## 2020-10-09 NOTE — Unmapped (Signed)
Hello Lisa Andrews,     From the understanding I have we cannot schd patient's to come in with their own medication and show them how to use the injection because of Covid     Thanks  [facility]

## 2020-10-10 ENCOUNTER — Ambulatory Visit (INDEPENDENT_AMBULATORY_CARE_PROVIDER_SITE_OTHER): Payer: Medicare Other | Admitting: Internal Medicine

## 2020-10-10 ENCOUNTER — Other Ambulatory Visit: Payer: Self-pay

## 2020-10-10 ENCOUNTER — Encounter: Payer: Self-pay | Admitting: Internal Medicine

## 2020-10-10 VITALS — BP 126/74 | HR 100 | Temp 97.7°F | Ht 64.0 in | Wt 168.0 lb

## 2020-10-10 DIAGNOSIS — R0689 Other abnormalities of breathing: Secondary | ICD-10-CM | POA: Diagnosis not present

## 2020-10-10 DIAGNOSIS — J9611 Chronic respiratory failure with hypoxia: Secondary | ICD-10-CM | POA: Diagnosis not present

## 2020-10-10 DIAGNOSIS — J441 Chronic obstructive pulmonary disease with (acute) exacerbation: Secondary | ICD-10-CM | POA: Diagnosis not present

## 2020-10-10 MED ORDER — AMOXICILLIN-POT CLAVULANATE 875-125 MG PO TABS: 1.0000 | ORAL_TABLET | Freq: Two times a day (BID) | ORAL | 0 refills | Status: AC

## 2020-10-10 NOTE — Progress Notes (Signed)
Name: Judy Erickson MRN: [id number] DOB: 11/25/1950     CONSULTATION DATE: 10/10/2020 SYNOPSIS 70 year old white female seen today for progressive shortness of breath Patient with a diagnosis of COPD for the last 15 years Patient also diagnosis of chronic respiratory failure in the setting of COPD Has been on oxygen for many many years Patient has recent diagnosis of COPD exacerbation and was admitted to the hospital on February 4 Patient was discharged with antibiotics and prednisone At this time patient has increased work of breathing shortness of breath positive wheezing cough Patient still actively smokes  STUDIES:     CT chest Independently reviewed by Me today from  08/2019   Moderate centrilobular emphysema. There is minimal peripheral reticular interstitial opacity and some ground-glass in the preserved airspaces, particularly in the bilateral lower lungs. No significant air trapping on expiratory phase imaging. No pleural effusion or pneumothorax.   CC  follow up COPD  HISTORY OF PRESENT ILLNESS: Patient is very weak Increased work of breathing Has COPD exacerbation impending respiratory Patient refuses to go to the ER  Chronic prednisone He exposed to cats Uses nebulized therapy Uses inhaler therapy Uses oxygen therapy  Multiple falls at home   +exacerbation at this time No evidence of heart failure at this time +evidence or signs of infection at this time No respiratory distress No fevers, chills, nausea, vomiting, diarrhea No evidence of lower extremity edema No evidence hemoptysis  Patient quit smoking 1.5 months ago  I have explained to patient that she is at her end-stage with her lung disease Patient will have recurrent bouts of suffocation Recurrent bouts of infection Her quality of life has significantly declined over the last several years    PAST MEDICAL HISTORY :   has a past medical history of Abdominal hernia, Allergic rhinitis due  to allergen, Arthritis, Asthma, Bell's palsy, Bipolar disorder ([place]), COPD (chronic obstructive pulmonary disease) ([place]), Depression, Eczema, Genital warts, Genital warts, GERD (gastroesophageal reflux disease), Headache, History of migraine headaches, Hypertension, Osteoporosis, Pulmonary nodules, Seizures ([place]), Sleep apnea, Thyroid disease, Tobacco use disorder, Tremor, Vitamin B12 deficiency, and Vitamin D deficiency.  has a past surgical history that includes Abdominal hysterectomy; Cholecystectomy; Hernia repair; Colonoscopy with propofol (N/A, 08/28/2015); Esophagogastroduodenoscopy (egd) with propofol (N/A, 08/28/2015); and Esophagogastroduodenoscopy (N/A, 08/02/2016). Prior to Admission medications   Medication Sig Start Date End Date Taking? Authorizing Provider  acetaminophen (TYLENOL) 500 MG tablet Take 1,000 mg by mouth.   Yes [provider]  albuterol (PROVENTIL) (2.5 MG/3ML) 0.083% nebulizer solution Take 2.5 mg by nebulization every 6 (six) hours as needed for wheezing or shortness of breath.   Yes [provider]  albuterol (VENTOLIN HFA) 108 (90 Base) MCG/ACT inhaler Inhale into the lungs every 6 (six) hours as needed for wheezing or shortness of breath.   Yes [provider]  buPROPion (WELLBUTRIN XL) 300 MG 24 hr tablet Take 300 mg by mouth daily.   Yes [provider]  busPIRone (BUSPAR) 5 MG tablet Take 5 mg by mouth 2 (two) times daily. 03/02/20  Yes [provider]  cimetidine (TAGAMET) 400 MG tablet Take 400 mg by mouth at bedtime. 04/17/20  Yes [provider]  clobetasol ointment (TEMOVATE) 4.40 % Apply 1 application topically 2 (two) times daily.   Yes [provider]  dexlansoprazole (DEXILANT) 60 MG capsule Take 60 mg by mouth daily.   Yes [provider]  dicyclomine (BENTYL) 20 MG tablet Take 20 mg by mouth every 6 (six)  hours.   Yes [provider]  fluticasone furoate-vilanterol (BREO ELLIPTA)  100-25 MCG/INH AEPB INHALE 1 INHALATION INTO THE LUNGS ONCE DAILY 04/17/20  Yes [provider]  gabapentin (NEURONTIN) 300 MG capsule Take 300 mg by mouth 3 (three) times daily.   Yes [provider]  hydrochlorothiazide (HYDRODIURIL) 12.5 MG tablet Take 12.5 mg by mouth daily. 04/17/20  Yes [provider]  HYDROcodone-homatropine (HYCODAN) 5-1.5 MG/5ML syrup Take by mouth. 08/14/18  Yes [provider]  ipratropium (ATROVENT) 0.06 % nasal spray Place 2 sprays into both nostrils 4 (four) times daily.   Yes [provider]  levalbuterol Penne Lash) 0.63 MG/3ML nebulizer solution Inhale into the lungs. 09/23/19  Yes [provider]  Linaclotide (LINZESS) 145 MCG CAPS capsule Take 145 mcg by mouth daily.   Yes [provider]  LORazepam (ATIVAN) 1 MG tablet Take 1 mg by mouth every 8 (eight) hours. Take 1/2 tablet by mouth three times a day as needed   Yes [provider]  losartan (COZAAR) 100 MG tablet Take 100 mg by mouth daily.   Yes [provider]  meclizine (ANTIVERT) 12.5 MG tablet Take 12.5 mg by mouth 3 (three) times daily as needed. 11/15/19  Yes [provider]  methimazole (TAPAZOLE) 5 MG tablet Take 2.5 mg by mouth daily. 04/17/20  Yes [provider]  ondansetron (ZOFRAN) 4 MG tablet Take 4 mg by mouth every 8 (eight) hours as needed for nausea or vomiting.   Yes [provider]  predniSONE (DELTASONE) 5 MG tablet Take 5 mg by mouth daily. 04/17/20  Yes [provider]  Roflumilast (DALIRESP) 250 MCG TABS Take by mouth.   Yes [provider]  sucralfate (CARAFATE) 1 g tablet Take 1 g by mouth 4 (four) times daily -  with meals and at bedtime.   Yes [provider]  tiotropium (SPIRIVA) 18 MCG inhalation capsule Place 18 mcg into inhaler and inhale daily.   Yes [provider]  vitamin B-12 (CYANOCOBALAMIN) 500 MCG tablet Take 500 mcg by mouth daily.   Yes  [provider]  Vitamin D, Cholecalciferol, 1000 units CAPS Take by mouth.   Yes [provider]   Allergies  Allergen Reactions  . Aspirin Nausea And Vomiting    PUD and nausea with aspirin and NSAIDS PUD and nausea with aspirin and NSAIDS   . Atorvastatin Other (See Comments)  . Nsaids   . Promethazine Hcl   . Sulfa Antibiotics     FAMILY HISTORY:  HTN SOCIAL HISTORY:  reports that she quit smoking about 2 months ago. Her smoking use included cigarettes. She has a 110.00 pack-year smoking history. She has never used smokeless tobacco. She reports that she does not drink alcohol and does not use drugs.     Review of Systems:  Gen: Increased work of breathing increased shortness of breath patient does not look good HEENT: Denies blurred vision, double vision, ear pain, eye pain, hearing loss, nose bleeds, sore throat Cardiac:  No dizziness, chest pain or heaviness, chest tightness,edema, No JVD Resp:  + cough, -sputum production, +shortness of breath,+wheezing, -hemoptysis,  Gi: Denies swallowing difficulty, stomach pain, nausea or vomiting, diarrhea, constipation, bowel incontinence Gu:  Denies bladder incontinence, burning urine Ext:   Denies Joint pain, stiffness or swelling Skin: Denies  skin rash, easy bruising or bleeding or hives Endoc:  Denies polyuria, polydipsia , polyphagia or weight change Psych:   Denies depression, insomnia or hallucinations  Other:  All other systems negative\    BP 126/74 (BP Location: Left Arm, Cuff Size: Normal)   Pulse 100   Temp 97.7 F (36.5 C) (Temporal)   Ht 5\' 4"  (1.626 m)   Wt 168 lb (76.2 kg) Comment: weight is per patient  SpO2 97%   BMI 28.84 kg/m   Physical Examination:   General Appearance: No distress  Neuro:without focal findings,  speech normal,  HEENT: PERRLA, EOM intact.   Pulmonary: normal breath sounds, No wheezing.  CardiovascularNormal S1,S2.  No m/r/g.   Abdomen: Benign, Soft,  non-tender. Renal:  No costovertebral tenderness  GU:  Not performed at this time. Endoc: No evident thyromegaly Skin:   warm, no rashes, no ecchymosis  Extremities: normal, no cyanosis, clubbing. PSYCHIATRIC: Mood, affect within normal limits.   ALL OTHER ROS ARE NEGATIVE    ASSESSMENT AND PLAN SYNOPSIS  70 year old white female seen today for follow-up extensive pulmonary history with ongoing tobacco abuse with severe end-stage pulmonary disease with COPD and emphysema  Signs and symptoms of chronic bronchitis with chronic wheezing and chronic shortness of breath dyspnea on exertion   Patient with very poor respiratory insufficiency with allergic rhinitis exposed to cats   Severe shortness of breath dyspnea exertion Related to COPD end-stage Gold stage D Continue oxygen as prescribed Continue inhalers as prescribed  Gold stage D COPD Progressive shortness of breath dyspnea exertion Traditional inhaler therapy may not be enough and adequate for this patient she will need nebulized therapy at some point + Exacerbation Continue prednisone Start Augmentin   Chronic productive cough and chronic bronchitis with emphysema Continue nebulizers as needed Incentive spirometry and flutter valve  Intermittent angina with significant smoking history Cardiology referral at previous visit Echo cardiogram at [facility] does not show significant heart failure symptoms does have increased pulmonary artery pressures   Patient has very poor prognosis with very poor respiratory insufficiency progressive end-stage lung disease in the setting of 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  Overall condition is very poor Patient is at high risk for recurrent infections   I advised patient to go to the ER for further evaluation and admitted for COPD exacerbation    MEDICATION ADJUSTMENTS/LABS AND TESTS ORDERED: Augmentin for COPD exacerbation Use albuterol nebulizer every 4 hours and  then use flutter valve and incentive spirometry breathing toys after every nebulizer therapy Continue Spiriva and continue Breo as prescribed Avoid cats Please stop smoking Continue oxygen as prescribed Recommend ER evaluation-patient refused   CURRENT MEDICATIONS REVIEWED AT LENGTH WITH PATIENT TODAY   Patient satisfied with Plan of action and management. All questions answered  Follow up in 3 months TOTAL TIME SPENT 47 mins  Zayveon Raschke Patricia Pesa, M.D.  [facility]  Medical Director [place] Director [facility] Cardio-Pulmonary Department

## 2020-10-10 NOTE — Patient Instructions (Addendum)
Use albuterol nebulizer every 4 hours and then use flutter valve and incentive spirometry breathing toys after every nebulizer therapy  Continue Spiriva and continue Breo as prescribed Avoid cats Please stop smoking Zyrtec 10 mg daily at nighttime Flonase 2 sprays each nostril daily Continue oxygen as prescribed   I advised patient to go to the ER to get treatment for COPD exacerbation however patient does not want to go to the ER and will call 911 if necessary

## 2020-10-12 NOTE — Unmapped (Addendum)
UPDATE 5/16 Darel Hong called to let us know she had not received her enrollment form yet. I called [employer] again with her on the line, and they informed us they had tried, but the mail bounced back due to address error. She re-confirmed the address, and the representative will re-send the form.     She was instructed to fill out Section 1 and to sign TWICE - below the email address and directly across from that on the form.     Additional time: 20 mins.       [facility] Pharmacist Intervention    Type of intervention: Injection training support     Medication: [employer]    Problem: [place] would like in-person injection training support after she mis-fired her first two pens. The clinic policies don't support in-person training, so we'll work to get her signed up for the [employer] program which offers nursing support.    Intervention: I called the program with Carlon on the line. They will plan to mail her an enrollment form for her to sign/return, and then have a nurse call her.    Follow up needed: yes, will f/u with Darel Hong in a few days to check on her/arrange delivery of her replacement pens.     Approximate time spent: 30 minutes    Lakyn Mantione A Desiree Lucy Shared [facility] Special[facility] Pharmacist

## 2020-10-27 ENCOUNTER — Telehealth: Payer: Self-pay | Admitting: Internal Medicine

## 2020-10-27 NOTE — Telephone Encounter (Signed)
Patient is returning phone call. Patient phone number is [phone].

## 2020-10-27 NOTE — Telephone Encounter (Signed)
ATC patient--unable to leave vm due to mailbox not being setup.  °

## 2020-10-27 NOTE — Telephone Encounter (Signed)
Called and spoke to patient.  Patient is requesting to increase oxygen to  From 2L to 3L.  She has been monitoring her oxygen levels and spo2 is maintaining between 96-97% on 2L.  She reports of increased sob with exertion and confusion. Sob worsened with laying flat. She has to sleep sitting up.  Cough and wheezing is baseline.  Denied fever, chills or sweats.   Dr. Patsey Berthold, please advise. Dr. Mortimer Fries is unavailable.

## 2020-10-27 NOTE — Telephone Encounter (Signed)
Patient is aware of below message and voiced her understanding.  Nothing further needed.   

## 2020-10-27 NOTE — Telephone Encounter (Signed)
Given the issues with confusion and inability to lay flat I am concerned this may be a cardiac (heart) issue.  I recommend she be evaluated in the emergency room.  Her oxygen levels are very good and she does not need to increase her oxygen flow.

## 2020-10-30 ENCOUNTER — Ambulatory Visit: Admit: 2020-10-30 | Discharge: 2020-10-30 | Disposition: A | Discharge status: Home / Self Care | Payer: MEDICARE

## 2020-10-30 ENCOUNTER — Emergency Department: Admit: 2020-10-30 | Discharge: 2020-10-30 | Disposition: A | Discharge status: Home / Self Care | Payer: MEDICARE

## 2020-10-30 ENCOUNTER — Other Ambulatory Visit: Payer: Self-pay | Admitting: Internal Medicine

## 2020-10-30 DIAGNOSIS — L309 Dermatitis, unspecified: Principal | ICD-10-CM

## 2020-10-30 DIAGNOSIS — J441 Chronic obstructive pulmonary disease with (acute) exacerbation: Principal | ICD-10-CM

## 2020-10-30 LAB — URINALYSIS WITH CULTURE REFLEX
BILIRUBIN UA: NEGATIVE
BLOOD UA: NEGATIVE
GLUCOSE UA: NEGATIVE
KETONES UA: NEGATIVE
LEUKOCYTE ESTERASE UA: NEGATIVE
NITRITE UA: NEGATIVE
PH UA: 5.5 (ref 5.0–9.0)
PROTEIN UA: NEGATIVE
RBC UA: <1 /HPF (ref <4)
SPECIFIC GRAVITY UA: 1.015 (ref 1.005–1.040)
SQUAMOUS EPITHELIAL: 8 /HPF — ABNORMAL HIGH (ref 0–5)
UROBILINOGEN UA: 0.2
WBC UA: <1 /HPF (ref 0–5)

## 2020-10-30 LAB — BASIC METABOLIC PANEL
ANION GAP: 4 mmol/L — ABNORMAL LOW (ref 5–14)
BLOOD UREA NITROGEN: 8 mg/dL — ABNORMAL LOW (ref 9–23)
BUN / CREAT RATIO: 10
CALCIUM: 10.1 mg/dL (ref 8.7–10.4)
CHLORIDE: 110 mmol/L — ABNORMAL HIGH (ref 98–107)
CO2: 25.7 mmol/L (ref 20.0–31.0)
CREATININE: 0.83 mg/dL — ABNORMAL HIGH
EGFR CKD-EPI (2021) FEMALE: 75 mL/min/{1.73_m2} (ref >=60)
GLUCOSE RANDOM: 149 mg/dL (ref 70–179)
POTASSIUM: 3.7 mmol/L (ref 3.4–4.8)
SODIUM: 140 mmol/L (ref 135–145)

## 2020-10-30 LAB — CBC W/ AUTO DIFF
BASOPHILS ABSOLUTE COUNT: 0.1 10*9/L (ref 0.0–0.1)
BASOPHILS RELATIVE PERCENT: 0.8 %
EOSINOPHILS ABSOLUTE COUNT: 0.1 10*9/L (ref 0.0–0.5)
EOSINOPHILS RELATIVE PERCENT: 0.7 %
HEMATOCRIT: 38.9 % (ref 34.0–44.0)
HEMOGLOBIN: 12.7 g/dL (ref 11.3–14.9)
LYMPHOCYTES ABSOLUTE COUNT: 2.1 10*9/L (ref 1.1–3.6)
LYMPHOCYTES RELATIVE PERCENT: 22.8 %
MEAN CORPUSCULAR HEMOGLOBIN CONC: 32.6 g/dL (ref 32.0–36.0)
MEAN CORPUSCULAR HEMOGLOBIN: 28.5 pg (ref 25.9–32.4)
MEAN CORPUSCULAR VOLUME: 87.5 fL (ref 77.6–95.7)
MEAN PLATELET VOLUME: 7.9 fL (ref 6.8–10.7)
MONOCYTES ABSOLUTE COUNT: 0.7 10*9/L (ref 0.3–0.8)
MONOCYTES RELATIVE PERCENT: 7.4 %
NEUTROPHILS ABSOLUTE COUNT: 6.2 10*9/L (ref 1.8–7.8)
NEUTROPHILS RELATIVE PERCENT: 68.3 %
NUCLEATED RED BLOOD CELLS: 0 /100{WBCs} (ref <=4)
PLATELET COUNT: 221 10*9/L (ref 150–450)
RED BLOOD CELL COUNT: 4.44 10*12/L (ref 3.95–5.13)
RED CELL DISTRIBUTION WIDTH: 15.1 % (ref 12.2–15.2)
WBC ADJUSTED: 9.1 10*9/L (ref 3.6–11.2)

## 2020-10-30 LAB — HIGH SENSITIVITY TROPONIN I - SINGLE: HIGH SENSITIVITY TROPONIN I: 6 ng/L (ref <=34)

## 2020-10-30 MED ORDER — GABAPENTIN 300 MG CAPSULE
ORAL_CAPSULE | 3 refills | 0 days
Start: 2020-10-30 — End: ?

## 2020-10-30 MED ORDER — FUROSEMIDE 20 MG TABLET
ORAL_TABLET | Freq: Every day | ORAL | 0 refills | 10.00000 days | Status: CP
Start: 2020-10-30 — End: 2020-11-09

## 2020-10-30 MED ORDER — LOSARTAN 100 MG TABLET
ORAL_TABLET | 1 refills | 0 days | Status: CP
Start: 2020-10-30 — End: ?

## 2020-10-30 MED ORDER — PREDNISONE 10 MG TABLET
ORAL_TABLET | 0 refills | 0 days | Status: CP
Start: 2020-10-30 — End: ?

## 2020-10-30 MED ADMIN — predniSONE (DELTASONE) tablet 40 mg: 40 mg | ORAL | @ 15:00:00 | Stop: 2020-10-30

## 2020-10-30 MED ADMIN — ipratropium-albuteroL (DUO-NEB) 0.5-2.5 mg/3 mL nebulizer solution 3 mL: 3 mL | RESPIRATORY_TRACT | @ 16:00:00 | Stop: 2020-10-30

## 2020-10-30 MED ADMIN — ipratropium-albuteroL (DUO-NEB) 0.5-2.5 mg/3 mL nebulizer solution 3 mL: 3 mL | RESPIRATORY_TRACT | @ 15:00:00 | Stop: 2020-10-30

## 2020-10-30 NOTE — Unmapped (Signed)
Patient is requesting the following refill  Requested Prescriptions     Pending Prescriptions Disp Refills   ??? losartan (COZAAR) 100 MG tablet [Pharmacy Med Name: LOSARTAN POTASSIUM 100 MG TAB] 90 tablet 1     Sig: TAKE 1 TABLET BY MOUTH ONCE DAILY       Recent Visits  Date Type Provider Dept   08/15/20 Office Visit Marca Ancona, MD [facility]   05/18/20 Office Visit Marca Ancona, MD [facility]   04/13/20 Office Visit Marca Ancona, MD [facility]   02/15/20 Office Visit Marca Ancona, MD [facility]   12/28/19 Office Visit Selina Cooley, MD [facility] [place]   Showing recent visits within past 365 days with a meds authorizing provider and meeting all other requirements  Future Appointments  Date Type Provider Dept   11/21/20 Appointment Marca Ancona, MD [facility] [place]   Showing future appointments within next 365 days with a meds authorizing provider and meeting all other requirements       Labs: Not applicable this refill

## 2020-10-30 NOTE — Unmapped (Signed)
Patient present shere with c/o shortness of breath/ increased dyspnea on exertion/ cough and wheezing which got worsened after she finished her antibiotics and steroids. Called her pulmonologist and was asked to got to ED for further evaluation. H/o emphysema

## 2020-10-30 NOTE — Unmapped (Signed)
Attending Note    This is a 70 y.o. presenting to the ED for shortness of breath. Patient reports several days of progressively worsening shortness of breath. Per chart review, patient presented to her pulmonologist on 10/10/20 for a COPD exacerbation. She was prescribed a steroid taper. Patient endorses compliance with her steroid taper, completing her course several days ago. Since completing her steroid taper, patient has had to increase her home O2 from 2 L to 3 L, particularly with ambulation and exertion. Patient additionally reports a chronic cough, unchanged from baseline. No precipitating factors or other changes.    Other wise patient denies: fever, chills, headache, chest pain, abd pain, nausea, vomiting, diarrhea, dysuria, rashes, bruises, or swollen glands.    Past Medical History:  History of asthma, COPD (on 2 L home O2), coronary artery calcifications, GERD, osteoporosis, sleep apnea, HTN, and hypothyroidism    Social History:  50 pack-per year smoking history (patient quit tobacco use approximately 2 months ago). Denies EtOH or drug use.    ROS:  As per HPI, otherwise 10 systems reviewed and negative.    Physical Exam:  Gen: Well appearing, NAD  HENT: OP clear  Eyes: no scleral icterus  Chest: Diffuse and expiratory wheezes and prolonged expiratory phase.   Cardiac: nl S1S2 s m/g/r  Abd: soft, nt  Skin: no rashes, no ecchymoses  Neuro: normal gait, speech, and balance  MSK: neck supple   Psych: normal mood, thought, and affect         Assessment/Plan:  70 year old presenting with shortness of breath.  The patient has COPD and her shortness of breath was exacerbated after completing a steroid taper.  She received duo nebs in the emergency department feels significantly better.    Her chest x-ray does demonstrate some pulmonary edema, she does not have a diagnosis of COPD but comparing this chest x-ray to previous ones it does not appear unchanged.    The patient takes hydrochlorothiazide.  I have asked her to hold this and will use a brief course of Lasix to see if this is beneficial.  I will also restart her steroids.    Patient's COVID test is negative.    Patient will follow-up with primary care but return to the emergency department for worsening symptoms, new symptoms or other concerns.    I did offer admission to this patient, but she refused.  Given that she is unwilling to stay in the hospital I have decided on the therapeutic plan above as the next best possible course.  The patient understands that there is risk of being discharged home but prefers the course proposed above to being admitted to the hospital.      Documentation assistance was provided by Malena Catholic, Scribe on Oct 30, 2020 at 10:38 AM for Betsy Pries, MD.    Documentation assistance was provided by the scribe in my presence.  The documentation recorded by the scribe has been reviewed by me and accurately reflects the services I personally performed.         Chauncey Reading, MD  10/30/20 [phone]

## 2020-11-01 DIAGNOSIS — L309 Dermatitis, unspecified: Principal | ICD-10-CM

## 2020-11-01 MED ORDER — DUPIXENT 300 MG/2 ML SUBCUTANEOUS PEN INJECTOR
Freq: Once | SUBCUTANEOUS | 0 refills | 14.00000 days | Status: CP
Start: 2020-11-01 — End: 2020-11-01
  Filled 2020-11-07: qty 4, 14d supply, fill #0

## 2020-11-01 NOTE — Unmapped (Signed)
Lisa Andrews    Patient stated she received a letter from [facility] saying they wanted her to mail back the two injections that didn't work. She has to still do that.  She has not been able to get in contact with nurse for support with injection. Informed her that nurse support from [facility] is a separate issue and she can still request that nurse support independent of mailing back replacement medication.  Patient verified understanding and will call them again.     Patient is in the process of moving with her aunt and would like medication sent to her aunt's house.  Updated RX address in [facility] for this delivery.       Lisa Andrews, DOB: 06/06/1951  Phone: [phone] (home)     All above HIPAA information was verified with patient.     Was a Nurse, learning disability used for this call? No    Specialty Medication(s):   Inflammatory Disorders: Dupixent     Current Outpatient Medications   Medication Sig Dispense Refill   ??? albuterol (VENTOLIN HFA) 90 mcg/actuation inhaler Inhale 2 puffs every four (4) hours as needed.      ??? albuterol 2.5 mg /3 mL (0.083 %) nebulizer solution Inhale 2.5 mg by nebulization every four (4) hours as needed for wheezing.     ??? buPROPion (WELLBUTRIN XL) 300 MG 24 hr tablet Take 300 mg by mouth every morning.      ??? busPIRone (BUSPAR) 5 MG tablet Take 5 mg by mouth two (2) times a day.     ??? cetirizine (ZYRTEC) 10 MG tablet Take 1 tablet by mouth daily.     ??? cholecalciferol, vitamin D3, 1,000 unit capsule Take 2,000 Units by mouth daily.      ??? cimetidine (TAGAMET) 400 MG tablet Take 400 mg by mouth daily.     ??? clobetasoL (TEMOVATE) 0.05 % cream Apply twice a day to affected areas of body.  Avoid face and folds 60 g 6   ??? cyanocobalamin, vitamin B-12, (VITAMIN B-12 ORAL) Take by mouth daily.     ??? dexlansoprazole 60 mg capsule Take 60 mg by mouth daily. 90 capsule 3   ??? dicyclomine (BENTYL) 20 mg tablet Take 20 mg by mouth Every six (6) hours. ONLY TAKING 2 A DAY 20 tablet 1   ??? dupilumab 300 mg/2 mL PnIj Inject the contents of one pen (300 mg) under the skin every two weeks as maintenance. 4 mL 11   ??? empty container Misc Use as directed to dispose of Dupixent pens. 1 each 2   ??? fluticasone furoate-vilanterol (BREO ELLIPTA) 100-25 mcg/dose inhaler Inhale 1 puff daily.     ??? fluticasone propionate (FLONASE) 50 mcg/actuation nasal spray 2 sprays into each nostril daily. 16 g 12   ??? furosemide (LASIX) 20 MG tablet Take 0.5 tablets (10 mg total) by mouth in the morning for 10 days. 5 tablet 0   ??? gabapentin (NEURONTIN) 300 MG capsule Take 1 capsule by mouth twice daily and take 2 capsules every night at bedtime. 360 capsule 3   ??? hydroCHLOROthiazide (HYDRODIURIL) 12.5 MG tablet Take 12.5 mg by mouth daily.     ??? levalbuterol (XOPENEX) 0.63 mg/3 mL nebulizer solution Inhale 1 ampule by nebulization every four (4) hours as needed for wheezing.     ??? linaclotide (LINZESS) 145 mcg capsule Take 145 mcg by mouth.     ??? losartan (COZAAR) 100 MG tablet  TAKE 1 TABLET BY MOUTH ONCE DAILY 90 tablet 1   ??? meclizine (ANTIVERT) 12.5 mg tablet Take 12.5 mg by mouth Three (3) times a day as needed.     ??? meTHIMazole (TAPAZOLE) 5 MG tablet Take 2.5 mg by mouth daily.      ??? ondansetron (ZOFRAN-ODT) 4 MG disintegrating tablet Take 4 mg by mouth every eight (8) hours as needed.      ??? polyethylene glycol (GLYCOLAX) 17 gram/dose powder 17 gm (one capful) with 8 ounces of liquid 510 g 12   ??? predniSONE (DELTASONE) 10 MG tablet Take by mouth 4 tabs/day x5d, then 3 tabs/day x5d, then 2 tabs/day x5d, then 1 tab/day x5d, then 1/2 tab/day x5d 63 tablet 0   ??? rosuvastatin (CRESTOR) 10 MG tablet Take 1 tablet (10 mg total) by mouth nightly. 90 tablet 3   ??? rosuvastatin (CRESTOR) 10 MG tablet Take 10 mg by mouth.     ??? sucralfate (CARAFATE) 1 gram tablet Take 1 g by mouth Four (4) times a day.     ??? tiotropium (SPIRIVA WITH HANDIHALER) 18 mcg inhalation capsule PLACE 1 CAPSULE IN INHALER AND INHALE ONCE DAILY     ??? traMADoL (ULTRAM) 50 mg tablet      ??? triamcinolone (KENALOG) 0.1 % cream APPLY TO AFFECTED AREA ON BACK TWICE DAILY FOR 2 WEEKS OR UNTIL RESOLVED 454 g 2     No current facility-administered medications for this visit.        Changes to medications: Lisa Andrews reports no changes at this time.    Allergies   Allergen Reactions   ??? Nsaids (Non-Steroidal Anti-Inflammatory Drug)      Pt with severe GERD/pud, med interaction   ??? Promethazine Hcl      Medication Interaction   ??? Trimethoprim      rash   ??? Aspirin Nausea And Vomiting     PUD and nausea with aspirin and NSAIDS   ??? Lipitor [Atorvastatin] Muscle Pain   ??? Sulfa (Sulfonamide Antibiotics) Rash   ??? Sulfamethoxazole Rash       Changes to allergies: No    SPECIALTY MEDICATION ADHERENCE            Specialty medication(s) dose(s) confirmed: Regimen is correct and unchanged.     Are there any concerns with adherence? No    Adherence counseling provided? Not needed    CLINICAL MANAGEMENT AND INTERVENTION      Clinical Benefit Assessment:    Do you feel the medicine is effective or helping your condition? Patient has not started medication yet    Clinical Benefit counseling provided? patient has not started yet    Adverse Effects Assessment:    Are you experiencing any side effects? No    Are you experiencing difficulty administering your medicine? No    Quality of Life Assessment:    How many days over the past month did your eczema  keep you from your normal activities? For example, brushing your teeth or getting up in the morning. Patient declined to answer    Have you discussed this with your provider? Not needed    Acute Infection Status:    Acute infections noted within Epic:  No active infections  Patient reported infection: None    Therapy Appropriateness:    Is therapy appropriate? Yes, therapy is appropriate and should be continued    DISEASE/MEDICATION-SPECIFIC INFORMATION      For patients on injectable medications: Patient currently has 0 doses left.  Next  injection is scheduled for will take as soon as she can get in touch with nurse.    PATIENT SPECIFIC NEEDS     - Does the patient have any physical, cognitive, or cultural barriers? No    - Is the patient high risk? No    - Does the patient require a Care Management Plan? No     - Does the patient require physician intervention or other additional services (i.e. nutrition, smoking cessation, social work)? No      SHIPPING     Specialty Medication(s) to be Shipped:   Inflammatory Disorders: Dupixent    Other medication(s) to be shipped: No additional medications requested for fill at this time     Changes to insurance: No    Delivery Scheduled: Yes, Expected medication delivery date: 5/31.  However, Rx request for refills was sent to the provider as there are none remaining.     Medication will be delivered via Same Day Courier to the confirmed prescription address in [facility].    The patient will receive a drug information handout for each medication shipped and additional FDA Medication Guides as required.  Verified that patient has previously received a Conservation officer, historic buildings and a Surveyor, mining.    The patient or caregiver noted above participated in the development of this care plan and knows that they can request review of or adjustments to the care plan at any time.      All of the patient's questions and concerns have been addressed.    Lisa Andrews   [facility] Specialty Pharmacist

## 2020-11-07 NOTE — Unmapped (Signed)
Hi Dr. Marvis Moeller,  I called the patient to cancel her appt that was scheduled with you next week (11/13/20) because of change in your schedule. She wanted me to let you know that she finally received her Dupixent Injections in the mail today, but she is upset because the process has been so confusing. She states that she talked with someone today from [facility] to see when they can send a nurse out to give her the first injection and they told her that the provider has to sign a form to give them permission to give her the shot. Please contact patient to let her know when the nurse will be able to come out to give her the shot. Please contact [facility] to sign the form needed.  Thank you,  Jamesetta So

## 2020-11-12 LAB — CBC W/ AUTO DIFF
BASOPHILS ABSOLUTE COUNT: 0.1 10*9/L (ref 0.0–0.1)
BASOPHILS RELATIVE PERCENT: 0.9 %
EOSINOPHILS ABSOLUTE COUNT: 0 10*9/L (ref 0.0–0.5)
EOSINOPHILS RELATIVE PERCENT: 0.2 %
HEMATOCRIT: 40.6 % (ref 34.0–44.0)
HEMOGLOBIN: 13.4 g/dL (ref 11.3–14.9)
LYMPHOCYTES ABSOLUTE COUNT: 1.6 10*9/L (ref 1.1–3.6)
LYMPHOCYTES RELATIVE PERCENT: 13.3 %
MEAN CORPUSCULAR HEMOGLOBIN CONC: 33 g/dL (ref 32.0–36.0)
MEAN CORPUSCULAR HEMOGLOBIN: 28.6 pg (ref 25.9–32.4)
MEAN CORPUSCULAR VOLUME: 86.7 fL (ref 77.6–95.7)
MEAN PLATELET VOLUME: 8.2 fL (ref 6.8–10.7)
MONOCYTES ABSOLUTE COUNT: 0.8 10*9/L (ref 0.3–0.8)
MONOCYTES RELATIVE PERCENT: 6.3 %
NEUTROPHILS ABSOLUTE COUNT: 9.7 10*9/L — ABNORMAL HIGH (ref 1.8–7.8)
NEUTROPHILS RELATIVE PERCENT: 79.3 %
PLATELET COUNT: 217 10*9/L (ref 150–450)
RED BLOOD CELL COUNT: 4.69 10*12/L (ref 3.95–5.13)
RED CELL DISTRIBUTION WIDTH: 14.9 % (ref 12.2–15.2)
WBC ADJUSTED: 12.3 10*9/L — ABNORMAL HIGH (ref 3.6–11.2)

## 2020-11-12 LAB — COMPREHENSIVE METABOLIC PANEL
ALBUMIN: 3.6 g/dL (ref 3.4–5.0)
ALKALINE PHOSPHATASE: 100 U/L (ref 46–116)
ALT (SGPT): 17 U/L (ref 10–49)
ANION GAP: 1 mmol/L — ABNORMAL LOW (ref 5–14)
AST (SGOT): 18 U/L (ref <=34)
BILIRUBIN TOTAL: 0.3 mg/dL (ref 0.3–1.2)
BLOOD UREA NITROGEN: 11 mg/dL (ref 9–23)
BUN / CREAT RATIO: 10
CALCIUM: 9.9 mg/dL (ref 8.7–10.4)
CHLORIDE: 106 mmol/L (ref 98–107)
CO2: 31 mmol/L (ref 20.0–31.0)
CREATININE: 1.08 mg/dL — ABNORMAL HIGH
EGFR CKD-EPI (2021) FEMALE: 55 mL/min/{1.73_m2} — ABNORMAL LOW (ref >=60)
GLUCOSE RANDOM: 119 mg/dL (ref 70–179)
POTASSIUM: 4.4 mmol/L (ref 3.4–4.8)
PROTEIN TOTAL: 6.8 g/dL (ref 5.7–8.2)
SODIUM: 138 mmol/L (ref 135–145)

## 2020-11-12 LAB — HIGH SENSITIVITY TROPONIN I - SERIAL: HIGH SENSITIVITY TROPONIN I: 7 ng/L (ref <=34)

## 2020-11-12 LAB — B-TYPE NATRIURETIC PEPTIDE: B-TYPE NATRIURETIC PEPTIDE: 22 pg/mL (ref <=100)

## 2020-11-12 LAB — TSH: THYROID STIMULATING HORMONE: 0.69 u[IU]/mL (ref 0.550–4.780)

## 2020-11-12 LAB — MAGNESIUM: MAGNESIUM: 2.1 mg/dL (ref 1.6–2.6)

## 2020-11-13 ENCOUNTER — Ambulatory Visit: Admit: 2020-11-13 | Discharge: 2020-11-15 | Disposition: A | Discharge status: Home Health | Payer: MEDICARE

## 2020-11-13 LAB — HIGH SENSITIVITY TROPONIN I - 6 HOUR SERIAL
HIGH SENSITIVITY TROPONIN - DELTA (2-6H): 1 ng/L (ref <=7)
HIGH-SENSITIVITY TROPONIN I - 6 HOUR: 5 ng/L (ref <=34)

## 2020-11-13 LAB — BLOOD GAS, VENOUS
BASE EXCESS VENOUS: 11.1 — ABNORMAL HIGH (ref -2.0–2.0)
HCO3 VENOUS: 31 mmol/L — ABNORMAL HIGH (ref 22–27)
O2 SATURATION VENOUS: 22.4 % — ABNORMAL LOW (ref 40.0–85.0)
PCO2 VENOUS: 59 mmHg (ref 40–60)
PH VENOUS: 7.4 (ref 7.32–7.43)
PO2 VENOUS: <20 mmHg — ABNORMAL LOW (ref 30–55)

## 2020-11-13 LAB — HIGH SENSITIVITY TROPONIN I - 2 HOUR SERIAL
HIGH SENSITIVITY TROPONIN - DELTA (0-2H): 1 ng/L (ref <=7)
HIGH-SENSITIVITY TROPONIN I - 2 HOUR: 6 ng/L (ref <=34)

## 2020-11-13 MED ADMIN — furosemide (LASIX) injection 20 mg: 20 mg | INTRAVENOUS | @ 05:00:00 | Stop: 2020-11-13

## 2020-11-13 MED ADMIN — lidocaine (LIDODERM) 5 % patch 1 patch: 1 | TRANSDERMAL | @ 22:00:00

## 2020-11-13 MED ADMIN — sucralfate (CARAFATE) tablet 1 g: 1 g | ORAL | @ 22:00:00

## 2020-11-13 MED ADMIN — hydroCHLOROthiazide (HYDRODIURIL) tablet 12.5 mg: 12.5 mg | ORAL | @ 18:00:00

## 2020-11-13 MED ADMIN — methIMAzole (TAPAZOLE) split tablet 2.5 mg: 2.5 mg | ORAL | @ 18:00:00

## 2020-11-13 MED ADMIN — enoxaparin (LOVENOX) syringe 40 mg: 40 mg | SUBCUTANEOUS | @ 22:00:00

## 2020-11-13 MED ADMIN — losartan (COZAAR) tablet 100 mg: 100 mg | ORAL | @ 18:00:00

## 2020-11-13 MED ADMIN — azithromycin (ZITHROMAX) tablet 500 mg: 500 mg | ORAL | @ 18:00:00 | Stop: 2020-11-13

## 2020-11-13 MED ADMIN — furosemide (LASIX) injection 20 mg: 20 mg | INTRAVENOUS | @ 22:00:00 | Stop: 2020-11-13

## 2020-11-13 MED ADMIN — nicotine (NICODERM CQ) 21 mg/24 hr patch 1 patch: 1 | TRANSDERMAL | @ 22:00:00

## 2020-11-13 MED ADMIN — famotidine (PEPCID) tablet 20 mg: 20 mg | ORAL | @ 18:00:00

## 2020-11-13 MED ADMIN — predniSONE (DELTASONE) tablet 60 mg: 60 mg | ORAL | @ 05:00:00 | Stop: 2020-11-13

## 2020-11-13 MED ADMIN — ipratropium-albuteroL (DUO-NEB) 0.5-2.5 mg/3 mL nebulizer solution 3 mL: 3 mL | RESPIRATORY_TRACT | @ 19:00:00

## 2020-11-13 MED ADMIN — buPROPion (WELLBUTRIN XL) 24 hr tablet 300 mg: 300 mg | ORAL | @ 18:00:00

## 2020-11-13 MED ADMIN — predniSONE (DELTASONE) tablet 40 mg: 40 mg | ORAL | @ 18:00:00 | Stop: 2020-11-17

## 2020-11-13 NOTE — Unmapped (Signed)
Patient BIB [facility] from home. PT found w/ O2 of 86% on 2 L. Patient placed on a non-rebreather. O2 is 94% on RA in triage. PT used 2 L of O2 at baseline.

## 2020-11-13 NOTE — Unmapped (Signed)
Inpatient Tobacco Cessation Counseling Note    This medical encounter was conducted virtually using [email or web address]  TeleHealth protocols.    I have identified myself to the patient and conveyed my credentials to Lisa Andrews  I have explained the capabilities and limitations of telemedicine and the patient/proxy and myself both agree that it is appropriate for their current circumstances/symptoms.     Contact Information  Person Contacted: Lisa Andrews number: [phone]      Phone Outcome: Spoke with pt.  Is there someone else in the room? No.     Patient's location at the time of the telephone visit: Hospitalized at [facility]   Provider's location at the time of the telephone visit: At home, in [place]      Purpose of contact:      Pt participated in a telephone visit for tobacco cessation counseling.  Patient was admitted to hospital for Shortness of breath [R06.02]  COPD exacerbation (CMS-HCC) [J44.1]. Patient consented to telephone visit given due to social isolation measures in place due to the COVID-19 pandemic.     Tobacco Use History and Assessment  Time Since Last Tobacco Use: 1 to 7 days ago  Tobacco Withdrawal (Past 24 Hours): None noted  Type of Tobacco Products Used: Cigarettes  Quantity Used: 10  Quantity Per: day  Other Household Members Use Tobacco: Yes  Smoking Allowed in Home: No  Longest Time Without Tobacco: Months  # of Hours, Days, Weeks, Months or Years: 3  Medications Used in Past Attempts: Nicotine Patch, Bupropion  Side Effects: 1. Nicotine patch - skin irritation    Behavioral Assessment  Why Uses: 1. Habit 2. Family members smoke  Reasons to Become Tobacco Free: 1. Health  Barriers/Challenges: 1. Habit 2. Pt's family members who live with her smoke and are not interested in quitting 3. Pt unable to chew gum  Strategies: 1. NRT 2. Pt to have family members smoke farther away from home 3. Pt to remove herself when family members are smoking    NOTE: Counselor assessed pt's current tobacco use and history of use, and used MI to elicit motivation. Counselor provided evidence-based treatment for tobacco use, including discussing triggers, behavioral strategies, and tobacco cessation medications. Pt cited her health as motivation to quit. Counselor explained the importance of using cessation medication to prevent withdrawal and increase long-term quit success. Pt elected to use the nicotine patch + lozenge. Pt shared that her main trigger is being around others that smoke. Pt explained that she currently lives with others who smoke and who are not interested in quitting. Counselor and pt discussed behavioral strategies pt could adopt to help mitigate her urges and reduce her tobacco use (listed above). Counselor provided pt with contact information, physical improvements related to tobacco cessation, and available resources (including outpatient Tobacco Treatment Program at [facility] and [place] [facility]).    Treatment Plan  Please see below for medication recommendations in bold.   Cessation Meds Currently Using: None  Medications Recommended During Hospitalization: Patch 21mg , Lozenge 4mg   Outpatient/Discharge Medications Recommended: Patch 21mg , Lozenge 4mg   Plan to Obtain Outpatient Meds: TTS messaged providers for Rx  Patient's Plan Post Discharge/Visit: Plan to quit as soon as possible    As part of this Telephone Visit, no in-person exam was conducted.     I personally spent 12 minutes counseling the patient via telephone about tobacco cessation.  I spent an additional 8  minutes on pre- and post-visit activities.      The patient was physically located in [place] or a state in which I am permitted to provide care. The patient and/or parent/guardian understood that s/he may incur co-pays and cost sharing, and agreed to the telemedicine visit. The visit was reasonable and appropriate under the circumstances given the patient's presentation at the time.     The patient and/or parent/guardian has been advised of the potential risks and limitations of this mode of treatment (including, but not limited to, the absence of in-person examination) and has agreed to be treated using telemedicine. The patient's/patient's family's questions regarding telemedicine have been answered.      If the visit was completed in an ambulatory setting, the patient and/or parent/guardian has also been advised to contact their provider???s office for worsening conditions, and seek emergency medical treatment and/or call 911 if the patient deems either necessary.     Visit Format/Coding: Telephone     Coding: 16109 (11-20 minutes)  Service rendered over the phone most consistent with: Tobacco cessation counseling, greater than 10 minutes (60454)     Doristine Devoid, BS, NCTTP  Tobacco Treatment Counselor  [facility]  [phone]

## 2020-11-13 NOTE — Unmapped (Signed)
Bed: 72-D  Expected date: 11/12/20  Expected time: 10:02 PM  Means of arrival:   Comments:  Ems resp distress 74% hx of o2 sat 95

## 2020-11-13 NOTE — Unmapped (Signed)
[facility]  Emergency Department Provider Note    ED Clinical Impression     Final diagnoses:   None       Initial Impression, ED Course, Assessment and Plan     Impression: This is a 70 year old with a history of borderline pulmonary hypertension, COPD on 2 L home oxygen, and hypertension who presents with hypoxia from home.  She called EMS when she was feeling short of breath.  On 3 L at home she was found to be 86%.  She was placed on a nonrebreather with improvement to 94%.  She was given breathing treatments in route.  No fevers, nausea vomiting, productive cough.  She does endorse orthopnea.  She was recently started on Lasix 10 mg but stopped on 6/2 when she ran out.    On physical exam she is saturating at 89-90% on 3 L, she has 1+ pitting edema to bilateral lower extremities with diminished breath sounds and crackles throughout.    Overall I am concerned for mixed picture for CHF/pulmonary hypertension with COPD exacerbation.  Will give steroids, Lasix, obtain labs, and she will likely require admission.    Chest x-ray significant for mild pulmonary edema.    ED Course as of 11/13/20 0922   Sun Nov 12, 2020   2315 XR Chest Portable  IMPRESSION:     Mild pulmonary edema. No focal consolidation.   Mon Nov 13, 2020   0046 hsTroponin I: 7   0046 BNP: 29   0049 Paged mao         Additional Medical Decision Making     I have reviewed the vital signs and the nursing notes. Labs and radiology results that were available during my care of the patient were independently reviewed by me and considered in my medical decision making.     I staffed the case with the ED attending, Dr. Scot Jun.    Portions of this record have been created using Scientist, clinical (histocompatibility and immunogenetics). Dictation errors have been sought, but may not have been identified and corrected.  ____________________________________________       History     Chief Complaint  Respiratory Distress (Patient BIB [facility] from home. PT found w/ O2 of 86% on 2 L. Patient placed on a non-rebreather. O2 is 94% on RA in triage. PT used 2 L of O2 at baseline. )      HPI   CECI Lisa Andrews is a 70 y.o. female with a history of borderline pulmonary hypertension, COPD on 2 L home oxygen, and hypertension who presents with hypoxia from home.  She called EMS when she was feeling short of breath.  On 3 L at home she was found to be 86%.  She was placed on a nonrebreather with improvement to 94%.  She was given breathing treatments in route.  No fevers, nausea vomiting, productive cough.  She does endorse orthopnea.  She was recently started on Lasix 10 mg but stopped on 6/2 when she ran out.       Past Medical History:   Diagnosis Date    Anxiety and depression     Arthritis     Asthma     At high risk for falls 04/03/2018    Bipolar disorder (CMS-HCC)     COPD bronchitis     Eczema     GERD (gastroesophageal reflux disease)     Headache     Hearing impairment     Hypertension     Impaired mobility  Lung disease     Memory deficit 02/27/2018    Seizures (CMS-HCC)     Thyroid disease     hypothyroid in the past, now euthyroid per patient.    Urinary tract infection     resolved       Patient Active Problem List   Diagnosis    Constipation    Irritable colon    Urinary tract infection SEES UROLOGY    Vitamin B12 deficiency    Seizure disorder (CMS-HCC)    Tobacco use    Sleep apnea - does not use CPAP - has 2LO2 at night    Eczema    Allergic rhinitis    Essential hypertension    GERD (gastroesophageal reflux disease)    Bipolar affect, depressed - SEES PSYCHIATRY    Hyperthyroidism    Osteopenia of lumbar spine    Asthma    Foot pain, bilateral    Dermatitis    Disorder of bone and cartilage    Tobacco use disorder    Moderate COPD - sees PULMONOLOGY  (RAF-HCC)    Vitamin B-complex deficiency    Bell's palsy    Arthritis    Headache    COPD with acute exacerbation (CMS-HCC)    Post herpetic neuralgia    Encounter for long-term current use of medication    Splenic artery aneurysm (CMS-HCC)    Family circumstance NOS    Mixed hyperlipidemia    Excessive tearing, right    History of vertigo    Noncompliance    Prediabetes    Phlebitis    Onychomycosis    Osteoarthritis    Memory deficit    COPD, moderate (CMS-HCC)    At high risk for falls    RLS (restless legs syndrome)    Sciatica of right side    Gastritis with intestinal metaplasia of stomach    Adenomatous polyp of colon    Chronic bilateral low back pain with bilateral sciatica    Lumbar stenosis with neurogenic claudication       Past Surgical History:   Procedure Laterality Date    ABDOMINAL HERNIA REPAIR      GALLBLADDER SURGERY      HYSTERECTOMY      TAHBSO    PR UPPER GI ENDOSCOPY,DIAGNOSIS N/A 09/28/2013    Procedure: UGI ENDO, INCLUDE ESOPHAGUS, STOMACH, & DUODENUM &/OR JEJUNUM; DX W/WO COLLECTION SPECIMN, BY BRUSH OR WASH;  Surgeon: Marygrace Drought, MD;  [facility];  Service: Gastroenterology    SKIN BIOPSY      TOTAL ABDOMINAL HYSTERECTOMY W/ BILATERAL SALPINGOOPHORECTOMY         No current facility-administered medications for this encounter.    Current Outpatient Medications:     albuterol (VENTOLIN HFA) 90 mcg/actuation inhaler, Inhale 2 puffs every four (4) hours as needed. , Disp: , Rfl:     albuterol 2.5 mg /3 mL (0.083 %) nebulizer solution, Inhale 2.5 mg by nebulization every four (4) hours as needed for wheezing., Disp: , Rfl:     buPROPion (WELLBUTRIN XL) 300 MG 24 hr tablet, Take 300 mg by mouth every morning. , Disp: , Rfl:     busPIRone (BUSPAR) 5 MG tablet, Take 5 mg by mouth two (2) times a day., Disp: , Rfl:     cetirizine (ZYRTEC) 10 MG tablet, Take 1 tablet by mouth daily., Disp: , Rfl:     cholecalciferol, vitamin D3, 1,000 unit capsule, Take 2,000 Units by mouth daily. , Disp: ,  Rfl:     cimetidine (TAGAMET) 400 MG tablet, Take 400 mg by mouth daily., Disp: , Rfl:     clobetasoL (TEMOVATE) 0.05 % cream, Apply twice a day to affected areas of body.  Avoid face and folds, Disp: 60 g, Rfl: 6    cyanocobalamin, vitamin B-12, (VITAMIN B-12 ORAL), Take by mouth daily., Disp: , Rfl:     dexlansoprazole 60 mg capsule, Take 60 mg by mouth daily., Disp: 90 capsule, Rfl: 3    dicyclomine (BENTYL) 20 mg tablet, Take 20 mg by mouth Every six (6) hours. ONLY TAKING 2 A DAY, Disp: 20 tablet, Rfl: 1    dupilumab (DUPIXENT PEN) 300 mg/2 mL PnIj, Inject the contents of 2 pens (600 mg) under the skin once for loading., Disp: 4 mL, Rfl: 0    dupilumab 300 mg/2 mL PnIj, Inject the contents of one pen (300 mg) under the skin every two weeks as maintenance., Disp: 4 mL, Rfl: 11    empty container Misc, Use as directed to dispose of Dupixent pens., Disp: 1 each, Rfl: 2    fluticasone furoate-vilanterol (BREO ELLIPTA) 100-25 mcg/dose inhaler, Inhale 1 puff daily., Disp: , Rfl:     fluticasone propionate (FLONASE) 50 mcg/actuation nasal spray, 2 sprays into each nostril daily., Disp: 16 g, Rfl: 12    furosemide (LASIX) 20 MG tablet, Take 0.5 tablets (10 mg total) by mouth in the morning for 10 days., Disp: 5 tablet, Rfl: 0    gabapentin (NEURONTIN) 300 MG capsule, Take 1 capsule by mouth twice daily and take 2 capsules every night at bedtime., Disp: 360 capsule, Rfl: 3    hydroCHLOROthiazide (HYDRODIURIL) 12.5 MG tablet, Take 12.5 mg by mouth daily., Disp: , Rfl:     levalbuterol (XOPENEX) 0.63 mg/3 mL nebulizer solution, Inhale 1 ampule by nebulization every four (4) hours as needed for wheezing., Disp: , Rfl:     linaclotide (LINZESS) 145 mcg capsule, Take 145 mcg by mouth., Disp: , Rfl:     losartan (COZAAR) 100 MG tablet, TAKE 1 TABLET BY MOUTH ONCE DAILY, Disp: 90 tablet, Rfl: 1    meclizine (ANTIVERT) 12.5 mg tablet, Take 12.5 mg by mouth Three (3) times a day as needed., Disp: , Rfl:     meTHIMazole (TAPAZOLE) 5 MG tablet, Take 2.5 mg by mouth daily. , Disp: , Rfl:     ondansetron (ZOFRAN-ODT) 4 MG disintegrating tablet, Take 4 mg by mouth every eight (8) hours as needed. , Disp: , Rfl:     polyethylene glycol (GLYCOLAX) 17 gram/dose powder, 17 gm (one capful) with 8 ounces of liquid, Disp: 510 g, Rfl: 12    predniSONE (DELTASONE) 10 MG tablet, Take by mouth 4 tabs/day x5d, then 3 tabs/day x5d, then 2 tabs/day x5d, then 1 tab/day x5d, then 1/2 tab/day x5d, Disp: 63 tablet, Rfl: 0    rosuvastatin (CRESTOR) 10 MG tablet, Take 1 tablet (10 mg total) by mouth nightly., Disp: 90 tablet, Rfl: 3    rosuvastatin (CRESTOR) 10 MG tablet, Take 10 mg by mouth., Disp: , Rfl:     sucralfate (CARAFATE) 1 gram tablet, Take 1 g by mouth Four (4) times a day., Disp: , Rfl:     tiotropium (SPIRIVA WITH HANDIHALER) 18 mcg inhalation capsule, PLACE 1 CAPSULE IN INHALER AND INHALE ONCE DAILY, Disp: , Rfl:     traMADoL (ULTRAM) 50 mg tablet, , Disp: , Rfl:     triamcinolone (KENALOG) 0.1 % cream, APPLY TO AFFECTED AREA ON BACK  TWICE DAILY FOR 2 WEEKS OR UNTIL RESOLVED, Disp: 454 g, Rfl: 2    Allergies  Nsaids (non-steroidal anti-inflammatory drug), Promethazine hcl, Trimethoprim, Aspirin, Lipitor [atorvastatin], Sulfa (sulfonamide antibiotics), and Sulfamethoxazole    Family History   Problem Relation Age of Onset    Arthritis Mother     Dementia Mother     Throat cancer Brother     Diabetes Brother     Cancer Maternal Uncle         Colon    Glaucoma Maternal Grandmother     Mental illness Daughter     Substance Abuse Disorder Daughter     Substance Abuse Disorder Son     Colorectal Cancer Neg Hx     Inflammatory bowel disease Neg Hx     Melanoma Neg Hx     Basal cell carcinoma Neg Hx     Squamous cell carcinoma Neg Hx        Social History  Social History     Tobacco Use    Smoking status: Heavy Tobacco Smoker     Packs/day: 1.00     Types: Cigarettes    Smokeless tobacco: Never Used   Vaping Use    Vaping Use: Former   Substance Use Topics    Alcohol use: No     Alcohol/week: 0.0 standard drinks     Comment: quit 20 years ago, was heavy off and on    Drug use: No       Review of Systems   Constitutional: Negative for fever.  Eyes: Negative for visual changes.  ENT: Negative for sore throat.  Cardiovascular: Negative for chest pain.  Respiratory: Positive for shortness of breath.  Gastrointestinal: Negative for abdominal pain, vomiting or diarrhea.  Genitourinary: Negative for dysuria.   Musculoskeletal: Negative for back pain.  Skin: Negative for rash.  Neurological: Negative for headaches, focal weakness or numbness.    Physical Exam     ED Triage Vitals [11/12/20 2223]   Enc Vitals Group      BP 128/70      Heart Rate 106      SpO2 Pulse 106      Resp 20      Temp 37.3 ??C (99.1 ??F)      Temp Source Oral      SpO2 95 %      Weight       Height       Head Circumference       Peak Flow       Pain Score       Pain Loc       Pain Edu?       Excl. in GC?          Constitutional: Alert and oriented.  In mild aspiratory distress  Eyes: Conjunctivae are normal.  ENT       Head: Normocephalic and atraumatic.       Nose: No congestion.       Mouth/Throat: Mucous membranes are moist.       Neck: No stridor.  Hematological/Lymphatic/Immunilogical: No masses  Cardiovascular: Normal rate, regular rhythm.   Respiratory: Coarse and diminished breath sounds bilaterally without any focal wheezing  Gastrointestinal: Soft and nontender.   Genitourinary: Deferred  Musculoskeletal: Normal range of motion in all extremities.  1+ edema to bilateral lower extremities  Neurologic: Normal speech and language. No gross focal neurologic deficits are appreciated.  Skin: Skin is warm, dry and intact. No rash noted.  Psychiatric:  Mood and affect are normal. Speech and behavior are normal.      Radiology     XR Chest Portable   Final Result      No acute findings.             Sanjuana Kava., MD  Resident  11/13/20 [phone]

## 2020-11-13 NOTE — Unmapped (Signed)
Problem: Adult Inpatient Plan of Care  Goal: Plan of Care Review  Outcome: Progressing  Goal: Patient-Specific Goal (Individualized)  Outcome: Progressing  Goal: Absence of Hospital-Acquired Illness or Injury  Outcome: Progressing  Goal: Optimal Comfort and Wellbeing  Outcome: Progressing  Goal: Readiness for Transition of Care  Outcome: Progressing  Goal: Rounds/Family Conference  Outcome: Progressing     Problem: Self-Care Deficit  Goal: Improved Ability to Complete Activities of Daily Living  Outcome: Progressing     Problem: Fall Injury Risk  Goal: Absence of Fall and Fall-Related Injury  Outcome: Progressing     Problem: Asthma Comorbidity  Goal: Maintenance of Asthma Control  Outcome: Progressing     Problem: COPD (Chronic Obstructive Pulmonary Disease) Comorbidity  Goal: Maintenance of COPD Symptom Control  Outcome: Progressing     Problem: Diabetes Comorbidity  Goal: Blood Glucose Level Within Targeted Range  Outcome: Progressing     Problem: Hypertension Comorbidity  Goal: Blood Pressure in Desired Range  Outcome: Progressing     Problem: Adjustment to Illness (Heart Failure)  Goal: Optimal Coping  Outcome: Progressing     Problem: Cardiac Output Decreased (Heart Failure)  Goal: Optimal Cardiac Output  Outcome: Progressing     Problem: Dysrhythmia (Heart Failure)  Goal: Stable Heart Rate and Rhythm  Outcome: Progressing     Problem: Fluid Imbalance (Heart Failure)  Goal: Fluid Balance  Outcome: Progressing     Problem: Functional Ability Impaired (Heart Failure)  Goal: Optimal Functional Ability  Outcome: Progressing     Problem: Oral Intake Inadequate (Heart Failure)  Goal: Optimal Nutrition Intake  Outcome: Progressing     Problem: Respiratory Compromise (Heart Failure)  Goal: Effective Oxygenation and Ventilation  Outcome: Progressing     Problem: Sleep Disordered Breathing (Heart Failure)  Goal: Effective Breathing Pattern During Sleep  Outcome: Progressing

## 2020-11-13 NOTE — Unmapped (Signed)
Family Medicine Inpatient Service  History and Physical Note    Team: [facility] [place] (pgr [phone])    PCP: Othelia Pulling, MD  Date of Admission: November 13, 2020  Code Status: full code  Emergency Contact: Claudette Head, brother, home [phone] or cell [phone]    ASSESSMENT / PLAN:   Lisa Andrews is a 70 y.o. 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     # SoB - Acute Hypoxic Respiratory Failure:  Patient with hx of COPD and asthma on baseline 2 L O2 at home, presented to the ED on 3 L satting 86%.  On intial exam in the ED, exam notable for 1+ pitting edema to bilateral lower extremities with diminished breath sounds and crackles throughout. Received 1x 20 mg IV lasix with improvement in LE Edema but continued increased O2 requirement. CXR w/o acute opacity, bedside POCUS w/ diffuse B-lines. DDx includes most likely COPD exacerbation vs progression of COPD, other pulmonary pathology including idiopathic pulmonary fibrosis, worsening pulmonary HTN or new onset CHF (particularly given new LE edema). However, BNP in ED quite low and last last echo completed Feb 2022 showed normal LVEF and diastolic fxn. PNA unlikely as she does not have consolidation on CXR and is afebrile without leukocytosis. COVID/RSV/flu negative. VBG unremarkable.EKG w/o c/f structural heart disease, ST/T wave changes c/f ischemia or infarct, and troponin neg x 3, so no concerns for ACS at this time.   - For suspected COPD exacerbation:    - S/p prednisone 60 mg x 1 in ED. Continue prednisone 40x4d.    - Will not begin abx per guidelines as no increased sputum.    - Scheduled duo-nebs   - Hold home inhalers during acute exacerbation   - For possible CHF exacerbation, worsening pulm HTN:    - Repeat limited echo   - S/p 1x IV lasix 20 mg in ED with improvement in LE edema. Continue diuresis given B-lines visualized on POCUS.   - Chest CT to evaluate for fibrosis, nodules, evidence of chronic infection.  other pulmonary causes.  - Supplemental O2 as needed, goal SpO2 88-92%  - Continuous pulse ox  - Daily CBC and BMP  - Plan for goals of care discussion, possible palliative care involvement given likely severe to end-stage COPD  - Will likely need repeat PFTs outpatient.    #Muscle spasms: patient endorses muscle spasms in toes, feet, ribcage muscles, and diaphragms daily for at least a few months. Does not feel it correlates with starting rosuvastatin in March 2022, though previously had similar cramps with atorvastatin. Electrolytes and TSH wnl.   - Trial robaxin for pain relief   - Lidocaine patch to chest wall   - Consider d/c rosuvastatin to see if sx improve      #HTN:  Well controlled.  Continue home regimen hydrochlorothiazide 12.5 mg daily, losartan 100 mg daily,  #HLD: Home regimen rosuvastatin 10mg  nightly. Formulary substitution atorvastatin 20 mg daily.   #Hyperthyroidism: Follows with endocrinology (last seen 10/16/20). Continue methimazole 2.5mg  daily.  #Anxiety/Depression:  Continue home buproprion 300mg  daily.  #IBS:  Continue home dicyclomine 20 mg daily, linzess 145mg  daily, miralax daily prn  #GERD: Home regimen cimetidine 400mg  daily and dexlansoprazole 60mg  daily.  Formulary substitution famotidine 20 mg daily and pantoprazole 40 mg daily, sucralfate  1g QID  #Chronic pain:  Continue home gabapentin 600 mg at bedtime     # Tobacco use disorder: Patient currently smoking <1 PPD. Has 50+ pack year smoking history. Will place inpatient consult to tobacco cessation for toxic effects of tobacco with COPD and possible nicotine withdrawal.  - Tobacco cessation consult  - Nicotine replacement therapy with patch, gum, lozenge PRN    # FEN/GI:  - IVF None  - Check electrolytes as indicated, replete as needed.  - Diet Heart Healthy    # PPX:   - DVT: SQ Lovenox    # Dispo: Floor  [ ]  Anticipated Discharge Location: Home  [ ]  PT/OT/DME: No needs anticipated  [ ]  CM/SW needs: None anticipated  [ ]  Meds/Rx:  Not yet prescribed. No special med needs  [ ]  Teaching: None anticipated  [ ]  Follow up appt: Appointment needed  [ ]  Excuse letter: None anticipated  [ ]  Transport: Private Needed    This patient warrants inpatient care because of their severity of illness and the intensity of service that can only be performed in a hospital setting. This patient is expected to be hospitalized for greater than two midnights in the hospital because of the severity of their illness with hypoxia with increased O2 requirement.     HISTORY OF PRESENT ILLNESS:  Lisa Andrews is a 70 y.o. 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  She has severe COPD at baseline on 2L at home, but it has been worse for the last week, accompanied by painful LE swelling. She can normally get from her porch to her car, but now cannot even get across the room without getting SOB. She has also had non-specific, non-radiating dull chest pain worse with exertion but this is not new and has been present for the last 99mo. ROS otherwise negative, no HA, lightheadedness, LOC, ABD pain, changes in stool or urine.     Per chart review, patient presented to her pulmonologist on 10/10/20 for a COPD exacerbation. She was prescribed a steroid taper which she completed.  She then presented to the ED on 5/23 due to an increase in home O2 from 2 L to 3L.  CXR showed some pulmonary edema but overall appeared unchanged from prior.  She was discharged from the ED with instructions to hold hydrochlorothiazide and start a short course of lasix.  Also restarted her steroids at that time.  She was offered admission at that time but she declined.     Her main complaint currently is that it is 3pm and she has not had any of her GI medications yet, so she is feeling bloated.     ED Course: She had self-increased O2 to 3 L and EMS found her with O2 sats 86%.  She was placed on a nonrebreather with improvement to 94%.  She was given breathing treatments in route. No fevers, nausea vomiting, productive cough.  She does endorse orthopnea. On physical exam she is saturating at 89-90% on 3 L, she has 1+ pitting edema to bilateral lower extremities with diminished breath sounds and crackles throughout.  Chest x-ray significant for mild pulmonary edema.  She received prednisone 60 and IV lasix 20 in the ED.      COVID-19 Universal Testing on Admission (if asymptomatic, does not need repeat if done with the past 7 days): Asymptomatic & Negative    COVID-19 Vaccine:  Already completed    PAST MEDICAL / SURGICAL HX:  Past Medical History:   Diagnosis Date   ??? Anxiety and depression    ??? Arthritis    ??? Asthma    ??? At high risk for falls 04/03/2018   ??? Bipolar disorder (CMS-HCC)    ??? COPD bronchitis    ??? Eczema    ??? GERD (gastroesophageal reflux disease)    ??? Headache    ??? Hearing impairment    ??? Hypertension    ??? Impaired mobility    ??? Lung disease    ??? Memory deficit 02/27/2018   ??? Seizures (CMS-HCC)    ??? Thyroid disease     hypothyroid in the past, now euthyroid per patient.   ??? Urinary tract infection     resolved     Past Surgical History:   Procedure Laterality Date   ??? ABDOMINAL HERNIA REPAIR     ??? GALLBLADDER SURGERY     ??? HYSTERECTOMY      TAHBSO   ??? PR UPPER GI ENDOSCOPY,DIAGNOSIS N/A 09/28/2013    Procedure: UGI ENDO, INCLUDE ESOPHAGUS, STOMACH, & DUODENUM &/OR JEJUNUM; DX W/WO COLLECTION SPECIMN, BY BRUSH OR WASH;  Surgeon: Marygrace Drought, MD;  [facility];  Service: Gastroenterology   ??? SKIN BIOPSY     ??? TOTAL ABDOMINAL HYSTERECTOMY W/ BILATERAL SALPINGOOPHORECTOMY         FAMILY HX:   Family History   Problem Relation Age of Onset   ??? Arthritis Mother    ??? Dementia Mother    ??? Throat cancer Brother    ??? Diabetes Brother ??? Cancer Maternal Uncle         Colon   ??? Glaucoma Maternal Grandmother    ??? Mental illness Daughter    ??? Substance Abuse Disorder Daughter    ??? Substance Abuse Disorder Son    ??? Colorectal Cancer Neg Hx    ??? Inflammatory bowel disease Neg Hx    ??? Melanoma Neg Hx    ??? Basal cell carcinoma Neg Hx    ??? Squamous cell carcinoma Neg Hx        SOCIAL HX:   Social History     Socioeconomic History   ??? Marital status: Legally Separated     Spouse name: None   ??? Number of children: None   ??? Years of education: None   ??? Highest education level: None   Tobacco Use   ??? Smoking status: Heavy Tobacco Smoker     Packs/day: 1.00     Types: Cigarettes   ??? Smokeless tobacco: Never Used   Vaping Use   ??? Vaping Use: Former   Substance and Sexual Activity   ??? Alcohol use: No     Alcohol/week: 0.0 standard drinks     Comment: quit 20 years ago, was heavy off and on   ??? Drug use: No   Other Topics Concern   ??? Do you use sunscreen? No   ??? Tanning bed use? No   ??? Are you easily burned? No   ??? Excessive sun exposure? No   ??? Blistering sunburns? No   Social History Narrative    05-11-15 - Daughter and grandson living with her because they have no where else to go.         01/31/16        PCMH Components:    ??    Family, social, cultural characteristics: daughter .  Patient has the following communication needs: vision issue wears glasses    Health Literacy: How confident are you that you understand your health issues/concerns, can participate in your care, and manage your care along with your physician: confident.    Behaviors Affecting Health: poor dietary choices.     Have you been seen by any medical provider that we have not referred you to since your last visit ? No    Discussed a Living Will with the patient and the : Patient interested in education materials about a living will. Materials provided at today's visit.         12-09-16 Daughter who was doing drugs has now moved out of her home. Her grandson is moving out with his girlfriend soon.      Social Determinants of Health     Financial Resource Strain: Low Risk    ??? Difficulty of Paying Living Expenses: Not very hard   Food Insecurity: No Food Insecurity   ??? Worried About Running Out of Food in the Last Year: Never true   ??? Ran Out of Food in the Last Year: Never true   Transportation Needs: Unmet Transportation Needs   ??? Lack of Transportation (Medical): Yes   ??? Lack of Transportation (Non-Medical): Yes   Stress: Stress Concern Present   ??? Feeling of Stress : Rather much       MEDICATIONS / ALLERGIES:  (Not in a hospital admission)      Allergies   Allergen Reactions   ??? Nsaids (Non-Steroidal Anti-Inflammatory Drug)      Pt with severe GERD/pud, med interaction   ??? Promethazine Hcl      Medication Interaction   ??? Trimethoprim      rash   ??? Aspirin Nausea And Vomiting     PUD and nausea with aspirin and NSAIDS   ??? Lipitor [Atorvastatin] Muscle Pain   ??? Sulfa (Sulfonamide Antibiotics) Rash   ??? Sulfamethoxazole Rash       IMMUNIZATIONS:   Immunization History   Administered Date(s) Administered   ??? COVID-19 VACCINE,MRNA(MODERNA)(PF)(IM) 08/25/2019, 09/22/2019, 04/14/2020   ??? INFLUENZA TIV (TRI) 11MO+ W/ PRESERV (IM) 03/12/2016   ??? INFLUENZA TIV (TRI) PF (IM) 04/01/2007, 04/01/2007, 03/31/2008, 03/31/2008, 04/24/2010, 02/18/2012   ??? Influenza Vaccine Quad (IIV4 PF) 48mo+ injectable 03/10/2013, 03/17/2014, 02/26/2018, 02/23/2019, 02/15/2020   ??? Influenza Virus Vaccine, unspecified formulation 04/29/2003, 03/25/2017   ??? Influenza, High Dose (IIV4) 65 yrs & older 03/09/2015, 03/25/2017   ??? Novel Influenza-H1N1-09 04/22/2008   ??? PNEUMOCOCCAL POLYSACCHARIDE 23 01/31/2000, 05/11/2007, 04/30/2013, 01/11/2020   ??? Pneumococcal Conjugate 13-Valent 10/27/2014   ??? SHINGRIX-ZOSTER VACCINE (HZV), RECOMBINANT,SUB-UNIT,ADJUVANTED IM 01/20/2018, 06/24/2018   ??? TdaP 10/23/2006, 05/24/2011   ??? ZOSTAVAX - ZOSTER VACCINE, LIVE, SQ 01/18/2014       REVIEW OF SYSTEMS:  Pertinent positives and negatives per HPI. A complete review of systems otherwise negative.    PHYSICAL EXAM:    Initial ED Vitals:   ED Triage Vitals [11/12/20 2223]   Enc Vitals Group      BP 128/70      Heart Rate 106      SpO2 Pulse 106      Resp 20      Temp 37.3 ??C      Temp Source Oral      SpO2 95 %      Weight       Height       Head Circumference       Peak Flow  Pain Score       Pain Loc       Pain Edu?       Excl. in GC?        Recent Vitals:  Vitals:    11/13/20 0130   BP: 122/46   Pulse: 93   Resp: 19   Temp:    SpO2: 96%       GEN: Chronically ill-appearing but not acutely ill appearing elderly female, appears older than stated age, lying in bed, NAD  Eyes: PERRL. No scleral icterus. Conjunctiva non-erythematous. EOMI.  HEENT: NCAT, MMM. Oropharynx clear.  Neck: Supple. No JVD.  Lymphadenopathy: No cervical or supraclavicular LAD.  CV: Regular rate and rhythm. No murmurs/rubs/gallops. No costochondral tenderness. Cap Refill < 2 secs  Pulm: Diffuse loud inspiratory and expiratory wheezing in all lung fields. No crackles but difficult to hear much over the wheezes. Increased work of breathing on 3L [place].   Abd: Mildly distended, tympanic to percussion.  Nontender. No guarding, rebound.  Normoactive bowel sounds.    Neuro: A&O x 3. No focal deficits. Strength 5/5 UE/LE. Distal sensation to light touch intact.  Ext: No peripheral edema.  Palpable distal pulses. +clubbing of fingers.  Skin: No rashes or skin lesions.         LABS/ STUDIES:  All imaging, laboratory studies, and other pertinent tests including electrocardiography were reviewed prior to admission and are summarized within the assessment and plan.     [name],  Medical Student    I attest that I have reviewed the student note and that the components of the history of the present illness, the physical exam, and the assessment and plan documented were performed by me or were performed in my presence by the student where I verified the documentation and performed (or re-performed) the exam and medical decision making.     Vinnie Langton Kizzie Bane MD  Family Medicine Resident, PGY-3  [facility]   11/13/20  8:41 PM

## 2020-11-14 LAB — CBC
HEMATOCRIT: 42.3 % (ref 34.0–44.0)
HEMOGLOBIN: 13.5 g/dL (ref 11.3–14.9)
MEAN CORPUSCULAR HEMOGLOBIN CONC: 32 g/dL (ref 32.0–36.0)
MEAN CORPUSCULAR HEMOGLOBIN: 28.2 pg (ref 25.9–32.4)
MEAN CORPUSCULAR VOLUME: 88.3 fL (ref 77.6–95.7)
MEAN PLATELET VOLUME: 8.5 fL (ref 6.8–10.7)
PLATELET COUNT: 232 10*9/L (ref 150–450)
RED BLOOD CELL COUNT: 4.79 10*12/L (ref 3.95–5.13)
RED CELL DISTRIBUTION WIDTH: 14.8 % (ref 12.2–15.2)
WBC ADJUSTED: 12.9 10*9/L — ABNORMAL HIGH (ref 3.6–11.2)

## 2020-11-14 LAB — BASIC METABOLIC PANEL
ANION GAP: 4 mmol/L — ABNORMAL LOW (ref 5–14)
BLOOD UREA NITROGEN: 18 mg/dL (ref 9–23)
BUN / CREAT RATIO: 16
CALCIUM: 10 mg/dL (ref 8.7–10.4)
CHLORIDE: 100 mmol/L (ref 98–107)
CO2: 35.1 mmol/L — ABNORMAL HIGH (ref 20.0–31.0)
CREATININE: 1.14 mg/dL — ABNORMAL HIGH
EGFR CKD-EPI (2021) FEMALE: 52 mL/min/{1.73_m2} — ABNORMAL LOW (ref >=60)
GLUCOSE RANDOM: 93 mg/dL (ref 70–179)
POTASSIUM: 4.3 mmol/L (ref 3.4–4.8)
SODIUM: 139 mmol/L (ref 135–145)

## 2020-11-14 LAB — C-REACTIVE PROTEIN: C-REACTIVE PROTEIN: 6 mg/L (ref <=10.0)

## 2020-11-14 LAB — CK: CREATINE KINASE TOTAL: 74 U/L

## 2020-11-14 MED ADMIN — sucralfate (CARAFATE) tablet 1 g: 1 g | ORAL

## 2020-11-14 MED ADMIN — methocarbamoL (ROBAXIN) tablet 1,000 mg: 1000 mg | ORAL

## 2020-11-14 MED ADMIN — ondansetron (ZOFRAN-ODT) disintegrating tablet 4 mg: 4 mg | ORAL | @ 12:00:00

## 2020-11-14 MED ADMIN — buPROPion (WELLBUTRIN XL) 24 hr tablet 300 mg: 300 mg | ORAL | @ 11:00:00

## 2020-11-14 MED ADMIN — pravastatin (PRAVACHOL) tablet 40 mg: 40 mg | ORAL | @ 12:00:00

## 2020-11-14 MED ADMIN — methIMAzole (TAPAZOLE) split tablet 2.5 mg: 2.5 mg | ORAL | @ 12:00:00

## 2020-11-14 MED ADMIN — diclofenac sodium (VOLTAREN) 1 % gel 2 g: 2 g | TOPICAL | @ 05:00:00

## 2020-11-14 MED ADMIN — gabapentin (NEURONTIN) capsule 600 mg: 600 mg | ORAL

## 2020-11-14 MED ADMIN — sucralfate (CARAFATE) tablet 1 g: 1 g | ORAL | @ 16:00:00

## 2020-11-14 MED ADMIN — dicyclomine (BENTYL) tablet 20 mg: 20 mg | ORAL

## 2020-11-14 MED ADMIN — perflutren lipid microspheres (DEFINITY) injection 1 mL: 1 mL | INTRAVENOUS | @ 13:00:00 | Stop: 2020-11-14

## 2020-11-14 MED ADMIN — famotidine (PEPCID) tablet 20 mg: 20 mg | ORAL | @ 12:00:00 | Stop: 2020-11-14

## 2020-11-14 MED ADMIN — hydrOXYzine (ATARAX) tablet 25 mg: 25 mg | ORAL | @ 16:00:00 | Stop: 2020-11-14

## 2020-11-14 MED ADMIN — predniSONE (DELTASONE) tablet 40 mg: 40 mg | ORAL | @ 12:00:00 | Stop: 2020-11-17

## 2020-11-14 MED ADMIN — hydroCHLOROthiazide (HYDRODIURIL) tablet 12.5 mg: 12.5 mg | ORAL | @ 12:00:00

## 2020-11-14 MED ADMIN — enoxaparin (LOVENOX) syringe 40 mg: 40 mg | SUBCUTANEOUS | @ 21:00:00

## 2020-11-14 MED ADMIN — methocarbamoL (ROBAXIN) tablet 1,000 mg: 1000 mg | ORAL | @ 16:00:00

## 2020-11-14 MED ADMIN — nicotine (NICODERM CQ) 21 mg/24 hr patch 1 patch: 1 | TRANSDERMAL | @ 12:00:00

## 2020-11-14 MED ADMIN — lidocaine (LIDODERM) 5 % patch 1 patch: 1 | TRANSDERMAL | @ 12:00:00

## 2020-11-14 MED ADMIN — sucralfate (CARAFATE) tablet 1 g: 1 g | ORAL | @ 11:00:00

## 2020-11-14 MED ADMIN — losartan (COZAAR) tablet 100 mg: 100 mg | ORAL | @ 12:00:00

## 2020-11-14 MED ADMIN — pantoprazole (PROTONIX) EC tablet 40 mg: 40 mg | ORAL | @ 11:00:00

## 2020-11-14 MED ADMIN — sucralfate (CARAFATE) tablet 1 g: 1 g | ORAL | @ 21:00:00

## 2020-11-14 MED ADMIN — dicyclomine (BENTYL) tablet 20 mg: 20 mg | ORAL | @ 12:00:00

## 2020-11-14 NOTE — Unmapped (Signed)
Family Medicine Inpatient Service    Progress Note    Team: [facility] [place] (pgr [phone])    Hospital Day: 1    ASSESSMENT / PLAN:   Lisa Andrews is a 70 y.o. 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   ??  # Acute Hypoxic Respiratory Failure:  Patient with hx of COPD and asthma on baseline 2 L O2 at home. DDx now most likely COPD exacerbation vs progression of COPD. CT Chest negative for other pulmonary pathology including idiopathic pulmonary fibrosis, worsening pulmonary HTN or new onset CHF (particularly given new LE edema). Repeat limited echo without signs of CHF. Initial POCUS had [place] B lines now s/p diuresis. For suspected COPD exacerbation s/p prednisone 60mg  x 1 in ED.   - Continue prednisone 40mg  for 4d.   - Abx not indicated per guidelines   - Resume home inhalers: Breo Ellipta  - Change to Atrovent neb q6h   - Supplemental O2 as needed, goal SpO2 88-92%  - Continuous pulse ox  - Daily CBC and BMP  - Will need repeat PFTs as outpatient for severity classification     #New Pulmonary Nodules: CT Chest with bilateral subcentimeter pulmonary nodules, centrilobular emphysema, and old granulomatous disease. Follows with Pulmonary.   - Recommended F/U CT chest in 6 months   ??  #Muscle Spasms: Patient endorses muscle spasms in toes, feet, ribcage muscles, and diaphragms daily for at least a few months. Electrolytes and TSH wnl.   - Continue lidocaine patch and Voltaren gel   ??  #HTN:   - Continue home hydrochlorothiazide 12.5 mg daily and losartan 100 mg daily    #HLD: Home regimen rosuvastatin 10mg  nightly.   - Continue formulary substitution atorvastatin 20 mg daily   - Unlikely the cause of muscle spasms     #Hyperthyroidism: Follows with endocrinology (last seen 10/16/20).   - Continue methimazole 2.5mg  daily.    #Anxiety/Depression:  Continue home buproprion 300mg  daily.  - Consider adding SSRI   - PRN hydroxyzine for anxiety     #IBS: Continue home dicyclomine 20 mg daily  - PRN Miralax   - Family to bring Linzess 145mg  from home for use    #GERD: Home regimen cimetidine 400mg  and dexlansoprazole 60mg  daily.   - Continue formulary substitution as famotidine 10 mg daily, pantoprazole 40 mg daily, and sucralfate 1g QID    #Chronic Pain: Continue gabapentin 600 mg nightly   ??  # FEN/GI:  - IVF None  - Check electrolytes as indicated, replete as needed.  ??  # Checklist:  - IVF None  - Tubes/Lines/Drains: Peripheral IV  - Diet Heart Healthy   - Bowel Regimen: Miralax 17 g daily  - DVT: SQ Lovenox  - Code Status:   Orders Placed This Encounter   Procedures   ??? Full Code     Standing Status:   Standing     Number of Occurrences:   1     - Dispo: Floor    [ ]  Anticipated Discharge Location: Home  [ ]  PT/OT/DME: PT/OT ordered  [ ]  CM/SW needs: Likely pending PT/OT recs  [ ]  Meds/Rx:  Not yet prescribed. No special med needs  [ ]  Teaching: None anticipated  [ ]  Follow up appt:  Appointment needed  [ ]  Excuse letter: None anticipated  [ ]  Transport: Private Needed    SUBJECTIVE:  Interval events: Last night Lisa Andrews requested Voltaren gel for her chest pain, which she said helped. Otherwise she did well overnight. She is very anxious and upset in the room today. She feels that not too many people have understood her.     REVIEW OF SYSTEMS:  Pertinent positives and negatives per HPI. A complete review of systems otherwise negative.    PHYSICAL EXAM:      Intake/Output Summary (Last 24 hours) at 11/14/2020 1654  Last data filed at 11/14/2020 1400  Gross per 24 hour   Intake 720 ml   Output 425 ml   Net 295 ml       Recent Vitals:  Vitals:    11/14/20 1011   BP: 121/54   Pulse: 97   Resp: 18   Temp: 36.1 ??C   SpO2: 96%       GEN: Chronically ill-appearing, but non-toxic appearing. Appears older than stated age, lying in bed.   Eyes: PERRL. No scleral icterus. Normal conjunctiva. EOMI.  HEENT: NCAT. MMM. Oropharynx clear.  Neck: Supple. No JVD. No cervical or supraclavicular LAD.  CV: Regular rate and rhythm. No murmurs/rubs/gallops.  Pulm: On 2L of O2 via [place]. Diffuse loud inspiratory and expiratory wheezing in all lung fields. No crackles, but difficult to appreciate given wheezing. Work of breathing seems improved.   Abd: Mildly distended, tympanic to percussion. Nontender. No guarding, rebound. Normoactive bowel sounds.   Neuro: A&O x 3. No focal deficits.   Ext: No peripheral edema. Palpable distal pulses. +Clubbing of fingers.  Skin: No rashes or skin lesions.      LABS/ STUDIES:    All imaging, laboratory studies, and other pertinent tests including electrocardiography within the last 24 hours were reviewed and are summarized within the assessment and plan.     NUTRITION:    [name], Medical Student    I attest that I have reviewed the student note and that the components of the history of the present illness, the physical exam, and the assessment and plan documented were performed by me or were performed in my presence by the student where I verified the documentation and performed (or re-performed) the exam and medical decision making.     Landry Dyke, MD PGY1

## 2020-11-14 NOTE — Unmapped (Signed)
OCCUPATIONAL THERAPY  Evaluation (11/14/20 1418)    Patient Name:  Lisa Andrews       Medical Record Number: [id number]   Date of Birth: [birth year]  Sex: Female          OT Treatment Diagnosis:  Decreased activity tolerance, decreased strength, decreased endurance, COPD    Assessment  Problem List: Impaired ADLs, Decreased safety awareness, Decreased endurance, Decreased mobility, Decreased range of motion, Impaired balance, Fall Risk, Decreased strength, Impaired judgement    Assessment: Mara is a 70 y.o. 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     Pt presents to OT below functional baseline with above stated deficits impacting functional safety and participation in self care and ADL tasks. Pt reports grossly mod I using rollator at baseline, though has been living with cousin for ~2weeks 2/2 requiring assist as she has been feeling unwell. Pt required CGA-Min Assist using RW for functional transfers and mobility this session, with B knees buckling (pt reports near baseline, L>R). Pt required mod cues for safety during functional transfers as she demonstrated unsafe techniques (sitting too soon, RW too far away, etc). Pt required Min-Mod Assist for bADLs this session.   Pt benefits from acute OT services to increase functional safety and participation in self care and ADL tasks. Pt remains high falls risk 2/2 instability, unsteadiness, and decreased safety awareness. Currently recommend post-acute OT at 5x/wk Low to maximize functional safety, participation, and independence with daily routines.     Today's Interventions: AMPAC 17/24, sitting tolerance and balance, figure four during LBD, functional transfers and mobility household distance, toilet transfer, UBD, standing tolerance and balance, mod cues for safety during functional transfers with RW    Activity Tolerance During Today's Session  Limited by fatigue, Limited by pain    Plan  Planned Frequency of Treatment:  1-2x per day for: 3-4x week    Planned Interventions:  Adaptive equipment, ADL retraining, Balance activities, Bed mobility, Compensatory tech. training, Conservation, Education - Patient, Functional mobility, Functional cognition, Environmental support, Endurance activities, Education - Family / caregiver, Insurance account manager / Proximal stability, Positioning, Transfer training, UE Strength / coordination exercise, Safety education    Post-Discharge Occupational Therapy Recommendations:   5x weekly, Low intensity   OT DME Recommendations: Defer to post acute (If pt to discharge home, recommend 3-in-1 BSC) -        GOALS:   Patient and Family Goals: I want to go home    IP Long Term Goal #1: Pt will maximize functional participation in preferred self care routines in 2 weeks.       Short Term:  Pt will complete BSC transfer with Supervision using LRAD.   Time Frame : 1 week  Pt will complete toileting tasks Supervision using LRAD.   Time Frame : 1 week  Pt will complete full-body dressing Min Assist using LRAD and LH AE PRN.   Time Frame : 1 week  Pt will complete +2 grooming tasks seated EOB with setup only, while Independently demonstrating energy conservation techniques.   Time Frame : 1 week    Prognosis:  Fair  Positive Indicators:  Potential PLOF, potential family support, participation  Barriers to Discharge: Endurance deficits, Inability to safely perform ADLS, Impaired Balance, Functional strength deficits, Severity of deficits    Subjective  Current Status Received sitting EOB with PT, left in bed-in-chair position, call bell oriented, all needs within reach, RN aware  Prior Functional Status Per pt report, pt grossly Mod I using Rollator for functional mobility- lives alone though has been staying with cousin (at aunt's house) for assist ~2weeks 2/2 not feeling well. Pt plans to move in with family permanently for assist as needed. Pt uses shower chair and bedroom shoes (aka slip-on shoes).    Medical Tests / Procedures: Reviewed  Services patient receives: PT    Patient / Caregiver reports: I don't like an audience re: brushing her teeth/performing ADLs for OT    Past Medical History:   Diagnosis Date   ??? Anxiety and depression    ??? Arthritis    ??? Asthma    ??? At high risk for falls 04/03/2018   ??? Bipolar disorder (CMS-HCC)    ??? COPD bronchitis    ??? Eczema    ??? GERD (gastroesophageal reflux disease)    ??? Headache    ??? Hearing impairment    ??? Hypertension    ??? Impaired mobility    ??? Lung disease    ??? Memory deficit 02/27/2018   ??? Seizures (CMS-HCC)    ??? Thyroid disease     hypothyroid in the past, now euthyroid per patient.   ??? Urinary tract infection     resolved    Social History     Tobacco Use   ??? Smoking status: Current Every Day Smoker     Packs/day: 0.50     Types: Cigarettes   ??? Smokeless tobacco: Never Used   ??? Tobacco comment: Pt smokes roughly 10cpd with intent to quit using NRT   Substance Use Topics   ??? Alcohol use: No     Alcohol/week: 0.0 standard drinks     Comment: quit 20 years ago, was heavy off and on      Past Surgical History:   Procedure Laterality Date   ??? ABDOMINAL HERNIA REPAIR     ??? GALLBLADDER SURGERY     ??? HYSTERECTOMY      TAHBSO   ??? PR UPPER GI ENDOSCOPY,DIAGNOSIS N/A 09/28/2013    Procedure: UGI ENDO, INCLUDE ESOPHAGUS, STOMACH, & DUODENUM &/OR JEJUNUM; DX W/WO COLLECTION SPECIMN, BY BRUSH OR WASH;  Surgeon: Marygrace Drought, MD;  [facility];  Service: Gastroenterology   ??? SKIN BIOPSY     ??? TOTAL ABDOMINAL HYSTERECTOMY W/ BILATERAL SALPINGOOPHORECTOMY      Family History   Problem Relation Age of Onset   ??? Arthritis Mother    ??? Dementia Mother    ??? Throat cancer Brother    ??? Diabetes Brother    ??? Cancer Maternal Uncle         Colon   ??? Glaucoma Maternal Grandmother    ??? Mental illness Daughter    ??? Substance Abuse Disorder Daughter    ??? Substance Abuse Disorder Son    ??? Colorectal Cancer Neg Hx    ??? Inflammatory bowel disease Neg Hx    ??? Melanoma Neg Hx    ??? Basal cell carcinoma Neg Hx    ??? Squamous cell carcinoma Neg Hx         Nsaids (non-steroidal anti-inflammatory drug), Promethazine hcl, Trimethoprim, Aspirin, Lipitor [atorvastatin], Sulfa (sulfonamide antibiotics), and Sulfamethoxazole     Objective Findings  Precautions / Restrictions  Falls precautions    Weight Bearing  Non-applicable    Required Braces or Orthoses  Non-applicable    Communication Preference  Verbal    Pain  Pt endorsed chest pain, educated on breathing in through nose and out through mouth.    Equipment / Environment  Vascular access (PIV, TLC, Port-a-cath, PICC), Supplemental oxygen, Patient not wearing mask for full session (OT/OTS wearing masks and eye protection.)    Living Situation  Living Environment: House  Lives With: Family (Pt lives alone at home but will be staying with her aunt and cousin after hospital stay.)  Home Living: One level home, Stairs to enter with rails, Grab bars around toilet, Standard height toilet, Handicapped height toilet, Walk-in shower, Shower chair without back (Pt will be staying with her cousin/aunt.)  Rail placement (outside): Bilateral rails  Number of Stairs to Enter (outside): 4  Equipment available at home: Rollator, Product manager, Shower chair without back     Cognition   Orientation Level:  Oriented x 4   Arousal/Alertness:  Appropriate responses to stimuli   Attention Span:  Attends with cues to redirect   Memory:  Decreased short term memory   Following Commands:  Follows one step commands without difficulty   Safety Judgment:  Decreased awareness of need for assistance, Decreased awareness of need for safety   Awareness of Errors:  Assistance required to identify errors made, Assistance required to correct errors made   Problem Solving:  Assistance required to identify errors made, Assistance required to generate solutions   Comments: Pt somewhat impulsive with functional transfers, demonstrating unsafe transfer technique - requiring repeated cues    Vision / Hearing   Vision: Glasses not present, Wears glasses for reading only     Hearing: Mild impairment         Hand Function:  Right Hand Function: Right hand grip strength, ROM and coordination WNL  Left Hand Function: Left hand grip strength, ROM and coordination WNL  Hand Dominance: Right    Skin Inspection:  Skin Inspection: Intact where visualized    ROM / Strength:  UE ROM/Strength: Right WFL, Left Impaired/Limited  LUE Impairment: Limited AROM  UE ROM/ Strength Comment: L shoulder flexion ~90* with increased time  LE ROM/Strength: Left Impaired/Limited, Right Impaired/Limited  RLE Impairment: Reduced strength  LLE Impairment: Reduced strength  LE ROM/ Strength Comment: Pt unable to maintain figure four position with LLE at EOB; B knee buckling, L>R, during functional mobility and standing with RW    Coordination:  Coordination: WFL    Sensation:  RUE Sensation: RUE intact  LUE Sensation: LUE intact  RLE Sensation: RLE intact  LLE Sensation: LLE intact    Balance:  Static and dynamic sitting balance=Mod I at EOB, though after functional mobility, Supervision 2/2 some swaying and dizziness; static standing balance=CGA-Min Assist with RW, B knee buckling L>R; standing tolerance ~38minutes- unable to progress to dynamic standing balance 2/2 reliance on RW frame    Functional Mobility  Transfer Assistance Needed: Yes  Transfers - Needs Assistance: Min assist (Min Assist sit<>stand EOB and toilet transfer using RW; max cues to safe positioning as pt consistently began stand>sit transfers too far from surface)  Bed Mobility Assistance Needed: No (Sit>supine Mod I with HOB elevated ~30*, pt able to scoot towards HOB with min cues and increased time 2/2 some chest pain)  Ambulation: Functional mobility household distance bed<>bathroom CGA with RW with B knee buckling throughout, L>R      ADLs  ADLs: Needs assistance with ADLs  ADLs - Needs Assistance: LB dressing, UB dressing, Toileting, Bathing, Grooming  Grooming -  Needs Assistance:  (Setup-Min Assist seated (2/2 decreased LUE ROM))  Bathing - Needs Assistance: Min assist  Toileting - Needs Assistance: Mod assist (unable to demonstrate dynamic balance this date)  UB Dressing - Needs Assistance: Min assist  LB Dressing - Needs Assistance: Mod assist (unable to demonstrate dynamic balance this date; unable to maintain figure four position with LLE)      Vitals / Orthostatics  At Rest: SpO2 92-94% seated EOB on 2L O2 [place]  With Activity: SpO2 dropped to 89% after functional mobility bed<>bathroom on 2L [place], recovered to 92% after ~30seconds PLB  Orthostatics: Brief c/o dizziness after functional mobility, see above      Medical Staff Made Aware: RN Lenon Ahmadi., CM Roxan Diesel      Occupational Therapy Session Duration  OT Individual [mins]: 33         I attest that I have reviewed the above information.  Signed: Michelene Gardener Juan Kissoon, OT  Filed 11/14/2020    The care for this patient was completed by Jessica Priest, OT:  A student was present and participated in the care. Licensed/Credentialed therapist was physically present and immediately available to direct and supervise tasks that were related to patient management. The direction and supervision was continuous throughout the time these tasks were performed.    Michelene Gardener Lenae Wherley, OT

## 2020-11-14 NOTE — Unmapped (Signed)
CM met with patient in pt room.  Pt/visitors were wearing hospital provided masks for the duration of the interaction with CM. CM was wearing hospital provided surgical mask and hospital provided eye protection.  CM was not within 6 foot of the patient/visitors during this interaction. Patient stated she was fine and had no needs at this time.     Care Management  Initial Transition Planning Assessment   Type of Residence: Mailing Address:  [address]  [place] [address]  Contacts: Accompanied by: Alone Extended Emergency Contact Information  Primary Emergency Contact: Mamie Nick [place] of [place]  Home Phone: [phone]  Mobile Phone: [phone]  Relation: Brother  Secondary Emergency Contact: Alford Highland  Address: [address]           [place], [place] [address] [place]  Home Phone: [phone]  Relation: Daughter    Patient Phone Number: [phone] (home)           Medical Provider(s): Othelia Pulling, MD  Reason for Admission: Admitting Diagnosis:  Shortness of breath [R06.02]  COPD exacerbation (CMS-HCC) [J44.1]  Past Medical History:   has a past medical history of Anxiety and depression, Arthritis, Asthma, At high risk for falls (04/03/2018), Bipolar disorder (CMS-HCC), COPD bronchitis, Eczema, GERD (gastroesophageal reflux disease), Headache, Hearing impairment, Hypertension, Impaired mobility, Lung disease, Memory deficit (02/27/2018), Seizures (CMS-HCC), Thyroid disease, and Urinary tract infection.  Past Surgical History:   has a past surgical history that includes Gallbladder surgery; Total abdominal hysterectomy w/ bilateral salpingoophorectomy; Abdominal hernia repair; pr upper gi endoscopy,diagnosis (N/A, 09/28/2013); Hysterectomy; and Skin biopsy.   Previous admit date: N/A    Editor, commissioning- Payor: Advertising copywriter MEDICARE ADV / Plan: UNITED HEALTHCARE DUAL COMPLETE HMO / Product Type: *No Product type* /   Secondary Insurance - Secondary Insurance MEDICAID [place]  Prescription Coverage - Medicaid  Preferred Pharmacy - [facility] - [place],  - [address]  [facility] - [place],  - [address]  [facility]    Transportation home: Private vehicle, cousin                   General  Care Manager assessed the patient by : In person interview with patient, Medical record review, Discussion with Clinical Care team  Orientation Level: Oriented X4  Functional level prior to admission: Independent  Reason for referral: Discharge Planning    Contact/Decision Maker  Extended Emergency Contact Information  Primary Emergency Contact: Mamie Nick [place] of [place]  Home Phone: [phone]  Mobile Phone: [phone]  Relation: Brother  Secondary Emergency Contact: Alford Highland  Address: [address]           [place], [place] [address] [place]  Home Phone: [phone]  Relation: Daughter    Legal Next of Kin / Guardian / POA / Advance Directives       Advance Directive (Medical Treatment)  Does patient have an advance directive covering medical treatment?: Patient does not have advance directive covering medical treatment.  Reason patient does not have an advance directive covering medical treatment:: Patient does not wish to complete one at this time.    Health Care Decision Maker [HCDM] (Medical & Mental Health Treatment)  Healthcare Decision Maker: HCDM documented in the HCDM/Contact Info section.  Information offered on HCDM, Medical & Mental Health advance directives:: Patient declined information.    Advance Directive (Mental Health Treatment)  Does  patient have an advance directive covering mental health treatment?: Patient does not have advance directive covering mental health treatment.  Reason patient does not have an advance directive covering mental health treatment:: Patient does not wish to complete one at this time.    Patient Information  Lives with: Alone    Type of Residence: Private residence        Location/Detail: [address] [place], [place], however moving in with cousin and aunt soon    Support Systems/Concerns: Family Members, Friends/Neighbors    Responsibilities/Dependents at home?: No    Home Care services in place prior to admission?: No          Outpatient/Community Resources in place prior to admission: Clinic  Agency detail (Name/Phone #): (PCP) [facility]    Equipment Currently Used at Home: cane, straight, walker, rolling, oxygen  Current HME Agency (Name/Phone #): [facility]    Currently receiving outpatient dialysis?: No       Financial Information       Need for financial assistance?: No       Social Determinants of Health  Social Determinants of Health were addressed in provider documentation.  Please refer to patient history.  Social Determinants of Health     Tobacco Use: High Risk   ??? Smoking Tobacco Use: Current Every Day Smoker   ??? Smokeless Tobacco Use: Never Used   Alcohol Use: Not At Risk   ??? How often do you have a drink containing alcohol?: Never   ??? How many drinks containing alcohol do you have on a typical day when you are drinking?: Not on file   ??? How often do you have 5 or more drinks on one occasion?: Not on file   Financial Resource Strain: Low Risk    ??? Difficulty of Paying Living Expenses: Not hard at all   Food Insecurity: No Food Insecurity   ??? Worried About Programme researcher, broadcasting/film/video in the Last Year: Never true   ??? Ran Out of Food in the Last Year: Never true   Transportation Needs: No Transportation Needs   ??? Lack of Transportation (Medical): No   ??? Lack of Transportation (Non-Medical): No   Physical Activity: Not on file   Stress: Stress Concern Present   ??? Feeling of Stress : Rather much   Social Connections: Not on file   Intimate Partner Violence: Not At Risk   ??? Fear of Current or Ex-Partner: No   ??? Emotionally Abused: No   ??? Physically Abused: No   ??? Sexually Abused: No   Depression: At risk ??? PHQ-2 Score: 3   Housing/Utilities: Low Risk    ??? Within the past 12 months, have you ever stayed: outside, in a car, in a tent, in an overnight shelter, or temporarily in someone else's home (i.e. couch-surfing)?: No   ??? Are you worried about losing your housing?: No   ??? Within the past 12 months, have you been unable to get utilities (heat, electricity) when it was really needed?: No   Substance Use: Low Risk    ??? Taken prescription drugs for non-medical reasons: Never   ??? Taken illegal drugs: Never   ??? Patient indicated they have taken drugs in the past year for non-medical reasons: Yes, [positive answer(s)]: Not on file   Health Literacy: Low Risk    ??? : Never       Discharge Needs Assessment  Concerns to be Addressed: denies needs/concerns at this time, discharge planning  Clinical Risk Factors: > 65, Multiple Diagnoses (Chronic)    Barriers to taking medications: No    Prior overnight hospital stay or ED visit in last 90 days: Yes    Readmission Within the Last 30 Days: previous discharge plan unsuccessful         Anticipated Changes Related to Illness: none    Equipment Needed After Discharge: none    Discharge Facility/Level of Care Needs: other (see comments) (Home with self care)    Readmission  Risk of Unplanned Readmission Score: UNPLANNED READMISSION SCORE: 19.14%  Predictive Model Details          19% (Medium)  Factor Value    Calculated 11/14/2020 12:03 38% Number of active Rx orders 55    [place] Risk of Unplanned Readmission Model 22% Number of ED visits in last six months 4     9% ECG/EKG order present in last 6 months     7% Imaging order present in last 6 months     6% Age 70     4% Active anticoagulant Rx order present     4% Active corticosteroid Rx order present     4% Latest creatinine high (1.14 mg/dL)     2% Charlson Comorbidity Index 2     2% Future appointment scheduled     1% Current length of stay 1.46 days     1% Active ulcer medication Rx order present      Readmitted Within the Last 30 Days? (No if blank)   Patient at risk for readmission?: No    Discharge Plan  Screen findings are: Care Manager reviewed the plan of the patient's care with the Multidisciplinary Team. No discharge planning needs identified at this time. Care Manager will continue to manage plan and monitor patient's progress with the team.    Expected Discharge Date: 11/15/2020    Expected Transfer from Critical Care:  (N/A)    Quality data for continuing care services shared with patient and/or representative?: N/A  Patient and/or family were provided with choice of facilities / services that are available and appropriate to meet post hospital care needs?: Yes   List choices in order highest to lowest preferred, if applicable. : patient stated she was fine and had no needs.    Initial Assessment complete?: Yes

## 2020-11-14 NOTE — Unmapped (Signed)
PHYSICAL THERAPY  Evaluation (11/14/20 1345)          Patient Name:?? Lisa Andrews????????   Medical Record Number: [id number]   Date of Birth: 03-Feb-1951  Sex: Female??  ??    Treatment Diagnosis: eval and treat     Activity Tolerance: Limited by fatigue     ASSESSMENT  Problem List: Decreased strength, Gait deviation, Decreased endurance, Postural Weakness, Fall Risk, Impaired balance, Decreased mobility, Shortness of breath      Assessment : Kensli is a 70 y.o. 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      Pt presents to PT with decreased endurance, weakness, and gait instability and would benefit from acute PT services to address objective findings.  Pt required SBA for sit<>stand transfers using RW and ambulates with CGA for 60' with RW, stopping 1x to rest leaning against wall due to feeling chest pain which resolved quickly and spontaneously.  Pt with knee instability which is chronic and documented on previous visit.  Pt would like to get stronger however is frustrated with not knowing the cause of her symptoms/weakness.  Recommend post acute services 3x/week with 24/7 supervision and wheelchair.      Today's Interventions: AMPAC 20/24; Eval and POC; paitent education on role of PT and progression of PT services; transfer training and gait training; pursed lip breathing; energy conservation techniques.     PLAN  Planned Frequency of Treatment:?? 1-2x per day for: 3-4x week       Planned Interventions: Balance activities, Education - Patient, Endurance activities, Museum/gallery curator, Therapeutic exercise, Therapeutic activity, Transfer training     Post-Discharge Physical Therapy Recommendations:?? 3x weekly (24/7 support)     PT DME Recommendations: Wheelchair??????????       Goals:   Patient and Family Goals: To get stronger and figure out why she is getting weaker.     SHORT GOAL #1: Pt will perform sit<>stand transfers with supervision assist using LRAD.  ?????????????????????? Time Frame : 1 week     SHORT GOAL #2: Pt will ambulate for 77' with LRAD and supervision assist.  ?????????????????????? Time Frame : 1 week     SHORT GOAL #3: Pt will go up/down 4 steps with rail and SBA.  ?????????????????????? Time Frame : 1 week         Prognosis:?? Fair     Barriers to Discharge: Endurance deficits     SUBJECTIVE  Patient reports: I ain't going to a rehab.  Current Functional Status: Pt asleep in bed at beginning of session, arouses with voice, reports she took a muscle relaxer and atarax; pt left sitting EOB at end of session with OT.  Services patient receives: PT  Prior Functional Status: Pt states that her cousin has been staying with her and that they are in the process of moving to pt's Aunt's house and that is where she will discharge to.  Equipment available at home: Rollator      Past Medical History:   Diagnosis Date   ??? Anxiety and depression    ??? Arthritis    ??? Asthma    ??? At high risk for falls 04/03/2018   ??? Bipolar disorder (CMS-HCC)    ??? COPD bronchitis    ??? Eczema    ??? GERD (gastroesophageal reflux disease)    ???  Headache    ??? Hearing impairment    ??? Hypertension    ??? Impaired mobility    ??? Lung disease    ??? Memory deficit 02/27/2018   ??? Seizures (CMS-HCC)    ??? Thyroid disease     hypothyroid in the past, now euthyroid per patient.   ??? Urinary tract infection     resolved            Social History     Tobacco Use   ??? Smoking status: Current Every Day Smoker     Packs/day: 0.50     Types: Cigarettes   ??? Smokeless tobacco: Never Used   ??? Tobacco comment: Pt smokes roughly 10cpd with intent to quit using NRT   Substance Use Topics   ??? Alcohol use: No     Alcohol/week: 0.0 standard drinks     Comment: quit 20 years ago, was heavy off and on       Past Surgical History:   Procedure Laterality Date   ??? ABDOMINAL HERNIA REPAIR     ??? GALLBLADDER SURGERY     ??? HYSTERECTOMY      TAHBSO   ??? PR UPPER GI ENDOSCOPY,DIAGNOSIS N/A 09/28/2013    Procedure: UGI ENDO, INCLUDE ESOPHAGUS, STOMACH, & DUODENUM &/OR JEJUNUM; DX W/WO COLLECTION SPECIMN, BY BRUSH OR WASH;  Surgeon: Marygrace Drought, MD;  [facility];  Service: Gastroenterology   ??? SKIN BIOPSY     ??? TOTAL ABDOMINAL HYSTERECTOMY W/ BILATERAL SALPINGOOPHORECTOMY               Family History   Problem Relation Age of Onset   ??? Arthritis Mother    ??? Dementia Mother    ??? Throat cancer Brother    ??? Diabetes Brother    ??? Cancer Maternal Uncle         Colon   ??? Glaucoma Maternal Grandmother    ??? Mental illness Daughter    ??? Substance Abuse Disorder Daughter    ??? Substance Abuse Disorder Son    ??? Colorectal Cancer Neg Hx    ??? Inflammatory bowel disease Neg Hx    ??? Melanoma Neg Hx    ??? Basal cell carcinoma Neg Hx    ??? Squamous cell carcinoma Neg Hx         Allergies: Nsaids (non-steroidal anti-inflammatory drug), Promethazine hcl, Trimethoprim, Aspirin, Lipitor [atorvastatin], Sulfa (sulfonamide antibiotics), and Sulfamethoxazole      Objective Findings  Precautions / Restrictions  Precautions: Falls precautions  Weight Bearing Status: Non-applicable  Required Braces or Orthoses: Non-applicable     Communication Preference: Verbal          Pain Comments: chest pain and cramping, back pain  Medical Tests / Procedures: reviewed in chart  Equipment / Environment: Vascular access (PIV, TLC, Port-a-cath, PICC), Supplemental oxygen (2L)     At Rest: SpO2 94% on 2L  With Activity: SpO2 88-91% on 2L     Airway Clearance: spontaneous cough     Living Situation  Living Environment: House  Lives With:  (cousin and aunt)  Home Living: One level home, Stairs to enter with rails  Number of Stairs to Psychologist, sport and exercise (outside): 4      Cognition: WFL  Visual/Perception: Within Systems developer     Skin Inspection: Intact where visualized     Upper Extremities  UE ROM: Right WFL, Left WFL  UE Strength: Right WFL, Left WFL    Lower Extremities  LE ROM: Right  WFL, Left WFL  LE Strength: Right Impaired/Limited, Left Impaired/Limited  RLE Strength Impairment: Reduced strength  LLE Strength Impairment: Reduced strength  LE comment: h/o OA R>L     Coordination: WFL  Sensation: WFL  Balance: Standing balance (needs UE support)  Posture: WFL      Bed Mobility: Supine to Sit  Supine to Sit assistance level: Modified independent, requires aide device or extra time     Transfers: Sit to Stand  Sit to Stand assistance level: Standby assist, set-up cues, supervision of patient - no hands on  Transfer comments: Pt required 2 trials to stand and maintain balance without sitting back down on bed.      Gait Level of Assistance: Contact guard assist, steadying assist  Gait Assistive Device: Front wheel walker  Gait Distance Ambulated (ft): 60 ft  Gait: Pt amb for 60' with RW and CGA, flexed posture and L knee giving way, pt states she has sciatica and OA which contributes to her instability.      Endurance: DOE, requires 2L O2     Physical Therapy Session Duration  PT Individual [mins]: 40     Medical Staff Made Aware: RN     I attest that I have reviewed the above information.  Signed: Anders Grant, PT  Filed 11/14/2020

## 2020-11-14 NOTE — Unmapped (Addendum)
Pt. Having severe muscle spasms in her left leg. Robaxin administered as ordered and was effective. Pt. Requested voltaren gel.  MD contacted and ordered voltaren gel. Breathing treatments administered by RT. Pt. Receiving O2 at 2L via [place]. Pt. Is very anxious about leg pain and SOB. Alert and oriented x 4.  PIV flushed and maintained. Pt. Stated that she feels like she is dying. She spoke a lot about her deceased mother. Pt. Hungry later in the shift and was given graham crackers and ice cream. Pt. Ate all of her snack. Pt. Less anxious and feeling better by morning. All safety measures maintained. Will continue to monitor.     Problem: Adult Inpatient Plan of Care  Goal: Plan of Care Review  Outcome: Progressing  Goal: Patient-Specific Goal (Individualized)  Outcome: Progressing  Goal: Absence of Hospital-Acquired Illness or Injury  Outcome: Progressing  Intervention: Identify and Manage Fall Risk  Recent Flowsheet Documentation  Taken 11/14/2020 0400 by Rhina Brackett, RN  Safety Interventions:  ??? fall reduction program maintained  ??? low bed  Taken 11/14/2020 0200 by Rhina Brackett, RN  Safety Interventions:  ??? fall reduction program maintained  ??? low bed  Taken 11/14/2020 0000 by Rhina Brackett, RN  Safety Interventions:  ??? fall reduction program maintained  ??? low bed  Taken 11/13/2020 2200 by Rhina Brackett, RN  Safety Interventions: fall reduction program maintained  Taken 11/13/2020 2000 by Rhina Brackett, RN  Safety Interventions:  ??? fall reduction program maintained  ??? low bed  Goal: Optimal Comfort and Wellbeing  Outcome: Progressing  Goal: Readiness for Transition of Care  Outcome: Progressing  Goal: Rounds/Family Conference  Outcome: Progressing     Problem: Self-Care Deficit  Goal: Improved Ability to Complete Activities of Daily Living  Outcome: Progressing     Problem: Fall Injury Risk  Goal: Absence of Fall and Fall-Related Injury  Outcome: Progressing  Intervention: Promote Injury-Free Environment  Recent Flowsheet Documentation  Taken 11/14/2020 0400 by Rhina Brackett, RN  Safety Interventions:  ??? fall reduction program maintained  ??? low bed  Taken 11/14/2020 0200 by Rhina Brackett, RN  Safety Interventions:  ??? fall reduction program maintained  ??? low bed  Taken 11/14/2020 0000 by Rhina Brackett, RN  Safety Interventions:  ??? fall reduction program maintained  ??? low bed  Taken 11/13/2020 2200 by Rhina Brackett, RN  Safety Interventions: fall reduction program maintained  Taken 11/13/2020 2000 by Rhina Brackett, RN  Safety Interventions:  ??? fall reduction program maintained  ??? low bed     Problem: Asthma Comorbidity  Goal: Maintenance of Asthma Control  Outcome: Progressing     Problem: COPD (Chronic Obstructive Pulmonary Disease) Comorbidity  Goal: Maintenance of COPD Symptom Control  Outcome: Progressing     Problem: Diabetes Comorbidity  Goal: Blood Glucose Level Within Targeted Range  Outcome: Progressing     Problem: Hypertension Comorbidity  Goal: Blood Pressure in Desired Range  Outcome: Progressing     Problem: Adjustment to Illness (Heart Failure)  Goal: Optimal Coping  Outcome: Progressing     Problem: Cardiac Output Decreased (Heart Failure)  Goal: Optimal Cardiac Output  Outcome: Progressing     Problem: Dysrhythmia (Heart Failure)  Goal: Stable Heart Rate and Rhythm  Outcome: Progressing     Problem: Fluid Imbalance (Heart Failure)  Goal: Fluid Balance  Outcome: Progressing     Problem: Functional Ability Impaired (Heart Failure)  Goal: Optimal Functional Ability  Outcome:  Progressing     Problem: Oral Intake Inadequate (Heart Failure)  Goal: Optimal Nutrition Intake  Outcome: Progressing     Problem: Respiratory Compromise (Heart Failure)  Goal: Effective Oxygenation and Ventilation  Outcome: Progressing     Problem: Sleep Disordered Breathing (Heart Failure)  Goal: Effective Breathing Pattern During Sleep  Outcome: Progressing     Problem: Skin Injury Risk Increased  Goal: Skin Health and Integrity  Outcome: Progressing  Intervention: Optimize Skin Protection  Recent Flowsheet Documentation  Taken 11/14/2020 0200 by Rhina Brackett, RN  Pressure Reduction Techniques: frequent weight shift encouraged  Pressure Reduction Devices: pressure-redistributing mattress utilized  Taken 11/14/2020 0000 by Rhina Brackett, RN  Pressure Reduction Techniques: frequent weight shift encouraged  Taken 11/13/2020 2200 by Rhina Brackett, RN  Pressure Reduction Techniques: frequent weight shift encouraged  Taken 11/13/2020 2000 by Rhina Brackett, RN  Pressure Reduction Techniques: frequent weight shift encouraged  Pressure Reduction Devices: pressure-redistributing mattress utilized

## 2020-11-15 ENCOUNTER — Encounter: Admit: 2020-11-15 | Payer: MEDICARE

## 2020-11-15 LAB — BASIC METABOLIC PANEL
ANION GAP: 5 mmol/L (ref 5–14)
BLOOD UREA NITROGEN: 23 mg/dL (ref 9–23)
BUN / CREAT RATIO: 19
CALCIUM: 9.5 mg/dL (ref 8.7–10.4)
CHLORIDE: 102 mmol/L (ref 98–107)
CO2: 30.6 mmol/L (ref 20.0–31.0)
CREATININE: 1.22 mg/dL — ABNORMAL HIGH
EGFR CKD-EPI (2021) FEMALE: 48 mL/min/{1.73_m2} — ABNORMAL LOW (ref >=60)
GLUCOSE RANDOM: 81 mg/dL (ref 70–179)
POTASSIUM: 3.6 mmol/L (ref 3.4–4.8)
SODIUM: 138 mmol/L (ref 135–145)

## 2020-11-15 LAB — CBC
HEMATOCRIT: 42.5 % (ref 34.0–44.0)
HEMOGLOBIN: 13.6 g/dL (ref 11.3–14.9)
MEAN CORPUSCULAR HEMOGLOBIN CONC: 32 g/dL (ref 32.0–36.0)
MEAN CORPUSCULAR HEMOGLOBIN: 28.3 pg (ref 25.9–32.4)
MEAN CORPUSCULAR VOLUME: 88.3 fL (ref 77.6–95.7)
MEAN PLATELET VOLUME: 8.8 fL (ref 6.8–10.7)
PLATELET COUNT: 223 10*9/L (ref 150–450)
RED BLOOD CELL COUNT: 4.81 10*12/L (ref 3.95–5.13)
RED CELL DISTRIBUTION WIDTH: 14.9 % (ref 12.2–15.2)
WBC ADJUSTED: 13.4 10*9/L — ABNORMAL HIGH (ref 3.6–11.2)

## 2020-11-15 MED ORDER — DICYCLOMINE 20 MG TABLET
ORAL_TABLET | Freq: Two times a day (BID) | ORAL | 0 refills | 30 days | Status: CN
Start: 2020-11-15 — End: 2020-12-15

## 2020-11-15 MED ADMIN — pravastatin (PRAVACHOL) tablet 40 mg: 40 mg | ORAL | @ 12:00:00 | Stop: 2020-11-15

## 2020-11-15 MED ADMIN — levalbuterol (XOPENEX) nebulizer solution 0.31 mg: .31 mg | RESPIRATORY_TRACT | @ 16:00:00 | Stop: 2020-11-15

## 2020-11-15 MED ADMIN — nicotine (NICODERM CQ) 21 mg/24 hr patch 1 patch: 1 | TRANSDERMAL | @ 13:00:00 | Stop: 2020-11-15

## 2020-11-15 MED ADMIN — sucralfate (CARAFATE) tablet 1 g: 1 g | ORAL | @ 16:00:00 | Stop: 2020-11-15

## 2020-11-15 MED ADMIN — predniSONE (DELTASONE) tablet 40 mg: 40 mg | ORAL | @ 12:00:00 | Stop: 2020-11-15

## 2020-11-15 MED ADMIN — lidocaine (LIDODERM) 5 % patch 1 patch: 1 | TRANSDERMAL | @ 13:00:00 | Stop: 2020-11-15

## 2020-11-15 MED ADMIN — diclofenac sodium (VOLTAREN) 1 % gel 2 g: 2 g | TOPICAL | @ 09:00:00 | Stop: 2020-11-15

## 2020-11-15 MED ADMIN — sucralfate (CARAFATE) tablet 1 g: 1 g | ORAL | @ 09:00:00 | Stop: 2020-11-15

## 2020-11-15 MED ADMIN — methIMAzole (TAPAZOLE) split tablet 2.5 mg: 2.5 mg | ORAL | @ 14:00:00 | Stop: 2020-11-15

## 2020-11-15 MED ADMIN — pantoprazole (PROTONIX) EC tablet 40 mg: 40 mg | ORAL | @ 09:00:00 | Stop: 2020-11-15

## 2020-11-15 MED ADMIN — traMADoL (ULTRAM) tablet 50 mg: 50 mg | ORAL | @ 12:00:00 | Stop: 2020-11-15

## 2020-11-15 MED ADMIN — diclofenac sodium (VOLTAREN) 1 % gel 2 g: 2 g | TOPICAL | @ 02:00:00

## 2020-11-15 MED ADMIN — sucralfate (CARAFATE) tablet 1 g: 1 g | ORAL | @ 02:00:00

## 2020-11-15 MED ADMIN — gabapentin (NEURONTIN) capsule 300 mg: 300 mg | ORAL | @ 14:00:00 | Stop: 2020-11-15

## 2020-11-15 MED ADMIN — dicyclomine (BENTYL) tablet 20 mg: 20 mg | ORAL | @ 02:00:00

## 2020-11-15 MED ADMIN — losartan (COZAAR) tablet 100 mg: 100 mg | ORAL | @ 12:00:00 | Stop: 2020-11-15

## 2020-11-15 MED ADMIN — famotidine (PEPCID) tablet 10 mg: 10 mg | ORAL | @ 14:00:00 | Stop: 2020-11-15

## 2020-11-15 MED ADMIN — gabapentin (NEURONTIN) capsule 600 mg: 600 mg | ORAL | @ 02:00:00

## 2020-11-15 MED ADMIN — methocarbamoL (ROBAXIN) tablet 1,000 mg: 1000 mg | ORAL | @ 09:00:00 | Stop: 2020-11-15

## 2020-11-15 MED ADMIN — methocarbamoL (ROBAXIN) tablet 1,000 mg: 1000 mg | ORAL | @ 16:00:00 | Stop: 2020-11-15

## 2020-11-15 MED ADMIN — levalbuterol (XOPENEX) nebulizer solution 0.31 mg: .31 mg | RESPIRATORY_TRACT | @ 02:00:00

## 2020-11-15 MED ADMIN — levalbuterol (XOPENEX) nebulizer solution 0.31 mg: .31 mg | RESPIRATORY_TRACT | @ 09:00:00 | Stop: 2020-11-15

## 2020-11-15 MED ADMIN — diclofenac sodium (VOLTAREN) 1 % gel 2 g: 2 g | TOPICAL | @ 16:00:00 | Stop: 2020-11-15

## 2020-11-15 MED ADMIN — fluticasone furoate-vilanteroL (BREO ELLIPTA) 100-25 mcg/dose inhaler 1 puff: 1 | RESPIRATORY_TRACT | @ 16:00:00 | Stop: 2020-11-15

## 2020-11-15 MED ADMIN — hydroCHLOROthiazide (HYDRODIURIL) tablet 12.5 mg: 12.5 mg | ORAL | @ 12:00:00 | Stop: 2020-11-15

## 2020-11-15 MED ADMIN — buPROPion (WELLBUTRIN XL) 24 hr tablet 300 mg: 300 mg | ORAL | @ 10:00:00 | Stop: 2020-11-15

## 2020-11-15 MED ADMIN — hydrOXYzine (ATARAX) tablet 25 mg: 25 mg | ORAL | @ 14:00:00 | Stop: 2020-11-15

## 2020-11-15 MED ADMIN — dicyclomine (BENTYL) tablet 20 mg: 20 mg | ORAL | @ 14:00:00 | Stop: 2020-11-15

## 2020-11-15 NOTE — Unmapped (Signed)
Problem: Adult Inpatient Plan of Care  Goal: Plan of Care Review  11/15/2020 1557 by Diannia Ruder, RN  Outcome: Discharged to Home  11/15/2020 [phone] by Diannia Ruder, RN  Outcome: Ongoing - Unchanged  Goal: Patient-Specific Goal (Individualized)  11/15/2020 1557 by Diannia Ruder, RN  Outcome: Discharged to Home  11/15/2020 [phone] by Diannia Ruder, RN  Outcome: Ongoing - Unchanged  Goal: Absence of Hospital-Acquired Illness or Injury  11/15/2020 1557 by Diannia Ruder, RN  Outcome: Discharged to Home  11/15/2020 0952 by Diannia Ruder, RN  Outcome: Ongoing - Unchanged  Intervention: Identify and Manage Fall Risk  Recent Flowsheet Documentation  Taken 11/15/2020 1400 by Diannia Ruder, RN  Safety Interventions:   fall reduction program maintained   low bed  Taken 11/15/2020 1200 by Diannia Ruder, RN  Safety Interventions:   fall reduction program maintained   low bed   nonskid shoes/slippers when out of bed  Taken 11/15/2020 1000 by Diannia Ruder, RN  Safety Interventions:   fall reduction program maintained   low bed   nonskid shoes/slippers when out of bed  Taken 11/15/2020 0800 by Diannia Ruder, RN  Safety Interventions:   fall reduction program maintained   low bed   nonskid shoes/slippers when out of bed  Intervention: Prevent and Manage VTE (Venous Thromboembolism) Risk  Recent Flowsheet Documentation  Taken 11/15/2020 0800 by Diannia Ruder, RN  Activity Management: activity adjusted per tolerance  Goal: Optimal Comfort and Wellbeing  11/15/2020 1557 by Diannia Ruder, RN  Outcome: Discharged to Home  11/15/2020 0952 by Diannia Ruder, RN  Outcome: Ongoing - Unchanged  Goal: Readiness for Transition of Care  11/15/2020 1557 by Diannia Ruder, RN  Outcome: Discharged to Home  11/15/2020 [phone] by Diannia Ruder, RN  Outcome: Ongoing - Unchanged  Goal: Rounds/Family Conference  11/15/2020 1557 by Diannia Ruder, RN  Outcome: Discharged to Home  11/15/2020 [phone] by Diannia Ruder, RN  Outcome: Ongoing - Unchanged     Problem: Self-Care Deficit  Goal: Improved Ability to Complete Activities of Daily Living  11/15/2020 1557 by Diannia Ruder, RN  Outcome: Discharged to Home  11/15/2020 [phone] by Diannia Ruder, RN  Outcome: Ongoing - Unchanged     Problem: Fall Injury Risk  Goal: Absence of Fall and Fall-Related Injury  11/15/2020 1557 by Diannia Ruder, RN  Outcome: Discharged to Home  11/15/2020 [phone] by Diannia Ruder, RN  Outcome: Ongoing - Unchanged  Intervention: Promote Injury-Free Environment  Recent Flowsheet Documentation  Taken 11/15/2020 1400 by Diannia Ruder, RN  Safety Interventions:   fall reduction program maintained   low bed  Taken 11/15/2020 1200 by Diannia Ruder, RN  Safety Interventions:   fall reduction program maintained   low bed   nonskid shoes/slippers when out of bed  Taken 11/15/2020 1000 by Diannia Ruder, RN  Safety Interventions:   fall reduction program maintained   low bed   nonskid shoes/slippers when out of bed  Taken 11/15/2020 0800 by Diannia Ruder, RN  Safety Interventions:   fall reduction program maintained   low bed   nonskid shoes/slippers when out of bed     Problem: Asthma Comorbidity  Goal: Maintenance of Asthma Control  11/15/2020 1557 by Diannia Ruder, RN  Outcome: Discharged to Home  11/15/2020 0952 by Diannia Ruder, RN  Outcome: Ongoing - Unchanged     Problem:  COPD (Chronic Obstructive Pulmonary Disease) Comorbidity  Goal: Maintenance of COPD Symptom Control  11/15/2020 1557 by Diannia Ruder, RN  Outcome: Discharged to Home  11/15/2020 [phone] by Diannia Ruder, RN  Outcome: Ongoing - Unchanged     Problem: Diabetes Comorbidity  Goal: Blood Glucose Level Within Targeted Range  11/15/2020 1557 by Diannia Ruder, RN  Outcome: Discharged to Home  11/15/2020 0952 by Diannia Ruder, RN  Outcome: Ongoing - Unchanged     Problem: Hypertension Comorbidity  Goal: Blood Pressure in Desired Range  11/15/2020 1557 by Diannia Ruder, RN  Outcome: Discharged to Home  11/15/2020 0952 by Diannia Ruder, RN  Outcome: Ongoing - Unchanged     Problem: Adjustment to Illness (Heart Failure)  Goal: Optimal Coping  11/15/2020 1557 by Diannia Ruder, RN  Outcome: Discharged to Home  11/15/2020 0952 by Diannia Ruder, RN  Outcome: Ongoing - Unchanged     Problem: Cardiac Output Decreased (Heart Failure)  Goal: Optimal Cardiac Output  11/15/2020 1557 by Diannia Ruder, RN  Outcome: Discharged to Home  11/15/2020 0952 by Diannia Ruder, RN  Outcome: Ongoing - Unchanged     Problem: Dysrhythmia (Heart Failure)  Goal: Stable Heart Rate and Rhythm  11/15/2020 1557 by Diannia Ruder, RN  Outcome: Discharged to Home  11/15/2020 0952 by Diannia Ruder, RN  Outcome: Ongoing - Unchanged     Problem: Fluid Imbalance (Heart Failure)  Goal: Fluid Balance  11/15/2020 1557 by Diannia Ruder, RN  Outcome: Discharged to Home  11/15/2020 0952 by Diannia Ruder, RN  Outcome: Ongoing - Unchanged     Problem: Functional Ability Impaired (Heart Failure)  Goal: Optimal Functional Ability  11/15/2020 1557 by Diannia Ruder, RN  Outcome: Discharged to Home  11/15/2020 [phone] by Diannia Ruder, RN  Outcome: Ongoing - Unchanged  Intervention: Optimize Functional Ability  Recent Flowsheet Documentation  Taken 11/15/2020 0800 by Diannia Ruder, RN  Activity Management: activity adjusted per tolerance     Problem: Oral Intake Inadequate (Heart Failure)  Goal: Optimal Nutrition Intake  11/15/2020 1557 by Diannia Ruder, RN  Outcome: Discharged to Home  11/15/2020 0952 by Diannia Ruder, RN  Outcome: Ongoing - Unchanged     Problem: Respiratory Compromise (Heart Failure)  Goal: Effective Oxygenation and Ventilation  11/15/2020 1557 by Diannia Ruder, RN  Outcome: Discharged to Home  11/15/2020 0952 by Diannia Ruder, RN  Outcome: Ongoing - Unchanged     Problem: Sleep Disordered Breathing (Heart Failure)  Goal: Effective Breathing Pattern During Sleep  11/15/2020 1557 by Diannia Ruder, RN  Outcome: Discharged to Home  11/15/2020 [phone] by Diannia Ruder, RN  Outcome: Ongoing - Unchanged     Problem: Skin Injury Risk Increased  Goal: Skin Health and Integrity  11/15/2020 1557 by Diannia Ruder, RN  Outcome: Discharged to Home  11/15/2020 [phone] by Diannia Ruder, RN  Outcome: Ongoing - Unchanged  Intervention: Optimize Skin Protection  Recent Flowsheet Documentation  Taken 11/15/2020 1400 by Diannia Ruder, RN  Pressure Reduction Techniques: frequent weight shift encouraged  Taken 11/15/2020 1200 by Diannia Ruder, RN  Pressure Reduction Techniques: frequent weight shift encouraged  Taken 11/15/2020 1000 by Diannia Ruder, RN  Pressure Reduction Techniques: frequent weight shift encouraged  Taken 11/15/2020 0800 by Diannia Ruder, RN  Pressure Reduction Techniques: frequent weight shift encouraged     Discharge orders were entered for pt.  AVS reviewed and patient verbalized understanding with no additional questions. The pt's PIV  was removed without complications.  Pt escorted off unit via wheelchair with belongings by family member.

## 2020-11-15 NOTE — Unmapped (Addendum)
Home health has been set up for through the agency listed below. The Home health agency will be contacting you to set up a time for them to come see you in your home within 2 days of your discharge.  If you have not heard from them prior to 11/17/20 or you have any questions about home health, please contact them at the phone number listed below.    Durable Medical Equipment - Admitted Since 11/12/2020     Service Provider Selected Services Address Phone Fax Patient Preferred    [facility]  Durable Medical Equipment [address] [place], [place] [address] [phone] --      [facility] - Admitted Since 11/12/2020     Service Provider Selected Services Address Phone Fax Patient Preferred    [facility] - [facility] [address] [place] [address] [phone] --

## 2020-11-15 NOTE — Unmapped (Signed)
Physician Discharge Summary HBR  3 BT1 HBR  [address]  [place] [address]  Dept: [phone]  Loc: [phone]     Identifying Information:   KORBIN NOTARO  09/17/1950  [id number]    Primary Care Physician: Othelia Pulling, MD   Code Status: Full Code    Admit Date: 11/12/2020    Discharge Date: 11/15/2020     Discharge To: Home    Discharge Service: HBR - [facility]     Discharge Attending Physician: No att. providers found    Discharge Diagnoses:  Principal Problem:    COPD with acute exacerbation (CMS-HCC)  Active Problems:    Sleep apnea - does not use CPAP - has 2LO2 at night    Essential hypertension    Tobacco use disorder    Moderate COPD - sees PULMONOLOGY  (RAF-HCC)  Resolved Problems:    * No resolved hospital problems. *      Outpatient Provider Follow Up Issues:   - Chronic pain management, especially lower extremity muscle cramping   - Management of anxiety, consider adding SSRI   - Repeat PFTs for COPD severity classification   - Incidental pulmonary nodules, repeat CT Chest in 6 months   - Follow up with Pulmonary to discuss end-stage COPD     Hospital Course:   Zurii Hewes is a 70 year-old female with a past medical history notable for history of seizure disorder, tobacco use disorder, GERD, anxiety/depression/bipolar disorder, hypertension, hyperthyroidism, borderline pulmonary hypertension, and asthma/COPD on 2L of O2 who presented with worsening dyspnea, wheezing, and lower extremity edema for the past few months concerning for worsening of COPD.     # Acute Hypoxic Respiratory Failure in the Setting of COPD on Home O2:  Presentation was concerning for COPD exacerbation versus progression of COPD and possible volume overload. Negative RSV/Flu/COVID as viral etiology for an exacerbation. CT Chest was negative for acute pulmonary pathology including idiopathic pulmonary fibrosis. Unlikely volume overload given normal BNP. POCUS revealed Kerley B lines, but limited Echo without signs of volume overload. Received diuresis with Lasix. No antibiotics were given, but was started on a 5 day course of prednisone. She remained on her baseline O2 of 2L. As DuoNebs were not helping, resumed her home inhalers and switched to Atrovent breathing treatments. Daily labs were obtained and were unremarkable. She will need repeat PFTs as outpatient for severity classification.     Due to patient's deconditioning, she was evaluated by PT and OT who recommended home health services, a wheelchair, and a beside commode. Patient declined home health services at this time, but may reconsider. A bedside commode was appropriate as the patient is confined to one room in her home currently. Furthermore, the patient has mobility limitations that affect her ability to perform ADLs and iADLS, has not be able to use a cane or walker, she requested a wheelchair, and she has adequate space in home to use a wheelchair.      # Incidental Finding - New Pulmonary Nodules: CT Chest with bilateral subcentimeter pulmonary nodules, centrilobular emphysema, and old granulomatous disease. Follows with [facility]. Will need repeat CT Chest in 6 months for surveillance of pulmonary nodules.     # HTN: Continued home hydrochlorothiazide 12.5 mg daily and losartan 100 mg daily. Well controlled blood pressure during hospitalization.      # HLD: Home regimen is rosuvastatin 10mg  nightly. Continued formulary substitution of atorvastatin 20 mg daily. Unlikely the cause of muscle  spasms (see below).      # Hyperthyroidism: Follows with [facility] (last seen 10/16/20). Continued methimazole 2.5mg  daily.     # IBS: Patient usually takes Bentyl and Linzess, but unfortunately Linzess is not on formulary. As such, continued home dicyclomine 20 mg daily. Provided as needed Miralax.      # GERD: Home regimen includes cimetidine 400mg , dexlansoprazole 60mg  daily, and sucralfate 1g. Continued as formulary substitution of famotidine 10 mg daily and pantoprazole 40 mg daily as well as sucralfate 1g QID     # Chronic Pain with Muscle Spasms: Home regimen includes gabapentin and tramadol. Patient reported muscle spasms in toes, feet, intercostal muscles, and diaphragm daily for a few months. Electrolytes were in the normal range and normal TSH. Continued gabapentin 600 mg nightly. Trial of methocarbamol as well as lidocaine patches and Voltaren Gel with some relief. Ultimately, reordered Tramadol to assist in alleviating chronic pain and soothing muscle spasms. Unclear if muscle spasms are related to statin therapy. Patient should discuss with PCP for better pain management plan and possible alternate therapy.      # Anxiety & Depression: Continued home buproprion 300mg  daily. Patient had a few episodes of anxiety during her admission and required hydroxyzine which helped alleviate anxiety. PCP should discuss further management like adding an SSRI.      COVID-19 Vaccination: Up to date    Touchbase with Outpatient Provider:  Warm Handoff: Completed on 11/15/20 by Landry Dyke  (Intern) via [facility] Message    Procedures:  None  No admission procedures for hospital encounter.  ______________________________________________________________________  Discharge Medications:     Your Medication List      STOP taking these medications    meclizine 12.5 mg tablet  Commonly known as: ANTIVERT     ondansetron 4 MG disintegrating tablet  Commonly known as: ZOFRAN-ODT     rosuvastatin 10 MG tablet  Commonly known as: CRESTOR        START taking these medications    predniSONE 20 MG tablet  Commonly known as: DELTASONE  Take 2 tablets (40 mg total) by mouth in the morning.  Start taking on: November 16, 2020        CONTINUE taking these medications    buPROPion 300 MG 24 hr tablet  Commonly known as: WELLBUTRIN XL  Take 300 mg by mouth every morning.     busPIRone 5 MG tablet  Commonly known as: BUSPAR  Take 5 mg by mouth two (2) times a day.     cetirizine 10 MG tablet  Commonly known as: ZyrTEC  Take 1 tablet by mouth daily.     cholecalciferol (vitamin D3-25 mcg (1,000 unit)) 25 mcg (1,000 unit) capsule  Take 2,000 Units by mouth daily.     cimetidine 400 MG tablet  Commonly known as: TAGAMET  Take 400 mg by mouth daily.     clobetasoL 0.05 % cream  Commonly known as: TEMOVATE  Apply twice a day to affected areas of body.  Avoid face and folds     dexlansoprazole 60 mg capsule  Take 60 mg by mouth daily.     dicyclomine 20 mg tablet  Commonly known as: BENTYL  Take 20 mg by mouth Three (3) times a day. ONLY TAKING 2 A DAY     dupilumab 300 mg/2 mL Pnij  Inject the contents of one pen (300 mg) under the skin every two weeks as maintenance.     DUPIXENT PEN 300 mg/2 mL Pnij  Generic drug: dupilumab  Inject the contents of 2 pens (600 mg) under the skin once for loading.     empty container Misc  Use as directed to dispose of Dupixent pens.     fluticasone furoate-vilanteroL 100-25 mcg/dose inhaler  Commonly known as: BREO ELLIPTA  Inhale 1 puff daily.     gabapentin 300 MG capsule  Commonly known as: NEURONTIN  Take 1 capsule by mouth twice daily and take 2 capsules every night at bedtime.     hydroCHLOROthiazide 12.5 MG tablet  Commonly known as: HYDRODIURIL  Take 12.5 mg by mouth daily.     levalbuterol 0.63 mg/3 mL nebulizer solution  Commonly known as: XOPENEX  Inhale 1 ampule by nebulization every four (4) hours as needed for wheezing.     linaCLOtide 145 mcg capsule  Commonly known as: LINZESS  Take 145 mcg by mouth.     losartan 100 MG tablet  Commonly known as: COZAAR  TAKE 1 TABLET BY MOUTH ONCE DAILY     methIMAzole 5 MG tablet  Commonly known as: TAPAZOLE  Take 2.5 mg by mouth daily.     polyethylene glycol 17 gram/dose powder  Commonly known as: GLYCOLAX  17 gm (one capful) with 8 ounces of liquid     SPIRIVA WITH HANDIHALER 18 mcg inhalation capsule  Generic drug: tiotropium  PLACE 1 CAPSULE IN INHALER AND INHALE ONCE DAILY     sucralfate 1 gram tablet  Commonly known as: CARAFATE  Take 1 g by mouth Four (4) times a day.     traMADoL 50 mg tablet  Commonly known as: ULTRAM  Take 50 mg by mouth as needed in the morning.     triamcinolone 0.1 % cream  Commonly known as: KENALOG  APPLY TO AFFECTED AREA ON BACK TWICE DAILY FOR 2 WEEKS OR UNTIL RESOLVED     albuterol 2.5 mg /3 mL (0.083 %) nebulizer solution  Inhale 2.5 mg by nebulization every four (4) hours as needed for wheezing.     VENTOLIN HFA 90 mcg/actuation inhaler  Generic drug: albuterol  Inhale 2 puffs every four (4) hours as needed.     VITAMIN B-12 ORAL  Take by mouth daily.            Allergies:  Nsaids (non-steroidal anti-inflammatory drug), Promethazine hcl, Trimethoprim, Aspirin, Lipitor [atorvastatin], Sulfa (sulfonamide antibiotics), and Sulfamethoxazole  ______________________________________________________________________  Pending Test Results (if blank, then none):      Most Recent Labs:  All lab results last 24 hours -   Recent Results (from the past 24 hour(s))   CBC    Collection Time: 11/15/20  7:25 AM   Result Value Ref Range    WBC 13.4 (H) 3.6 - 11.2 10*9/L    RBC 4.81 3.95 - 5.13 10*12/L    HGB 13.6 11.3 - 14.9 g/dL    HCT 16.1 09.6 - 04.5 %    MCV 88.3 77.6 - 95.7 fL    MCH 28.3 25.9 - 32.4 pg    MCHC 32.0 32.0 - 36.0 g/dL    RDW 40.9 81.1 - 91.4 %    MPV 8.8 6.8 - 10.7 fL    Platelet 223 150 - 450 10*9/L   Basic Metabolic Panel    Collection Time: 11/15/20  7:25 AM   Result Value Ref Range    Sodium 138 135 - 145 mmol/L    Potassium 3.6 3.4 - 4.8 mmol/L    Chloride 102 98 - 107 mmol/L  CO2 30.6 20.0 - 31.0 mmol/L    Anion Gap 5 5 - 14 mmol/L    BUN 23 9 - 23 mg/dL    Creatinine 1.61 (H) 0.60 - 0.80 mg/dL    BUN/Creatinine Ratio 19     eGFR CKD-EPI (2021) Female 48 (L) >=60 mL/min/1.12m2    Glucose 81 70 - 179 mg/dL    Calcium 9.5 8.7 - 09.6 mg/dL       Relevant Studies/Radiology (if blank, then none):  ECG 12 Lead    Result Date: 11/13/2020  NORMAL SINUS RHYTHM NORMAL ECG WHEN COMPARED WITH ECG OF 13-Nov-2020 00:48, NO SIGNIFICANT CHANGE WAS FOUND Confirmed by Rose-jones, Lisa ([id number]) on 11/13/2020 7:12:54 AM    ECG 12 Lead    Result Date: 11/13/2020  NORMAL SINUS RHYTHM NORMAL ECG WHEN COMPARED WITH ECG OF 12-Nov-2020 23:33, NO SIGNIFICANT CHANGE WAS FOUND Confirmed by Rose-jones, Lisa ([id number]) on 11/13/2020 7:12:00 AM    ECG 12 Lead    Result Date: 11/13/2020  NORMAL SINUS RHYTHM NORMAL ECG WHEN COMPARED WITH ECG OF 30-Oct-2020 11:00, NO SIGNIFICANT CHANGE WAS FOUND Confirmed by Freeman Caldron ([id number]) on 11/13/2020 7:10:56 AM    XR Chest Portable    Result Date: 11/13/2020  EXAM: XR CHEST PORTABLE DATE: 11/12/2020 11:02 PM ACCESSION: [id number] UN DICTATED: 11/12/2020 11:09 PM INTERPRETATION LOCATION: [facility] CLINICAL INDICATION: 70 year old female with dyspnea.  COMPARISON: 10/30/2020 chest radiograph. TECHNIQUE: Portable Chest Radiograph. FINDINGS: The lungs are clear. No pleural effusion or pneumothorax. Unremarkable cardiomediastinal silhouette.     No acute findings.    CT Chest Wo Contrast    Result Date: 11/13/2020  EXAM: CT CHEST WO CONTRAST DATE: 11/13/2020 4:20 PM ACCESSION: [id number] UN DICTATED: 11/13/2020 4:28 PM INTERPRETATION LOCATION: [facility] CLINICAL INDICATION: 70 years old Female with worsening dyspnea, hypoxia  COMPARISON: None TECHNIQUE: Contiguous 0.6 and 3mm axial images were reconstructed through the chest following a single breath hold helical acquisition.  Images were reformatted in the axial and sagittal planes. MIP slabs were also constructed. FINDINGS: MEDICAL DEVICES: None. LUNGS AND AIRWAYS: Lungs are hyperinflated with advanced centrilobular emphysema. Linear opacities in both lower lobes may represent subsegmental atelectasis or scarring. Calcified right lower lobe nodules consistent with benign granulomas. Scattered bilateral noncalcified subcentimeter pulmonary nodules, including solid spiculated 6 mm average diameter right upper lobe nodule on axial image 157 of series 3 and solid 5 mm left lower lobe nodule on axial image 3/4 of series 3. Central airways are patent. PLEURA AND DIAPHRAGM: No pleural effusion or pneumothorax. Normal position of the diaphragm. MEDIASTINUM AND HILA: No enlarged intrathoracic lymph nodes. Mild distention of the thoracic esophagus. HEART AND VASCULATURE: Heart is normal in size. Moderate coronary artery calcifications. No pericardial effusion. Normal caliber aorta with moderate calcifications. Main pulmonary artery is enlarged. Main pulmonary artery measures 3.6 cm. Main pulmonary artery to ascending aorta diameter ratio is elevated at 1.1. CHEST WALL AND BONES: No chest wall abnormality. Mild degenerative changes of the thoracic spine. LOWER NECK: No enlarged cervical lymph nodes. Normal visualized thyroid gland. UPPER ABDOMEN: Imaged upper abdomen is normal. Surgical clips in the gallbladder fossa consistent with prior cholecystectomy. OTHER: No other significant findings.     *Bilateral subcentimeter pulmonary nodules, including solid 5 mm left lower lobe nodule and spiculated 6 mm right upper lobe nodule. Follow-up noncontrast CT chest is recommended in 6 months per Fleischner guidelines. *Pulmonary hyperinflation with advanced centrilobular emphysema. *Old granulomatous disease. *Central pulmonary artery enlargement with elevated main pulmonary artery to ascending aorta  diameter ratio which can be seen with pulmonary hypertension. *Moderate aortic and coronary artery atherosclerosis.     Echocardiogram Follow Up/Limited Echo With Contrast    Result Date: 11/14/2020  Patient Info Name:     AMORY ZBIKOWSKI Age:     57 years DOB:     03-27-1951 Gender:     Female MRN:     [id number] Accession #:     [id number] UN Ht:     160 cm Wt:     69 kg BSA:     1.77 m2 HR:     98 bpm BP:     135 /     57 mmHg Technical Quality:     Poor Exam Date:     11/14/2020 9:02 AM Site Location:     [facility] Exam Location:     [facility] Admit Date: 11/12/2020 Exam Type:     ECHOCARDIOGRAM FOLLOW UP/LIMITED ECHO WITH CONTRAST Study Info Indications      - follow up systolic function,  PA pressure Limited 2D, color flow and Doppler transthoracic echocardiogram is performed. Staff Referring Physician:     [phone], Kizzie Bane; Sonographer:     Candis Schatz Ordering Physician:     Benna Dunks Account #:     [id number] Contrast/Agitated Saline ------------------------------ Contrast/Ag. Saline:     Definity Amount:     1.00 ml Reason for Poor Study:     poor echocardiographic windows Summary   1. Technically difficult study.   2. The left ventricle is normal in size with upper normal wall thickness.   3. The left ventricular systolic function is hyperdynamic, LVEF is visually estimated at 70%.   4. The mitral valve leaflets are mildly thickened with normal leaflet mobility.   5. The right ventricle is normal in size, with normal systolic function.   6. There is no pulmonary hypertension, estimated pulmonary artery systolic pressure is 32 mmHg. Left Ventricle   The left ventricle is normal in size with upper normal wall thickness.   The left ventricular systolic function is hyperdynamic, LVEF is visually estimated at 70%.   There is normal left ventricular diastolic function.   Provocable intracavitary gradient with peak instantaneous gradient of 60 mmHg with valsalva likely secondary to hypercontractile state. Right Ventricle   The right ventricle is normal in size, with normal systolic function. Left Atrium   The left atrium is normal in size. Right Atrium   The right atrium is normal  in size. Aortic Valve   The aortic valve is probably trileaflet with normal appearing leaflets with normal excursion.   There is no significant aortic regurgitation.   There is no evidence of a significant transvalvular gradient. Pulmonic Valve   The pulmonic valve is poorly visualized, but probably normal.   There is no significant pulmonic regurgitation.   There is no evidence of a significant transvalvular gradient. Mitral Valve   The mitral valve leaflets are mildly thickened with normal leaflet mobility.   There is no significant mitral valve regurgitation. Tricuspid Valve   The tricuspid valve leaflets are normal, with normal leaflet mobility.   There is trivial tricuspid regurgitation.   There is no pulmonary hypertension, estimated pulmonary artery systolic pressure is 32 mmHg. Other Findings   Rhythm: Sinus Rhythm. Pericardium/Pleural   There is no pericardial effusion. Inferior Vena Cava   IVC size and inspiratory change suggest normal right atrial pressure. (0-5 mmHg). Aorta   The aorta is normal in size in the visualized segments. Mitral Valve ----------------------------------------------------------------------  Name                                 Value        Normal ---------------------------------------------------------------------- MV Diastolic Function ---------------------------------------------------------------------- MV E Peak Velocity                 77 cm/s               MV Annular TDI ---------------------------------------------------------------------- MV Septal e' Velocity             8.4 cm/s         >=8.0 MV Lateral e' Velocity            8.7 cm/s        >=10.0 MV e' Average                          8.5               MV E/e' (Average)                      9.0 Tricuspid Valve ---------------------------------------------------------------------- Name                                 Value        Normal ---------------------------------------------------------------------- TV Regurgitation Doppler ---------------------------------------------------------------------- TR Peak Velocity                     3 m/s               Estimated PAP/RSVP ---------------------------------------------------------------------- RA Pressure                         3 mmHg           <=5 RV Systolic Pressure               32 mmHg           <36 Ventricles ---------------------------------------------------------------------- Name                                 Value        Normal ---------------------------------------------------------------------- LV Dimensions 2D/MM ---------------------------------------------------------------------- IVS Diastolic Thickness (2D)        0.9 cm       0.6-0.9 LVID Diastole (2D)                  3.0 cm       3.8-5.2 LVPW Diastolic Thickness (2D)                                0.9 cm       0.6-0.9 LVID Systole (2D)                   2.1 cm       2.2-3.5 Report Signatures Finalized by Joneen Roach  MD on 11/14/2020 03:53 PM Resident Dr. Juliane Lack  MD on 11/14/2020 01:43 PM    ______________________________________________________________________  Discharge Instructions:   Activity Instructions     Activity as tolerated                Other Instructions  Call MD for:  difficulty breathing, headache or visual disturbances      Call MD for:  persistent nausea or vomiting      Call MD for:  severe uncontrolled pain      Call MD for: Temperature > 38.5 Celsius ( > 101.3 Fahrenheit)      Discharge instructions      Discharge Instructions for Chronic Obstructive Pulmonary Disease (COPD)    You have been diagnosed with chronic obstructive pulmonary disease (COPD). This is a name given to a group of diseases that limit the flow of air in and out of your lungs, making it harder to breathe. With COPD, you are also more likely to get lung infections. COPD includes chronic bronchitis and emphysema, which are most often caused by heavy, long-term cigarette smoking.      Home Care        Break the smoking habit.          Enroll in a stop-smoking program to increase your chances of success.          Ask your doctor about medications or other methods to help you quit.          Ask family members to quit smoking as well.          Don't allow people to smoke in your home or when they are around you.      Protect yourself from infection.          Wash your hands often. Do your best to keep your hands away from your face. Most germs are spread from your hands to your mouth.          Get a flu shot every year. Ask your doctor about a pneumonia vaccination.          Avoid crowds. It's especially important to do this in the winter when more people have colds and flu.          Get enough sleep, exercise regularly, and eat a balanced diet.          Learn postural drainage and percussion, techniques that can help you cough up extra mucus. This extra mucus can trap germs in your lungs.      Take your medications exactly as directed. Don't skip doses.      Stress can make COPD worse. Use these stress-management techniques:          Find a quiet place and sit or lie in a comfortable position.          Close your eyes and perform breathing exercises for several minutes.      Call your doctor immediately if you have any of the following:        Shortness of breath, wheezing, or coughing      Increased mucus      Yellow, green, bloody, or smelly mucus      Fever or chills      Tightness in your chest that does not go away with rest or medication      An irregular heartbeat      Swollen ankles    No dressing needed            Resources and Referrals     Commode      Length of Need: 99 months    Type of commode: Patient Equipment Comment - 3 in 1 BSC    Please provide 3 in 1 BSC appropriate to patient's height and weight.  Height: 160 cm (5' 3)     Weight: 74.8 kg (165 lb)         Please provide 3 in 1 BSC appropriate to patient's height and weight.  Height: 160 cm (5' 3)     Weight: 74.8 kg (165 lb)    Wheelchair      Type: Standard    Seat: 18x16    Cushion: Foam    Footrests: Swing away    Length of Need: 99 months    Height: 160 cm (5' 3)     Weight: 73.7 kg (162 lb 8 oz)         Height: 160 cm (5' 3)     Weight: 73.7 kg (162 lb 8 oz)        Home health has been set up for through the agency listed below. The Home health agency will be contacting you to set up a time for them to come see you in your home within 2 days of your discharge.  If you have not heard from them prior to 11/17/20 or you have any questions about home health, please contact them at the phone number listed below.    Durable Medical Equipment - Admitted Since 11/12/2020     Service Provider Selected Services Address Phone Fax Patient Preferred    [facility] - DME  Durable Medical Equipment [address], [address], [place] [address] [phone] --      [facility] - Admitted Since 11/12/2020     Service Provider Selected Services Address Phone Fax Patient Preferred    [facility] - [facility] [address] Janee Morn [place] [address] [phone] --                        Follow Up instructions and Outpatient Referrals     Ambulatory referral to Home Health      Is this a [facility] or [facility] Patient?: Yes    Home Health Options: Traditional Home Health    Is this patient at high risk for COVID 19 transmission and recommended to   stay at home during this pandemic?: No    If the patient has a diagnosis of heart failure and is already on oral   diuretics, do you want to activate the in home IV Lasix protocol?: No    Do you want agency provider parameter notifications or patient specific   provider parameter notifications?: Agency    Do you want to initiate remote patient monitoring?: No    Physician to follow patient's care: Referring Provider    Disciplines requested:  Physical Therapy  Occupational Therapy       Physical Therapy requested: Evaluate and treat    Occupational Therapy Requested: Evaluate and treat    Requested SOC Date:  Comment - within 48 hours    Ambulatory referral to Home Health      Is this a [facility] or [facility] Patient?: Yes    Home Health Options: Traditional Home Health    Is this patient at high risk for COVID 19 transmission and recommended to   stay at home during this pandemic?: No If the patient has a diagnosis of heart failure and is already on oral   diuretics, do you want to activate the in home IV Lasix protocol?: No    Do you want agency provider parameter notifications  or patient specific   provider parameter notifications?: Agency    Do you want to initiate remote patient monitoring?: No    Physician to follow patient's care: Referring Provider    Disciplines requested: Medical Social Work    Medical Social Work requested: Other (please enter in comments) Comment -   community resources    Requested [facility] Date:  Comment - within 48 hours    Do you want ongoing co-management?: No    Call MD for:  difficulty breathing, headache or visual disturbances      Call MD for:  persistent nausea or vomiting      Call MD for:  severe uncontrolled pain      Call MD for: Temperature > 38.5 Celsius ( > 101.3 Fahrenheit)      Discharge instructions          Appointments which have been scheduled for you    Nov 17, 2020 To Be Determined  PT - OASIS START OF CARE with Duanne Moron, PT  [facility]) [address]  [address]  [place] [address]  [phone]        Nov 21, 2020  2:00 PM  (Arrive by 1:45 PM)  OFFICE VISIT with Marca Ancona, MD  [facility]) [address] [place] [address]  [phone]        Nov 24, 2020 To Be Determined  OT - INITIAL EVALUATION with Graciella Freer, OT  [facility]) [address]  [address]  [place] [address]  [phone]        May 14, 2021  1:20 PM  (Arrive by 1:05 PM)  NEW GENERAL SPECIALIST with Bartholomew Boards, MD  [facility] REGION) [address]  [place] [address]  [phone]             ______________________________________________________________________  Discharge Day Services:  BP 119/65  - Pulse 80  - Temp 36.3 ??C (Temporal)  - Resp 20  - Ht 160 cm (5' 3)  - Wt 74.8 kg (165 lb)  - SpO2 93%  - BMI 29.23 kg/m??   Pt seen on the day of discharge and determined appropriate for discharge.    GEN: chronically ill-appearing, but non-toxic. Appears older than stated age. Resting in bed.   HEENT: NCAT. MMM. EOMI.  Neck: Supple. No lymphadenopathy.   CV: Regular rate and rhythm. No murmurs.   Pulm: Normal work of breathing on 2L of O2 via [place]. Diffuse minimal wheezing, but good air movement.   Abd: Soft, nontender. No guarding, rebound.  Normoactive bowel sounds.    Neuro: A&O x 3. No focal deficits.   Ext: No peripheral edema.  Palpable distal pulses.    Condition at Discharge: good    Length of Discharge: I spent less than 30 mins in the discharge of this patient.

## 2020-11-15 NOTE — Unmapped (Addendum)
Lisa Andrews is a 70 year-old female with a past medical history notable for history of seizure disorder, tobacco use disorder, GERD, anxiety/depression/bipolar disorder, hypertension, hyperthyroidism, borderline pulmonary hypertension, and asthma/COPD on 2L of O2 who presented with worsening dyspnea, wheezing, and lower extremity edema for the past few months concerning for worsening of COPD.     # Acute Hypoxic Respiratory Failure in the Setting of COPD on Home O2:  Presentation was concerning for COPD exacerbation versus progression of COPD and possible volume overload. Negative RSV/Flu/COVID as viral etiology for an exacerbation. CT Chest was negative for acute pulmonary pathology including idiopathic pulmonary fibrosis. Unlikely volume overload given normal BNP. POCUS revealed Kerley B lines, but limited Echo without signs of volume overload. Received diuresis with Lasix. No antibiotics were given, but was started on a 5 day course of prednisone. She remained on her baseline O2 of 2L. As DuoNebs were not helping, resumed her home inhalers and switched to Atrovent breathing treatments. Daily labs were obtained and were unremarkable. She will need repeat PFTs as outpatient for severity classification.     Due to patient's deconditioning, she was evaluated by PT and OT who recommended home health services, a wheelchair, and a beside commode. Patient declined home health services at this time, but may reconsider. A bedside commode was appropriate as the patient is confined to one room in her home currently. Furthermore, the patient has mobility limitations that affect her ability to perform ADLs and iADLS, has not be able to use a cane or walker, she requested a wheelchair, and she has adequate space in home to use a wheelchair.      # Incidental Finding - New Pulmonary Nodules: CT Chest with bilateral subcentimeter pulmonary nodules, centrilobular emphysema, and old granulomatous disease. Follows with [facility]. Will need repeat CT Chest in 6 months for surveillance of pulmonary nodules.     # HTN: Continued home hydrochlorothiazide 12.5 mg daily and losartan 100 mg daily. Well controlled blood pressure during hospitalization.      # HLD: Home regimen is rosuvastatin 10mg  nightly. Continued formulary substitution of atorvastatin 20 mg daily. Unlikely the cause of muscle spasms (see below).      # Hyperthyroidism: Follows with [facility] (last seen 10/16/20). Continued methimazole 2.5mg  daily.     # IBS: Patient usually takes Bentyl and Linzess, but unfortunately Linzess is not on formulary. As such, continued home dicyclomine 20 mg daily. Provided as needed Miralax.      # GERD: Home regimen includes cimetidine 400mg , dexlansoprazole 60mg  daily, and sucralfate 1g. Continued as formulary substitution of famotidine 10 mg daily and pantoprazole 40 mg daily as well as sucralfate 1g QID     # Chronic Pain with Muscle Spasms: Home regimen includes gabapentin and tramadol. Patient reported muscle spasms in toes, feet, intercostal muscles, and diaphragm daily for a few months. Electrolytes were in the normal range and normal TSH. Continued gabapentin 600 mg nightly. Trial of methocarbamol as well as lidocaine patches and Voltaren Gel with some relief. Ultimately, reordered Tramadol to assist in alleviating chronic pain and soothing muscle spasms. Unclear if muscle spasms are related to statin therapy. Patient should discuss with PCP for better pain management plan and possible alternate therapy.      # Anxiety & Depression: Continued home buproprion 300mg  daily. Patient had a few episodes of anxiety during her admission and required hydroxyzine which helped alleviate anxiety. PCP should discuss further management like adding an SSRI.

## 2020-11-15 NOTE — Unmapped (Signed)
Pt. Complaining of severe muscle spasms. Robaxin administered as ordered and was effective. Pt. Using 2 L of O2 and pulsox is WNL. No calls from telemetry. Alert and oriented x 4.  PIV flushed and maintained. All safety measures maintained. Will continue to monitor.      Problem: Adult Inpatient Plan of Care  Goal: Plan of Care Review  Outcome: Progressing  Goal: Patient-Specific Goal (Individualized)  Outcome: Progressing  Goal: Absence of Hospital-Acquired Illness or Injury  Outcome: Progressing  Intervention: Identify and Manage Fall Risk  Recent Flowsheet Documentation  Taken 11/15/2020 0600 by Rhina Brackett, RN  Safety Interventions:   fall reduction program maintained   low bed  Taken 11/15/2020 0400 by Rhina Brackett, RN  Safety Interventions:   fall reduction program maintained   low bed  Taken 11/15/2020 0200 by Rhina Brackett, RN  Safety Interventions:   fall reduction program maintained   low bed  Taken 11/15/2020 0000 by Rhina Brackett, RN  Safety Interventions:   fall reduction program maintained   low bed  Taken 11/14/2020 2200 by Rhina Brackett, RN  Safety Interventions:   fall reduction program maintained   low bed  Taken 11/14/2020 2000 by Rhina Brackett, RN  Safety Interventions:   fall reduction program maintained   low bed  Goal: Optimal Comfort and Wellbeing  Outcome: Progressing  Goal: Readiness for Transition of Care  Outcome: Progressing  Goal: Rounds/Family Conference  Outcome: Progressing     Problem: Self-Care Deficit  Goal: Improved Ability to Complete Activities of Daily Living  Outcome: Progressing     Problem: Fall Injury Risk  Goal: Absence of Fall and Fall-Related Injury  Outcome: Progressing  Intervention: Promote Injury-Free Environment  Recent Flowsheet Documentation  Taken 11/15/2020 0600 by Rhina Brackett, RN  Safety Interventions:   fall reduction program maintained   low bed  Taken 11/15/2020 0400 by Rhina Brackett, RN  Safety Interventions:   fall reduction program maintained   low bed  Taken 11/15/2020 0200 by Rhina Brackett, RN  Safety Interventions:   fall reduction program maintained   low bed  Taken 11/15/2020 0000 by Rhina Brackett, RN  Safety Interventions:   fall reduction program maintained   low bed  Taken 11/14/2020 2200 by Rhina Brackett, RN  Safety Interventions:   fall reduction program maintained   low bed  Taken 11/14/2020 2000 by Rhina Brackett, RN  Safety Interventions:   fall reduction program maintained   low bed     Problem: Asthma Comorbidity  Goal: Maintenance of Asthma Control  Outcome: Progressing     Problem: COPD (Chronic Obstructive Pulmonary Disease) Comorbidity  Goal: Maintenance of COPD Symptom Control  Outcome: Progressing     Problem: Diabetes Comorbidity  Goal: Blood Glucose Level Within Targeted Range  Outcome: Progressing     Problem: Hypertension Comorbidity  Goal: Blood Pressure in Desired Range  Outcome: Progressing     Problem: Adjustment to Illness (Heart Failure)  Goal: Optimal Coping  Outcome: Progressing     Problem: Cardiac Output Decreased (Heart Failure)  Goal: Optimal Cardiac Output  Outcome: Progressing     Problem: Dysrhythmia (Heart Failure)  Goal: Stable Heart Rate and Rhythm  Outcome: Progressing     Problem: Fluid Imbalance (Heart Failure)  Goal: Fluid Balance  Outcome: Progressing     Problem: Functional Ability Impaired (Heart Failure)  Goal: Optimal Functional Ability  Outcome: Progressing     Problem: Oral Intake Inadequate (  Heart Failure)  Goal: Optimal Nutrition Intake  Outcome: Progressing     Problem: Respiratory Compromise (Heart Failure)  Goal: Effective Oxygenation and Ventilation  Outcome: Progressing

## 2020-11-16 MED ORDER — PREDNISONE 20 MG TABLET
ORAL_TABLET | Freq: Every day | ORAL | 0 refills | 1 days | Status: CP
Start: 2020-11-16 — End: ?

## 2020-11-17 ENCOUNTER — Telehealth: Payer: Self-pay | Admitting: Internal Medicine

## 2020-11-17 NOTE — Unmapped (Signed)
Lisa Andrews was able to get her cousin to help her re-administer her loading doses. This went well, and she had no adverse reactions. She's had minimal changes in her skin so far, but we reviewed that it may take up to 8 weeks for full effect.      [facility] Clinical Assessment & Refill Coordination Note    Lisa Andrews, DOB: 03/07/1951  Phone: [phone] (home)     All above HIPAA information was verified with patient.     Was a Nurse, learning disability used for this call? No    Specialty Medication(s):   Inflammatory Disorders: Dupixent     Current Outpatient Medications   Medication Sig Dispense Refill   ??? albuterol (VENTOLIN HFA) 90 mcg/actuation inhaler Inhale 2 puffs every four (4) hours as needed.      ??? albuterol 2.5 mg /3 mL (0.083 %) nebulizer solution Inhale 2.5 mg by nebulization every four (4) hours as needed for wheezing.     ??? buPROPion (WELLBUTRIN XL) 300 MG 24 hr tablet Take 300 mg by mouth every morning.      ??? busPIRone (BUSPAR) 5 MG tablet Take 5 mg by mouth two (2) times a day.     ??? cetirizine (ZYRTEC) 10 MG tablet Take 1 tablet by mouth daily.     ??? cholecalciferol, vitamin D3, 1,000 unit capsule Take 2,000 Units by mouth daily.      ??? cimetidine (TAGAMET) 400 MG tablet Take 400 mg by mouth daily.     ??? clobetasoL (TEMOVATE) 0.05 % cream Apply twice a day to affected areas of body.  Avoid face and folds 60 g 6   ??? cyanocobalamin, vitamin B-12, (VITAMIN B-12 ORAL) Take by mouth daily.     ??? dexlansoprazole 60 mg capsule Take 60 mg by mouth daily. 90 capsule 3   ??? dicyclomine (BENTYL) 20 mg tablet Take 20 mg by mouth Three (3) times a day. ONLY TAKING 2 A DAY 20 tablet 1   ??? dupilumab (DUPIXENT PEN) 300 mg/2 mL PnIj Inject the contents of 2 pens (600 mg) under the skin once for loading. 4 mL 0   ??? dupilumab 300 mg/2 mL PnIj Inject the contents of one pen (300 mg) under the skin every two weeks as maintenance. 4 mL 11   ??? empty container Misc Use as directed to dispose of Dupixent pens. 1 each 2   ??? fluticasone furoate-vilanterol (BREO ELLIPTA) 100-25 mcg/dose inhaler Inhale 1 puff daily.     ??? gabapentin (NEURONTIN) 300 MG capsule Take 1 capsule by mouth twice daily and take 2 capsules every night at bedtime. 360 capsule 3   ??? hydroCHLOROthiazide (HYDRODIURIL) 12.5 MG tablet Take 12.5 mg by mouth daily.     ??? levalbuterol (XOPENEX) 0.63 mg/3 mL nebulizer solution Inhale 1 ampule by nebulization every four (4) hours as needed for wheezing.     ??? linaclotide (LINZESS) 145 mcg capsule Take 145 mcg by mouth.     ??? losartan (COZAAR) 100 MG tablet TAKE 1 TABLET BY MOUTH ONCE DAILY 90 tablet 1   ??? meTHIMazole (TAPAZOLE) 5 MG tablet Take 2.5 mg by mouth daily.      ??? polyethylene glycol (GLYCOLAX) 17 gram/dose powder 17 gm (one capful) with 8 ounces of liquid 510 g 12   ??? predniSONE (DELTASONE) 20 MG tablet Take 2 tablets (40 mg total) by mouth in the morning. 2 tablet 0   ??? sucralfate (CARAFATE) 1 gram tablet Take 1  g by mouth Four (4) times a day.     ??? tiotropium (SPIRIVA WITH HANDIHALER) 18 mcg inhalation capsule PLACE 1 CAPSULE IN INHALER AND INHALE ONCE DAILY     ??? traMADoL (ULTRAM) 50 mg tablet Take 50 mg by mouth as needed in the morning.     ??? triamcinolone (KENALOG) 0.1 % cream APPLY TO AFFECTED AREA ON BACK TWICE DAILY FOR 2 WEEKS OR UNTIL RESOLVED 454 g 2     No current facility-administered medications for this visit.        Changes to medications: Nikkie reports no changes at this time.    Allergies   Allergen Reactions   ??? Nsaids (Non-Steroidal Anti-Inflammatory Drug)      Pt with severe GERD/pud, med interaction   ??? Promethazine Hcl      Medication Interaction   ??? Trimethoprim      rash   ??? Aspirin Nausea And Vomiting     PUD and nausea with aspirin and NSAIDS   ??? Lipitor [Atorvastatin] Muscle Pain   ??? Sulfa (Sulfonamide Antibiotics) Rash   ??? Sulfamethoxazole Rash       Changes to allergies: No    SPECIALTY MEDICATION ADHERENCE     Dupixent - 0 left  Medication Adherence Patient reported X missed doses in the last month: 0  Specialty Medication: Dupixent          Specialty medication(s) dose(s) confirmed: Regimen is correct and unchanged.     Are there any concerns with adherence? No    Adherence counseling provided? Not needed    CLINICAL MANAGEMENT AND INTERVENTION      Clinical Benefit Assessment:    Do you feel the medicine is effective or helping your condition? No    Clinical Benefit counseling provided? Reasonable expectations discussed: May take ~8 weeks for full effect    Adverse Effects Assessment:    Are you experiencing any side effects? No    Are you experiencing difficulty administering your medicine? yes, but having cousin that works in health care help administer    Quality of Life Assessment:    How many days over the past month did your AD  keep you from your normal activities? For example, brushing your teeth or getting up in the morning. Patient declined to answer    Have you discussed this with your provider? Not needed    Acute Infection Status:    Acute infections noted within Epic:  No active infections  Patient reported infection: None    Therapy Appropriateness:    Is therapy appropriate? Yes, therapy is appropriate and should be continued    DISEASE/MEDICATION-SPECIFIC INFORMATION      For patients on injectable medications: Patient currently has 0 doses left.  Next injection is scheduled for 6/14.    PATIENT SPECIFIC NEEDS     - Does the patient have any physical, cognitive, or cultural barriers? No    - Is the patient high risk? No    - Does the patient require a Care Management Plan? No     - Does the patient require physician intervention or other additional services (i.e. nutrition, smoking cessation, social work)? No      SHIPPING     Specialty Medication(s) to be Shipped:   Inflammatory Disorders: Dupixent    Other medication(s) to be shipped: No additional medications requested for fill at this time     Changes to insurance: No    Delivery Scheduled: Yes, Expected medication delivery date: Monday, 6/13.  Medication will be delivered via Same Day Courier to the confirmed prescription address in [facility]    The patient will receive a drug information handout for each medication shipped and additional FDA Medication Guides as required.  Verified that patient has previously received a Conservation officer, historic buildings and a Surveyor, mining.    The patient or caregiver noted above participated in the development of this care plan and knows that they can request review of or adjustments to the care plan at any time.      All of the patient's questions and concerns have been addressed.    Lanney Gins   [facility] Specialty Pharmacist

## 2020-11-17 NOTE — Telephone Encounter (Signed)
She unfortunately needs to go back to the ED.  Her symptoms are very worrisome for possible pulmonary embolism, right-sided failure or other.

## 2020-11-17 NOTE — Telephone Encounter (Signed)
Patient is aware of below message/recommendations and voiced her understanding.  Pending OV 12/29/20. Nothing further needed.

## 2020-11-17 NOTE — Telephone Encounter (Addendum)
Spoke to patient, who reports of weakness,wheezing, dizziness with exertion and coughing, prod cough with yellow to white sputum, bilateral feet/ankle edema and increased sob.  Patient unable to complete sentence without stopping to catch her breathe.  She stated that she has been in the ED 4 since march and last admitted 11/12/2020. Completed course of prednisone today. Denied fever, chills sweats or additional sx. Sx are baseline since discharged. She wears 2L cont. Unable to monitor oxygen levels as she can not locate pulse ox.  Using albuterol neb 4-6x daily, spiriva once daily and breo once daily with no relief in sx.   Dr. Patsey Berthold, please advise. Dr. Mortimer Fries is unavailable

## 2020-11-20 MED FILL — DUPIXENT 300 MG/2 ML SUBCUTANEOUS PEN INJECTOR: SUBCUTANEOUS | 28 days supply | Qty: 4 | Fill #0

## 2020-11-21 ENCOUNTER — Encounter: Admit: 2020-11-21 | Discharge: 2020-11-22 | Payer: MEDICARE

## 2020-11-21 DIAGNOSIS — G4730 Sleep apnea, unspecified: Principal | ICD-10-CM

## 2020-11-21 DIAGNOSIS — R252 Cramp and spasm: Principal | ICD-10-CM

## 2020-11-21 DIAGNOSIS — F3130 Bipolar disorder, current episode depressed, mild or moderate severity, unspecified: Principal | ICD-10-CM

## 2020-11-21 DIAGNOSIS — J449 Chronic obstructive pulmonary disease, unspecified: Principal | ICD-10-CM

## 2020-11-21 DIAGNOSIS — Z720 Tobacco use: Principal | ICD-10-CM

## 2020-11-21 MED ORDER — NICOTINE 14 MG/24 HR DAILY TRANSDERMAL PATCH
MEDICATED_PATCH | TRANSDERMAL | 0 refills | 42 days | Status: CP
Start: 2020-11-21 — End: 2021-01-02

## 2020-11-21 NOTE — Unmapped (Signed)
Pt was seen in concert with [facility] Mr. Paulino Door who assisted with much of the notetaking.  I attest that I have reviewed and updated the note. The components of the history of the present illness, the physical exam, and the assessment and plan documented were performed by me or were performed in my presence by the student and verified by me.    Chief Complaint:  Chief Complaint   Patient presents with   ??? Depression   ??? COPD     Hospital follow up         History of Present Illness:  Lisa Andrews is a 70 y.o. female with past medical history as below presents today for evaluation/follow up of mood and COPD. Recently was in the hospital for COPD exacerbation -- workup not suggestive of ACS or PE. States that after breathing treatment in the hospital, she has had more persistent chest discomfort. Denied home health after discharge, as she thought she didn't qualify from previous discussions with other providers before admission. Now has a wheelchair. Has some difficulty sleeping, waking up after two hours of sleep from shortness of breath. Has to sleep while sitting upright. Will follow up with pulmonology Jul 22.     Continues to have severe anxiety and depression. Sees Dr. Daleen Squibb at [facility] but unsure when her next appointment is. Thinks lithium has helped her the most in the past but also had intolerable side effects. Does not believe she has previously been on SSRI/SNRI. Currently on buspirone and bupropion, though notes she only takes buspirone every once in a while. Continues to smoke about 1/2 ppd. Feels like she's dying and that no one in her family believes her. States she has a reason to be depressed. Moving to aunt's and cousin's house soon. Cousin helps her with dupilumab injections for eczema.     Has chronic pain with leg cramps. States that muscle relaxers helped her in the hospital. Symptoms mostly occur when sleeping.       Patient Care Team:  Marca Ancona, MD as PCP - General (Family Medicine)  Marca Ancona, MD as PCP - Rica Records, MD (Pulmonary Disease)  Hemang Wallis Mart, MD (Neurology)  Volanda Napoleon, MD (Otolaryngology)  Elnita Maxwell, MD  Kathrin Ruddy, MD  Edward Jolly, MD as Resident (Anesthesiology)    Past Medical History:   Diagnosis Date   ??? Anxiety and depression    ??? Arthritis    ??? Asthma    ??? At high risk for falls 04/03/2018   ??? Bipolar disorder (CMS-HCC)    ??? COPD bronchitis    ??? Eczema    ??? GERD (gastroesophageal reflux disease)    ??? Headache    ??? Hearing impairment    ??? Hypertension    ??? Impaired mobility    ??? Lung disease    ??? Memory deficit 02/27/2018   ??? Seizures (CMS-HCC)    ??? Thyroid disease     hypothyroid in the past, now euthyroid per patient.   ??? Urinary tract infection     resolved     Patient Active Problem List   Diagnosis   ??? Constipation   ??? Irritable colon   ??? Urinary tract infection SEES UROLOGY   ??? Vitamin B12 deficiency   ??? Seizure disorder (CMS-HCC)   ??? Tobacco use   ??? Sleep apnea - does not use CPAP - has 2LO2 at night   ??? Eczema   ???  Allergic rhinitis   ??? Essential hypertension   ??? GERD (gastroesophageal reflux disease)   ??? Bipolar affect, depressed - SEES PSYCHIATRY   ??? Hyperthyroidism   ??? Osteopenia of lumbar spine   ??? Asthma   ??? Foot pain, bilateral   ??? Dermatitis   ??? Disorder of bone and cartilage   ??? Tobacco use disorder   ??? Moderate COPD - sees PULMONOLOGY  (RAF-HCC)   ??? Vitamin B-complex deficiency   ??? Bell's palsy   ??? Arthritis   ??? Headache   ??? COPD with acute exacerbation (CMS-HCC)   ??? Post herpetic neuralgia   ??? Encounter for long-term current use of medication   ??? Splenic artery aneurysm (CMS-HCC)   ??? Family circumstance NOS   ??? Mixed hyperlipidemia   ??? Excessive tearing, right   ??? History of vertigo   ??? Noncompliance   ??? Prediabetes   ??? Phlebitis   ??? Onychomycosis   ??? Osteoarthritis   ??? Memory deficit   ??? COPD, moderate (CMS-HCC)   ??? At high risk for falls   ??? RLS (restless legs syndrome)   ??? Sciatica of right side   ??? Gastritis with intestinal metaplasia of stomach   ??? Adenomatous polyp of colon   ??? Chronic bilateral low back pain with bilateral sciatica   ??? Lumbar stenosis with neurogenic claudication     OB History     Gravida   3    Para   3    Term   3    Preterm        AB        Living   3       SAB        IAB        Ectopic        Molar        Multiple        Live Births                  Past Surgical History:   Procedure Laterality Date   ??? ABDOMINAL HERNIA REPAIR     ??? GALLBLADDER SURGERY     ??? HYSTERECTOMY      TAHBSO   ??? PR UPPER GI ENDOSCOPY,DIAGNOSIS N/A 09/28/2013    Procedure: UGI ENDO, INCLUDE ESOPHAGUS, STOMACH, & DUODENUM &/OR JEJUNUM; DX W/WO COLLECTION SPECIMN, BY BRUSH OR WASH;  Surgeon: Marygrace Drought, MD;  [facility]: [facility];  Service: Gastroenterology   ??? SKIN BIOPSY     ??? TOTAL ABDOMINAL HYSTERECTOMY W/ BILATERAL SALPINGOOPHORECTOMY         Allergies:  Nsaids (non-steroidal anti-inflammatory drug), Promethazine hcl, Trimethoprim, Aspirin, Lipitor [atorvastatin], Sulfa (sulfonamide antibiotics), and Sulfamethoxazole    Current Outpatient Medications   Medication Sig Dispense Refill   ??? albuterol 2.5 mg /3 mL (0.083 %) nebulizer solution Inhale 2.5 mg by nebulization every four (4) hours as needed for wheezing.     ??? albuterol HFA 90 mcg/actuation inhaler Inhale 2 puffs every four (4) hours as needed.      ??? buPROPion (WELLBUTRIN XL) 300 MG 24 hr tablet Take 300 mg by mouth every morning.      ??? busPIRone (BUSPAR) 5 MG tablet Take 5 mg by mouth two (2) times a day.     ??? cetirizine (ZYRTEC) 10 MG tablet Take 1 tablet by mouth daily.     ??? cholecalciferol, vitamin D3, 1,000 unit capsule Take 2,000 Units by mouth daily.      ???  cimetidine (TAGAMET) 400 MG tablet Take 400 mg by mouth daily.     ??? clobetasoL (TEMOVATE) 0.05 % cream Apply twice a day to affected areas of body.  Avoid face and folds 60 g 6   ??? cyanocobalamin, vitamin B-12, (VITAMIN B-12 ORAL) Take by mouth daily.     ??? dexlansoprazole 60 mg capsule Take 60 mg by mouth daily. 90 capsule 3   ??? dicyclomine (BENTYL) 20 mg tablet Take 20 mg by mouth Three (3) times a day. ONLY TAKING 2 A DAY 20 tablet 1   ??? dupilumab (DUPIXENT PEN) 300 mg/2 mL PnIj Inject the contents of 2 pens (600 mg) under the skin once for loading. 4 mL 0   ??? dupilumab 300 mg/2 mL PnIj Inject the contents of one pen (300 mg) under the skin every two weeks as maintenance. 4 mL 11   ??? empty container Misc Use as directed to dispose of Dupixent pens. 1 each 2   ??? fluticasone furoate-vilanterol (BREO ELLIPTA) 100-25 mcg/dose inhaler Inhale 1 puff daily.     ??? gabapentin (NEURONTIN) 300 MG capsule Take 1 capsule by mouth twice daily and take 2 capsules every night at bedtime. 360 capsule 3   ??? hydroCHLOROthiazide (HYDRODIURIL) 12.5 MG tablet Take 12.5 mg by mouth daily.     ??? levalbuterol (XOPENEX) 0.63 mg/3 mL nebulizer solution Inhale 1 ampule by nebulization every four (4) hours as needed for wheezing.     ??? linaclotide (LINZESS) 145 mcg capsule Take 145 mcg by mouth.     ??? losartan (COZAAR) 100 MG tablet TAKE 1 TABLET BY MOUTH ONCE DAILY 90 tablet 1   ??? meTHIMazole (TAPAZOLE) 5 MG tablet Take 2.5 mg by mouth daily.      ??? polyethylene glycol (GLYCOLAX) 17 gram/dose powder 17 gm (one capful) with 8 ounces of liquid 510 g 12   ??? sucralfate (CARAFATE) 1 gram tablet Take 1 g by mouth Four (4) times a day.     ??? tiotropium (SPIRIVA WITH HANDIHALER) 18 mcg inhalation capsule PLACE 1 CAPSULE IN INHALER AND INHALE ONCE DAILY     ??? traMADoL (ULTRAM) 50 mg tablet Take 50 mg by mouth as needed in the morning.     ??? triamcinolone (KENALOG) 0.1 % cream APPLY TO AFFECTED AREA ON BACK TWICE DAILY FOR 2 WEEKS OR UNTIL RESOLVED 454 g 2   ??? nicotine (NICODERM CQ) 14 mg/24 hr patch Place 1 patch on the skin in the morning for 42 doses. On quit day begin by applying 14-mg patch daily for six weeks. 42 patch 0     No current facility-administered medications for this visit.     Social History     Socioeconomic History   ??? Marital status: Legally Separated     Spouse name: None   ??? Number of children: None   ??? Years of education: None   ??? Highest education level: None   Tobacco Use   ??? Smoking status: Current Every Day Smoker     Packs/day: 0.50     Types: Cigarettes   ??? Smokeless tobacco: Never Used   ??? Tobacco comment: Pt smokes roughly 10cpd with intent to quit using NRT   Vaping Use   ??? Vaping Use: Former   Substance and Sexual Activity   ??? Alcohol use: No     Alcohol/week: 0.0 standard drinks     Comment: quit 20 years ago, was heavy off and on   ??? Drug use: No  Other Topics Concern   ??? Do you use sunscreen? No   ??? Tanning bed use? No   ??? Are you easily burned? No   ??? Excessive sun exposure? No   ??? Blistering sunburns? No   Social History Narrative    05-11-15 - Daughter and grandson living with her because they have no where else to go.         01/31/16        PCMH Components:    ??    Family, social, cultural characteristics: daughter .      Patient has the following communication needs: vision issue wears glasses    Health Literacy: How confident are you that you understand your health issues/concerns, can participate in your care, and manage your care along with your physician: confident.    Behaviors Affecting Health: poor dietary choices.     Have you been seen by any medical provider that we have not referred you to since your last visit ? No    Discussed a Living Will with the patient and the : Patient interested in education materials about a living will. Materials provided at today's visit.         12-09-16 Daughter who was doing drugs has now moved out of her home. Her grandson is moving out with his girlfriend soon.      Social Determinants of Health     Financial Resource Strain: Low Risk    ??? Difficulty of Paying Living Expenses: Not hard at all   Food Insecurity: Food Insecurity Present   ??? Worried About Programme researcher, broadcasting/film/video in the Last Year: Often true   ??? Ran Out of Food in the Last Year: Often true   Transportation Needs: No Transportation Needs   ??? Lack of Transportation (Medical): No   ??? Lack of Transportation (Non-Medical): No   Stress: Stress Concern Present   ??? Feeling of Stress : Rather much     Family History   Problem Relation Age of Onset   ??? Arthritis Mother    ??? Dementia Mother    ??? Throat cancer Brother    ??? Diabetes Brother    ??? Cancer Maternal Uncle         Colon   ??? Glaucoma Maternal Grandmother    ??? Mental illness Daughter    ??? Substance Abuse Disorder Daughter    ??? Substance Abuse Disorder Son    ??? Colorectal Cancer Neg Hx    ??? Inflammatory bowel disease Neg Hx    ??? Melanoma Neg Hx    ??? Basal cell carcinoma Neg Hx    ??? Squamous cell carcinoma Neg Hx      Immunization History   Administered Date(s) Administered   ??? COVID-19 VACCINE,MRNA(MODERNA)(PF)(IM) 08/25/2019, 09/22/2019, 04/14/2020, 09/29/2020   ??? INFLUENZA TIV (TRI) 71MO+ W/ PRESERV (IM) 03/12/2016   ??? INFLUENZA TIV (TRI) PF (IM) 04/01/2007, 04/01/2007, 03/31/2008, 03/31/2008, 04/24/2010, 02/18/2012   ??? Influenza Vaccine Quad (IIV4 PF) 26mo+ injectable 03/10/2013, 03/17/2014, 02/26/2018, 02/23/2019, 02/15/2020   ??? Influenza Virus Vaccine, unspecified formulation 04/29/2003, 03/25/2017   ??? Influenza, High Dose (IIV4) 65 yrs & older 03/09/2015, 03/25/2017   ??? Novel Influenza-H1N1-09 04/22/2008   ??? PNEUMOCOCCAL POLYSACCHARIDE 23 01/31/2000, 05/11/2007, 04/30/2013, 01/11/2020   ??? Pneumococcal Conjugate 13-Valent 10/27/2014   ??? SHINGRIX-ZOSTER VACCINE (HZV), RECOMBINANT,SUB-UNIT,ADJUVANTED IM 01/20/2018, 06/24/2018   ??? TdaP 10/23/2006, 05/24/2011   ??? ZOSTAVAX - ZOSTER VACCINE, LIVE, SQ 01/18/2014       Health Maintenance  Topic Date Due   ??? Colonoscopy  08/28/2018   ??? Hemoglobin A1c  04/08/2020   ??? DTaP/Tdap/Td Vaccines (3 - Td or Tdap) 05/23/2021   ??? Serum Creatinine Monitoring  11/15/2021   ??? Potassium Monitoring  11/15/2021   ??? COPD Spirometry  07/04/2022   ??? Mammogram Start Age 68  09/09/2022   ??? DEXA Scan-Start Age 50  03/11/2023   ??? Lipid Screening  04/08/2024   ??? Hepatitis C Screen  Completed   ??? COVID-19 Vaccine  Completed   ??? Influenza Vaccine  Completed   ??? Zoster Vaccines  Completed       I have reviewed and (if needed) updated the patient's problem list, medications, allergies, past medical and surgical history, social and family history     [facility]   Goals     ??? Continue to do PT exercises at least once a day.      May 15, 2020 1:33 PM AWV Goal   Things to think about to help me reach my goal:     What are you going to do? See above   How and how much? ~10-15 mins at home daily; PT in office 1x weekly   How frequent? See above   Barriers to success? pain   Solutions to barriers? Reduced from 2x daily to 1x daily depending on pain level                       Review of Systems:  Constitutional: + Excess fatigue + Sweats + Lightheadedness  Eyes: + Blurred Vision  ENT: + Nasal Congestion + Sinus pain or pressure + Sneezing  Respiratory: + cough + SOB + Wheezing + Dypsnea on Exertion as above  Cardiovascular: + Chest pain or pressure + Ankle Edema  Gastrointestinal: + Nausea + Constipation + Abdominal pain  Breast: Denies breast pain, breast lumps, nipple discharge, or other breast changes.  Genitourinary:Denies urinary frequency, dysuria, urgency, hematuria, incontinence, vaginal discharge, changes in urination,  vaginal irritation/itching, irregular or painful menses (IF MENSTRATRING), dysparunia  Musculoskeletal: + back and leg pain, chronic  Skin/Nails/Hair: + Rash + Itching  Neurological: Denies numbness, tingling, weakness, balance problems, or new headaches  Mental Health: + Sleep disturbance + Depression + Anxiety or panic symptoms  Endocrine: + Polydipsia + Heat Intolerance  Hematologic: Denies unusual bleeding/bruising or lymphadenopathy.  Allergy: + Seasonal allergies + Sneezing + Itchy watery eyes    Physical Exam:  Vital Signs:  Vitals:    11/21/20 1405   BP: 134/70 Pulse: 103   Temp: 36.2 ??C (97.1 ??F)   SpO2: 96%     Height: 159.8 cm (5' 2.91)    Body mass index is 30.38 kg/m??.  Wt Readings from Last 3 Encounters:   11/21/20 77.6 kg (171 lb)   11/15/20 74.8 kg (165 lb)   08/15/20 69.7 kg (153 lb 9.6 oz)     No LMP recorded. Patient has had a hysterectomy.    General: chronically ill appearing. , nervous and anxious. See BMI.  Head: Normocephalic, Atraumatic  Eyes: Sclera and conjunctiva clear, no drainage.PERRL, EOMI bilaterally.  Ears: External ears normal, Canals clear,  TMs with normal light reflex  Nose  [place] in place  Mouth mucus membranes moist, posterior oropharynx without erythema or exudates.  Neck: Supple, normal ROM, no thyromegaly.  Lymph Nodes: No cervical or supraclavicular lymphadenopathy.  Skin: No rashes or obvious concerning lesions on exposed skin.  Cardiovascular: RRR, normal S1/S2, no murmurs, rubs  or gallops. No chest wall TTP.  Lungs: Diffuse inspiratory and expiratory wheezing throughout all lung fields. On 2L O2. Intermittent tripoding.   Abdomen:  Soft, non-distended, non-tender, no HSM or masses. Normal bowel sounds.  Extremities: Trace edema, pulses 2 +  PT - moving legs constantly during inview  Neurologic: CNs 2-12 grossly intact, balance normal. Gross motor/fine motor normal.  Psychiatry: Depressed affect - perseverating on the idea that no one will admit that I am dying - when pressed states she is referring to family members who are not sympathetic about her current situation.     Imaging Studies:  No imaging ordered this visit.    Labs:  Recent Results (from the past 672 hour(s))   hsTroponin I (single, no delta)    Collection Time: 10/30/20 10:57 AM   Result Value Ref Range    hsTroponin I 6 <=34 ng/L   Basic Metabolic Panel    Collection Time: 10/30/20 10:57 AM   Result Value Ref Range    Sodium 140 135 - 145 mmol/L    Potassium 3.7 3.4 - 4.8 mmol/L    Chloride 110 (H) 98 - 107 mmol/L    CO2 25.7 20.0 - 31.0 mmol/L    Anion Gap 4 (L) 5 - 14 mmol/L    BUN 8 (L) 9 - 23 mg/dL    Creatinine 1.61 (H) 0.60 - 0.80 mg/dL    BUN/Creatinine Ratio 10     eGFR CKD-EPI (2021) Female 75 >=60 mL/min/1.5m2    Glucose 149 70 - 179 mg/dL    Calcium 09.6 8.7 - 04.5 mg/dL   [id number] PCR    Collection Time: 10/30/20 10:57 AM    Specimen: Nasopharyngeal Swab   Result Value Ref Range    SARS-CoV-2 PCR Negative Negative   CBC w/ Differential    Collection Time: 10/30/20 10:57 AM   Result Value Ref Range    WBC 9.1 3.6 - 11.2 10*9/L    RBC 4.44 3.95 - 5.13 10*12/L    HGB 12.7 11.3 - 14.9 g/dL    HCT 14.7 82.9 - 56.2 %    MCV 87.5 77.6 - 95.7 fL    MCH 28.5 25.9 - 32.4 pg    MCHC 32.6 32.0 - 36.0 g/dL    RDW 13.0 86.5 - 78.4 %    MPV 7.9 6.8 - 10.7 fL    Platelet 221 150 - 450 10*9/L    nRBC 0 <=4 /100 WBCs    Neutrophils % 68.3 %    Lymphocytes % 22.8 %    Monocytes % 7.4 %    Eosinophils % 0.7 %    Basophils % 0.8 %    Absolute Neutrophils 6.2 1.8 - 7.8 10*9/L    Absolute Lymphocytes 2.1 1.1 - 3.6 10*9/L    Absolute Monocytes 0.7 0.3 - 0.8 10*9/L    Absolute Eosinophils 0.1 0.0 - 0.5 10*9/L    Absolute Basophils 0.1 0.0 - 0.1 10*9/L   ECG 12 Lead    Collection Time: 10/30/20 11:00 AM   Result Value Ref Range    EKG Systolic BP  mmHg    EKG Diastolic BP  mmHg    EKG Ventricular Rate 93 BPM    EKG Atrial Rate 93 BPM    EKG P-R Interval 144 ms    EKG QRS Duration 70 ms    EKG Q-T Interval 350 ms    EKG QTC Calculation 435 ms    EKG Calculated P Axis 88 degrees  EKG Calculated R Axis 49 degrees    EKG Calculated T Axis 70 degrees    QTC Fredericia 405 ms   Urinalysis with Culture Reflex    Collection Time: 10/30/20 12:52 PM    Specimen: Clean Catch; Urine   Result Value Ref Range    Color, UA Yellow     Clarity, UA Clear     Specific Gravity, UA 1.015 1.005 - 1.040    pH, UA 5.5 5.0 - 9.0    Leukocyte Esterase, UA Negative Negative    Nitrite, UA Negative Negative    Protein, UA Negative Negative    Glucose, UA Negative Negative    Ketones, UA Negative Negative Urobilinogen, UA 0.2 mg/dL     Bilirubin, UA Negative Negative    Blood, UA Negative Negative    RBC, UA <1 <4 /HPF    WBC, UA <1 0 - 5 /HPF    Squam Epithel, UA 8 (H) 0 - 5 /HPF    Bacteria, UA Few (A) None Seen /HPF   Urine Culture    Collection Time: 10/30/20 12:52 PM    Specimen: Clean Catch; Urine   Result Value Ref Range    Urine Culture, Comprehensive Mixed Urogenital Flora    B-type natriuretic peptide    Collection Time: 11/12/20 10:39 PM   Result Value Ref Range    BNP 22 <=100 pg/mL   hsTroponin I (serial 0-2-6H w/ delta)    Collection Time: 11/12/20 10:39 PM   Result Value Ref Range    hsTroponin I 7 <=34 ng/L   Comprehensive Metabolic Panel    Collection Time: 11/12/20 10:39 PM   Result Value Ref Range    Sodium 138 135 - 145 mmol/L    Potassium 4.4 3.4 - 4.8 mmol/L    Chloride 106 98 - 107 mmol/L    CO2 31.0 20.0 - 31.0 mmol/L    Anion Gap 1 (L) 5 - 14 mmol/L    BUN 11 9 - 23 mg/dL    Creatinine 1.61 (H) 0.60 - 0.80 mg/dL    BUN/Creatinine Ratio 10     eGFR CKD-EPI (2021) Female 55 (L) >=60 mL/min/1.45m2    Glucose 119 70 - 179 mg/dL    Calcium 9.9 8.7 - 09.6 mg/dL    Albumin 3.6 3.4 - 5.0 g/dL    Total Protein 6.8 5.7 - 8.2 g/dL    Total Bilirubin 0.3 0.3 - 1.2 mg/dL    AST 18 <=04 U/L    ALT 17 10 - 49 U/L    Alkaline Phosphatase 100 46 - 116 U/L   Magnesium Level    Collection Time: 11/12/20 10:39 PM   Result Value Ref Range    Magnesium 2.1 1.6 - 2.6 mg/dL   TSH    Collection Time: 11/12/20 10:39 PM   Result Value Ref Range    TSH 0.690 0.550 - 4.780 uIU/mL   Rapid Influenza/RSV/COVID PCR    Collection Time: 11/12/20 10:39 PM    Specimen: Nasopharyngeal Swab   Result Value Ref Range    SARS-CoV-2 PCR Negative Negative    Influenza A Negative Negative    Influenza B Negative Negative    RSV Negative Negative   CBC w/ Differential    Collection Time: 11/12/20 10:39 PM   Result Value Ref Range    WBC 12.3 (H) 3.6 - 11.2 10*9/L    RBC 4.69 3.95 - 5.13 10*12/L    HGB 13.4 11.3 - 14.9 g/dL    HCT  40.6 34.0 - 44.0 %    MCV 86.7 77.6 - 95.7 fL    MCH 28.6 25.9 - 32.4 pg    MCHC 33.0 32.0 - 36.0 g/dL    RDW 16.1 09.6 - 04.5 %    MPV 8.2 6.8 - 10.7 fL    Platelet 217 150 - 450 10*9/L    Neutrophils % 79.3 %    Lymphocytes % 13.3 %    Monocytes % 6.3 %    Eosinophils % 0.2 %    Basophils % 0.9 %    Absolute Neutrophils 9.7 (H) 1.8 - 7.8 10*9/L    Absolute Lymphocytes 1.6 1.1 - 3.6 10*9/L    Absolute Monocytes 0.8 0.3 - 0.8 10*9/L    Absolute Eosinophils 0.0 0.0 - 0.5 10*9/L    Absolute Basophils 0.1 0.0 - 0.1 10*9/L   ECG 12 Lead    Collection Time: 11/12/20 11:33 PM   Result Value Ref Range    EKG Systolic BP  mmHg    EKG Diastolic BP  mmHg    EKG Ventricular Rate 97 BPM    EKG Atrial Rate 97 BPM    EKG P-R Interval 140 ms    EKG QRS Duration 68 ms    EKG Q-T Interval 346 ms    EKG QTC Calculation 439 ms    EKG Calculated P Axis 85 degrees    EKG Calculated R Axis 34 degrees    EKG Calculated T Axis 52 degrees    QTC Fredericia 405 ms   hsTroponin I - 2 Hour    Collection Time: 11/13/20 12:38 AM   Result Value Ref Range    hsTroponin I 6 <=34 ng/L    delta hsTroponin I 1 <=7 ng/L   ECG 12 Lead    Collection Time: 11/13/20 12:48 AM   Result Value Ref Range    EKG Systolic BP  mmHg    EKG Diastolic BP  mmHg    EKG Ventricular Rate 86 BPM    EKG Atrial Rate 86 BPM    EKG P-R Interval 148 ms    EKG QRS Duration 66 ms    EKG Q-T Interval 356 ms    EKG QTC Calculation 426 ms    EKG Calculated P Axis 80 degrees    EKG Calculated R Axis 31 degrees    EKG Calculated T Axis 50 degrees    QTC Fredericia 401 ms   hsTroponin I - 6 Hour    Collection Time: 11/13/20  5:00 AM   Result Value Ref Range    hsTroponin I 5 <=34 ng/L    delta hsTroponin I 1 <=7 ng/L   ECG 12 Lead    Collection Time: 11/13/20  5:07 AM   Result Value Ref Range    EKG Systolic BP  mmHg    EKG Diastolic BP  mmHg    EKG Ventricular Rate 77 BPM    EKG Atrial Rate 77 BPM    EKG P-R Interval 146 ms    EKG QRS Duration 68 ms    EKG Q-T Interval 376 ms    EKG QTC Calculation 425 ms    EKG Calculated P Axis 83 degrees    EKG Calculated R Axis 49 degrees    EKG Calculated T Axis 63 degrees    QTC Fredericia 408 ms   Blood Gas, Venous    Collection Time: 11/13/20 12:05 PM   Result Value Ref Range    Specimen Source Venous  FIO2 Venous Not Specified     pH, Venous 7.40 7.32 - 7.43    pCO2, Ven 59 40 - 60 mm Hg    pO2, Ven <20 (L) 30 - 55 mm Hg    HCO3, Ven 31 (H) 22 - 27 mmol/L    Base Excess, Ven 11.1 (H) -2.0 - 2.0    O2 Saturation, Venous 22.4 (L) 40.0 - 85.0 %   CBC    Collection Time: 11/14/20  6:06 AM   Result Value Ref Range    WBC 12.9 (H) 3.6 - 11.2 10*9/L    RBC 4.79 3.95 - 5.13 10*12/L    HGB 13.5 11.3 - 14.9 g/dL    HCT 16.1 09.6 - 04.5 %    MCV 88.3 77.6 - 95.7 fL    MCH 28.2 25.9 - 32.4 pg    MCHC 32.0 32.0 - 36.0 g/dL    RDW 40.9 81.1 - 91.4 %    MPV 8.5 6.8 - 10.7 fL    Platelet 232 150 - 450 10*9/L   Basic Metabolic Panel    Collection Time: 11/14/20  6:06 AM   Result Value Ref Range    Sodium 139 135 - 145 mmol/L    Potassium 4.3 3.4 - 4.8 mmol/L    Chloride 100 98 - 107 mmol/L    CO2 35.1 (H) 20.0 - 31.0 mmol/L    Anion Gap 4 (L) 5 - 14 mmol/L    BUN 18 9 - 23 mg/dL    Creatinine 7.82 (H) 0.60 - 0.80 mg/dL    BUN/Creatinine Ratio 16     eGFR CKD-EPI (2021) Female 52 (L) >=60 mL/min/1.12m2    Glucose 93 70 - 179 mg/dL    Calcium 95.6 8.7 - 21.3 mg/dL   C-reactive protein    Collection Time: 11/14/20  6:06 AM   Result Value Ref Range    CRP 6.0 <=10.0 mg/L   CK    Collection Time: 11/14/20  6:06 AM   Result Value Ref Range    Creatine Kinase, Total 74.0 34.0 - 145.0 U/L   CBC    Collection Time: 11/15/20  7:25 AM   Result Value Ref Range    WBC 13.4 (H) 3.6 - 11.2 10*9/L    RBC 4.81 3.95 - 5.13 10*12/L    HGB 13.6 11.3 - 14.9 g/dL    HCT 08.6 57.8 - 46.9 %    MCV 88.3 77.6 - 95.7 fL    MCH 28.3 25.9 - 32.4 pg    MCHC 32.0 32.0 - 36.0 g/dL    RDW 62.9 52.8 - 41.3 %    MPV 8.8 6.8 - 10.7 fL    Platelet 223 150 - 450 10*9/L   Basic Metabolic Panel    Collection Time: 11/15/20  7:25 AM   Result Value Ref Range    Sodium 138 135 - 145 mmol/L    Potassium 3.6 3.4 - 4.8 mmol/L    Chloride 102 98 - 107 mmol/L    CO2 30.6 20.0 - 31.0 mmol/L    Anion Gap 5 5 - 14 mmol/L    BUN 23 9 - 23 mg/dL    Creatinine 2.44 (H) 0.60 - 0.80 mg/dL    BUN/Creatinine Ratio 19     eGFR CKD-EPI (2021) Female 48 (L) >=60 mL/min/1.49m2    Glucose 81 70 - 179 mg/dL    Calcium 9.5 8.7 - 01.0 mg/dL       Assessment & Plan: Pathophysiology of condition(s)  reviewed with patient if applicable. Pt stated understanding and there were no barriers to learning.  SEE PATIENT INSTRUCTIONS BELOW FOR DISCUSSION/PLAN    Medication adherence and barriers to the treatment plan have been addressed. Opportunities to optimize healthy behaviors have been discussed. Patient / caregiver voiced understanding.     Diagnosis ICD-10-CM Associated Orders   1. COPD, moderate (CMS-HCC)  J44.9    2. Leg cramps  R25.2    3. Sleep apnea, unspecified type  G47.30    4. Bipolar affective disorder, current episode depressed, current episode severity unspecified (CMS-HCC)  F31.30    5. Tobacco use  Z72.0      I personally spent 36 minutes face-to-face and non-face-to-face in the care of this patient, which includes all pre, intra, and post visit time on the date of service.  We discussed treatment goals and options as well as possible side effects of the prescribed medication regimen. There were no significant barriers to learning identified. The patient /and or Guardian/[employer] agrees to continue the present plan except as outlined below.          EST NEW  04540      10-19 15-29  99213     20-29 30-44  99214     30-39 45-59  99215     40+  60+      Patient Instructions   PLEASE SEE DR. WALL TO HELP WITH ANXIETY AND DEPRESSION. CALL us TO LET us KNOW, OR WE CAN HAVE SARAH CALL YOU.    TALK TO YOUR INSURANCE COMPANY ABOUT HOW YOU CAN GET HOME HEALTH AID.    PLEASE WORK ON STOPPING SMOKING. START NICOTINE PATCHES TO HELP. Return in about 3 months (around 02/21/2021) for Recheck gen health.Paulino Door   Medical Student, [facility]    Lorel Monaco.Cathey Endow, MD  Family Physician    [facility]  [address]  [place], [place] [address]  Ph [phone]  Fax [phone]

## 2020-11-21 NOTE — Unmapped (Addendum)
PLEASE SEE DR. WALL TO HELP WITH ANXIETY AND DEPRESSION. CALL us TO LET us KNOW, OR WE CAN HAVE SARAH CALL YOU.    TALK TO YOUR INSURANCE COMPANY ABOUT HOW YOU CAN GET HOME HEALTH AID.    PLEASE WORK ON STOPPING SMOKING. START NICOTINE PATCHES TO HELP.

## 2020-11-22 NOTE — Unmapped (Signed)
Dr. Cathey Endow asked me to contact patient to check in and see when she sees Dr. Daleen Squibb next.

## 2020-11-22 NOTE — Unmapped (Signed)
June 23rd at 2:30 is patient's next appt.

## 2020-11-22 NOTE — Unmapped (Signed)
Noted thanks. LMB

## 2020-11-22 NOTE — Unmapped (Signed)
Patient said it is not on her calender. She is going to call Dr. Vern Claude office and call us back and let us know.

## 2020-11-27 MED ORDER — GABAPENTIN 300 MG CAPSULE
ORAL_CAPSULE | 0 refills | 0 days | Status: CP
Start: 2020-11-27 — End: ?

## 2020-11-27 NOTE — Unmapped (Signed)
Pt left message on nurse line requesting medication for leg and toe cramps. Pt was recently seen by Dr. Cathey Endow on 11/21/2020. alm

## 2020-11-28 NOTE — Unmapped (Signed)
Sorry, labs did not show a problem we can fix. Try a warm shower or bath before bed. Make sure she is wearing oxygen during sleep.

## 2020-11-28 NOTE — Unmapped (Signed)
Patient wants to know what could be causing them? She wants to know if magnesium would help. She said the doctor saw how bad her cramps were in the hospital and they gave her muscle relaxers. She is asking if that is an option.

## 2020-11-29 NOTE — Unmapped (Signed)
Pt. contacted and voiced understanding of the message. She still wants to know if magnesium would be ok to try?

## 2020-11-29 NOTE — Unmapped (Signed)
We discussed this at her office visit. I would encourage her to write this down so she remembers. Her oxygen getting low, spine issues and/or being dehyrdated causes them. Muscle relaxers can not be used at home since she lives alone and this increases her falls risk.

## 2020-11-30 NOTE — Unmapped (Signed)
Pt. contacted and voiced understanding of the message.

## 2020-11-30 NOTE — Unmapped (Signed)
She can use 2-3 times per week but her magnesium was completely normal 2 weeks ago so taking it every day could make it go too high.

## 2020-12-01 ENCOUNTER — Ambulatory Visit: Admit: 2020-12-01 | Discharge: 2020-12-03 | Disposition: A | Discharge status: Hospice | Payer: MEDICARE

## 2020-12-01 LAB — COMPREHENSIVE METABOLIC PANEL
ALBUMIN: 3.3 g/dL — ABNORMAL LOW (ref 3.4–5.0)
ALKALINE PHOSPHATASE: 84 U/L (ref 46–116)
ALT (SGPT): 14 U/L (ref 10–49)
ANION GAP: 5 mmol/L (ref 5–14)
AST (SGOT): 16 U/L (ref <=34)
BILIRUBIN TOTAL: 0.3 mg/dL (ref 0.3–1.2)
BLOOD UREA NITROGEN: 6 mg/dL — ABNORMAL LOW (ref 9–23)
BUN / CREAT RATIO: 7
CALCIUM: 9.2 mg/dL (ref 8.7–10.4)
CHLORIDE: 110 mmol/L — ABNORMAL HIGH (ref 98–107)
CO2: 26.8 mmol/L (ref 20.0–31.0)
CREATININE: 0.89 mg/dL — ABNORMAL HIGH
EGFR CKD-EPI (2021) FEMALE: 69 mL/min/{1.73_m2} (ref >=60)
GLUCOSE RANDOM: 151 mg/dL (ref 70–179)
POTASSIUM: 4.2 mmol/L (ref 3.4–4.8)
PROTEIN TOTAL: 6.4 g/dL (ref 5.7–8.2)
SODIUM: 142 mmol/L (ref 135–145)

## 2020-12-01 LAB — CBC W/ AUTO DIFF
BASOPHILS ABSOLUTE COUNT: 0 10*9/L (ref 0.0–0.1)
BASOPHILS RELATIVE PERCENT: 0.3 %
EOSINOPHILS ABSOLUTE COUNT: 0 10*9/L (ref 0.0–0.5)
EOSINOPHILS RELATIVE PERCENT: 0 %
HEMATOCRIT: 37.4 % (ref 34.0–44.0)
HEMOGLOBIN: 12.2 g/dL (ref 11.3–14.9)
LYMPHOCYTES ABSOLUTE COUNT: 0.4 10*9/L — ABNORMAL LOW (ref 1.1–3.6)
LYMPHOCYTES RELATIVE PERCENT: 4 %
MEAN CORPUSCULAR HEMOGLOBIN CONC: 32.6 g/dL (ref 32.0–36.0)
MEAN CORPUSCULAR HEMOGLOBIN: 28.5 pg (ref 25.9–32.4)
MEAN CORPUSCULAR VOLUME: 87.4 fL (ref 77.6–95.7)
MEAN PLATELET VOLUME: 8.2 fL (ref 6.8–10.7)
MONOCYTES ABSOLUTE COUNT: 0.1 10*9/L — ABNORMAL LOW (ref 0.3–0.8)
MONOCYTES RELATIVE PERCENT: 1.2 %
NEUTROPHILS ABSOLUTE COUNT: 8.6 10*9/L — ABNORMAL HIGH (ref 1.8–7.8)
NEUTROPHILS RELATIVE PERCENT: 94.5 %
NUCLEATED RED BLOOD CELLS: 0 /100{WBCs} (ref <=4)
PLATELET COUNT: 195 10*9/L (ref 150–450)
RED BLOOD CELL COUNT: 4.28 10*12/L (ref 3.95–5.13)
RED CELL DISTRIBUTION WIDTH: 15 % (ref 12.2–15.2)
WBC ADJUSTED: 9.1 10*9/L (ref 3.6–11.2)

## 2020-12-01 LAB — HIGH SENSITIVITY TROPONIN I - SINGLE: HIGH SENSITIVITY TROPONIN I: 4 ng/L (ref <=34)

## 2020-12-01 MED ADMIN — ipratropium-albuteroL (DUO-NEB) 0.5-2.5 mg/3 mL nebulizer solution 3 mL: 3 mL | RESPIRATORY_TRACT | @ 18:00:00 | Stop: 2020-12-01

## 2020-12-01 MED ADMIN — ipratropium-albuteroL (DUO-NEB) 0.5-2.5 mg/3 mL nebulizer solution 3 mL: 3 mL | RESPIRATORY_TRACT | @ 19:00:00 | Stop: 2020-12-01

## 2020-12-01 MED ADMIN — cefTRIAXone (ROCEPHIN) 1 g in sodium chloride 0.9 % (NS) 100 mL IVPB-connector bag: 1 g | INTRAVENOUS | @ 23:00:00 | Stop: 2020-12-01

## 2020-12-01 MED ADMIN — ondansetron (ZOFRAN) injection 4 mg: 4 mg | INTRAVENOUS | @ 19:00:00 | Stop: 2020-12-01

## 2020-12-01 MED ADMIN — azithromycin (ZITHROMAX) tablet 500 mg: 500 mg | ORAL | @ 18:00:00 | Stop: 2020-12-01

## 2020-12-01 MED ADMIN — methylPREDNISolone sodium succinate (PF) (Solu-MEDROL) injection 125 mg: 125 mg | INTRAVENOUS | @ 18:00:00 | Stop: 2020-12-01

## 2020-12-01 NOTE — Unmapped (Signed)
Patient escorted to bathroom with O2 sat 79%. Pt went to bathroom sitting on the toilet unable to get up. Patient assistance pt to get up on the toilet . Pt feel SOB ambulatory out of balance . Called for help. Charge Nurse Trey Paula RN arrived to the bathroom assistance pt back to the room 8. Patient escorted back to the room 8 paced on cardiac monitor, continuous pulse oximeter and continuous blood pressure monitoring. Patient worry about not to go home. Patient stated she always SOB. Patient tearful at the bedside . Patient request to go home. Nurse Felicity Pellegrini RN arrived at the bedside with patient.

## 2020-12-01 NOTE — Unmapped (Signed)
Patient with emphysema presenting with shortness of breath with bilateral pedal edema. On 2L [place] O2 at baseline. Tachypneic in triage.

## 2020-12-01 NOTE — Unmapped (Signed)
[facility]  Emergency Department Provider Note    Patient Identification  Lisa Andrews  Patient information was obtained from patient  History/Exam limitations: none     ED Clinical Impression      Final diagnoses:   COPD exacerbation (CMS-HCC) (Primary)   Dyspnea, unspecified type         Impression, Medical Decision Making, and ED Course      Impression & MDM: The patient is a 70 y.o. female with notable history of COPD/chronic bronchitis, hypertension, and bipolar disorder who presents with shortness of breath and weakness consistent with prior COPD exacerbations. She states this has been going on for the last week, no fevers over that time. On arrival, patient is anxious and tearful, but appears well. On exam, she has diffuse wheezing, coarse crackles and frequently pauses to take deep breaths. Otherwise normal exam.    Overall, patient arrives with acute on chronic shortness of breath and wheezing.  She likely is suffering from a COPD exacerbation given her known diagnosis, frequent exacerbations in the past.  Unclear trigger, no fevers or chills to suggest infectious etiology as her trigger but this could be due to a occult viral upper respiratory infection.  We will plan for COVID test, treatment of COPD exacerbation with 3x duonebs, steroids, chest x-ray to look for lobar pneumonia.  Less likely a pulmonary embolus, recently had a CT PE which was negative back in early June.    ED Course:  ED Course as of 12/02/20 1153   Fri Dec 01, 2020   1418 XR Chest Portable  IMPRESSION:  Lung findings of emphysema with no acute cardiopulmonary abnormality.   1454 SARS-CoV-2 PCR: Negative   1523 Patient still with profound wheezing   1600 Patient rapidly desaturated with ambulatory O2 check, at least 86%.  Now stable on 2 L at rest which is her baseline but I do worry that she is still wheezing.  We will plan to admit for COPD exacerbation. _____________________________________________________________________    The case was discussed with attending physician Dr. Dimple Casey who is in agreement with the above assessment and plan     Additional Medical Decision Making     I have reviewed the vital signs and the nursing notes. Labs and radiology results that were available during my care of the patient were independently reviewed by me and considered in my medical decision making.     I directly visualized and independently interpreted the EKG tracing.   I independently visualized the radiology images.   I reviewed the patient's prior medical records (DC Summary 11/15/20, Telephone Encounter 11/22/20).     Portions of this record have been created using Scientist, clinical (histocompatibility and immunogenetics). Dictation errors have been sought, but may not have been identified and corrected.  ____________________________________________         History        Chief Complaint  Shortness of Breath      HPI   Lisa Andrews is a 70 y.o. female with a history of anxiety and depression, asthma, bipolar disorder, COPD bronchitis, GERD, hypertension, seizures, and thyroid disease who presents with shortness of breath. Patient reports shortness of breath and weakness consistent with prior COPD exacerbations. She states that she feels like she is suffocating and she has to sit up in order to fall asleep because her shortness of breath worsens with lying flat. She notes that her symptoms improve with prednisone, but when she stops taking it, she begins to feel weak  and short of breath again. She also has an albuterol, home oxygen (2L), and a nebulizer, which she has used without relief. She reports having sinus problems which causes difficulty breathing with a nasal canula. She also endorses swelling of her ankles that is slightly worse than her baseline swelling. Of note, she moved in with her aunt and cousin who is her caretaker. Recently, no new changes to her daily life. No new medications or recent illnesses. She notes that for her COPD and emphysema, she takes breo ellipta, albuterol, tiotropium. Also taking busipirone, and bupropion. No new meds. No fevers or chills.    Past Medical History:   Diagnosis Date   ??? Anxiety and depression    ??? Arthritis    ??? Asthma    ??? At high risk for falls 04/03/2018   ??? Bipolar disorder (CMS-HCC)    ??? COPD bronchitis    ??? Eczema    ??? GERD (gastroesophageal reflux disease)    ??? Headache    ??? Hearing impairment    ??? Hypertension    ??? Impaired mobility    ??? Lung disease    ??? Memory deficit 02/27/2018   ??? Seizures (CMS-HCC)    ??? Thyroid disease     hypothyroid in the past, now euthyroid per patient.   ??? Urinary tract infection     resolved       Patient Active Problem List   Diagnosis   ??? Constipation   ??? Irritable colon   ??? Urinary tract infection SEES UROLOGY   ??? Vitamin B12 deficiency   ??? Seizure disorder (CMS-HCC)   ??? Tobacco use   ??? Sleep apnea - does not use CPAP - has 2LO2 at night   ??? Eczema   ??? Allergic rhinitis   ??? Essential hypertension   ??? GERD (gastroesophageal reflux disease)   ??? Bipolar affect, depressed - SEES PSYCHIATRY   ??? Hyperthyroidism   ??? Osteopenia of lumbar spine   ??? Asthma   ??? Foot pain, bilateral   ??? Dermatitis   ??? Disorder of bone and cartilage   ??? Tobacco use disorder   ??? Moderate COPD - sees PULMONOLOGY  (RAF-HCC)   ??? Vitamin B-complex deficiency   ??? Bell's palsy   ??? Arthritis   ??? Headache   ??? COPD with acute exacerbation (CMS-HCC)   ??? Post herpetic neuralgia   ??? Encounter for long-term current use of medication   ??? Splenic artery aneurysm (CMS-HCC)   ??? Family circumstance NOS   ??? Mixed hyperlipidemia   ??? Excessive tearing, right   ??? History of vertigo   ??? Noncompliance   ??? Prediabetes   ??? Phlebitis   ??? Onychomycosis   ??? Osteoarthritis   ??? Memory deficit   ??? COPD, moderate (CMS-HCC)   ??? At high risk for falls   ??? RLS (restless legs syndrome)   ??? Sciatica of right side   ??? Gastritis with intestinal metaplasia of stomach   ??? Adenomatous polyp of colon   ??? Chronic bilateral low back pain with bilateral sciatica   ??? Lumbar stenosis with neurogenic claudication       Past Surgical History:   Procedure Laterality Date   ??? ABDOMINAL HERNIA REPAIR     ??? GALLBLADDER SURGERY     ??? HYSTERECTOMY      TAHBSO   ??? PR UPPER GI ENDOSCOPY,DIAGNOSIS N/A 09/28/2013    Procedure: UGI ENDO, INCLUDE ESOPHAGUS, STOMACH, & DUODENUM &/OR JEJUNUM; DX W/WO COLLECTION  SPECIMN, BY BRUSH OR WASH;  Surgeon: Marygrace Drought, MD;  [facility];  Service: Gastroenterology   ??? SKIN BIOPSY     ??? TOTAL ABDOMINAL HYSTERECTOMY W/ BILATERAL SALPINGOOPHORECTOMY           Current Facility-Administered Medications:   ???  albuterol (PROVENTIL HFA;VENTOLIN HFA) 90 mcg/actuation inhaler 2 puff, 2 puff, Inhalation, Q4H PRN, Beaulah Dinning, MD  ???  buPROPion (WELLBUTRIN XL) 24 hr tablet 300 mg, 300 mg, Oral, QAM, Beaulah Dinning, MD, 300 mg at 12/02/20 1610  ???  cetirizine (ZyrTEC) tablet 10 mg, 10 mg, Oral, Daily, Beaulah Dinning, MD, 10 mg at 12/02/20 1007  ???  cholecalciferol (vitamin D3 25 mcg (1,000 units)) tablet 25 mcg, 25 mcg, Oral, Daily, Beaulah Dinning, MD, 25 mcg at 12/02/20 1007  ???  dicyclomine (BENTYL) tablet 20 mg, 20 mg, Oral, TID, Beaulah Dinning, MD, 20 mg at 12/02/20 1007  ???  enoxaparin (LOVENOX) syringe 40 mg, 40 mg, Subcutaneous, Q24H SCH, Beaulah Dinning, MD, 40 mg at 12/02/20 1007  ???  famotidine (PEPCID) tablet 40 mg, 40 mg, Oral, Daily, Beaulah Dinning, MD, 40 mg at 12/02/20 1006  ???  fluticasone furoate-vilanteroL (BREO ELLIPTA) 100-25 mcg/dose inhaler 1 puff, 1 puff, Inhalation, Daily, Beaulah Dinning, MD, 1 puff at 12/02/20 1109  ???  gabapentin (NEURONTIN) capsule 200 mg, 200 mg, Oral, Nightly, Beaulah Dinning, MD, 200 mg at 12/01/20 2255  ???  hydroCHLOROthiazide (HYDRODIURIL) tablet 12.5 mg, 12.5 mg, Oral, Daily, Beaulah Dinning, MD, 12.5 mg at 12/02/20 1006  ???  hydrocortisone 2.5 % cream 1 application, 1 application, Topical, BID, Jolyn Nap, MD, 1 application at 12/02/20 1008  ???  levalbuterol (XOPENEX) nebulizer solution 0.63 mg, 1 ampule, Nebulization, Q4H PRN, Beaulah Dinning, MD, 0.63 mg at 12/02/20 0359  ???  lidocaine (LIDODERM) 5 % patch 1 patch, 1 patch, Transdermal, Daily, Jolyn Nap, MD  ???  methIMAzole (TAPAZOLE) split tablet 2.5 mg, 2.5 mg, Oral, Daily, Beaulah Dinning, MD  ???  nicotine (NICODERM CQ) 14 mg/24 hr patch 1 patch, 1 patch, Transdermal, Q24H, Beaulah Dinning, MD, 1 patch at 12/01/20 2255  ???  ondansetron (ZOFRAN-ODT) disintegrating tablet 4 mg, 4 mg, Oral, Q8H PRN, Beaulah Dinning, MD, 4 mg at 12/02/20 1017  ???  pantoprazole (PROTONIX) EC tablet 40 mg, 40 mg, Oral, Daily, Beaulah Dinning, MD, 40 mg at 12/02/20 1007  ???  polyethylene glycol (MIRALAX) packet 17 g, 17 g, Oral, Daily PRN, Juanda Chance, MD  ???  predniSONE (DELTASONE) tablet 40 mg, 40 mg, Oral, Daily, Juanda Chance, MD, 40 mg at 12/02/20 1007  ???  umeclidinium (INCRUSE ELLIPTA) 62.5 mcg/actuation inhaler 1 puff, 1 puff, Inhalation, Daily (RT), Beaulah Dinning, MD, 1 puff at 12/02/20 1108    Allergies  Nsaids (non-steroidal anti-inflammatory drug), Promethazine hcl, Trimethoprim, Aspirin, Lipitor [atorvastatin], Sulfa (sulfonamide antibiotics), and Sulfamethoxazole    Family History   Problem Relation Age of Onset   ??? Arthritis Mother    ??? Dementia Mother    ??? Throat cancer Brother    ??? Diabetes Brother    ??? Cancer Maternal Uncle         Colon   ??? Glaucoma Maternal Grandmother    ??? Mental illness Daughter    ??? Substance Abuse Disorder Daughter    ??? Substance Abuse Disorder Son    ??? Colorectal Cancer Neg Hx    ??? Inflammatory bowel  disease Neg Hx    ??? Melanoma Neg Hx    ??? Basal cell carcinoma Neg Hx    ??? Squamous cell carcinoma Neg Hx        Social History  Social History     Tobacco Use   ??? Smoking status: Current Every Day Smoker     Packs/day: 0.50     Types: Cigarettes   ??? Smokeless tobacco: Never Used   ??? Tobacco comment: Pt smokes roughly 10cpd with intent to quit using NRT   Vaping Use   ??? Vaping Use: Former   Substance Use Topics   ??? Alcohol use: No     Alcohol/week: 0.0 standard drinks     Comment: quit 20 years ago, was heavy off and on   ??? Drug use: No       Review of Systems  Constitutional: Negative for fever, recent weight loss/weight gain, chills.  Eyes: Negative for visual changes.  ENT: Negative for sore throat.  Cardiovascular: Positive for bilateral ankle swelling. Negative for chest pain and palpitations  Respiratory: Positive for shortness of breath and cough  Gastrointestinal: Negative for abdominal pain, vomiting, diarrhea and blood in the stool.  Genitourinary: Negative for dysuria and urinary frequency.  Musculoskeletal: Negative for back pain.  Skin: Negative for rash.  Neurological: Negative for headaches, focal weakness or numbness.     Physical Exam     ED Triage Vitals [12/01/20 1301]   Enc Vitals Group      BP 129/101      Heart Rate 103      SpO2 Pulse       Resp 18      Temp 37 ??C (98.6 ??F)      Temp Source Oral      SpO2 98 %     Constitutional: Alert and oriented.  Chronically ill-appearing, tearful.  Eyes: Conjunctivae are normal.  Mouth/Throat: Mucous membranes are moist. No oropharyngeal exudate or erythema.  Cardiovascular: Normal rate, regular rhythm. No murmurs, rubs, or gallops appreciated. Normal and symmetric distal pulses are present in all extremities. No lower extremity edema.  Respiratory: Mildly increased work of breathing at rest, speaking in 7 word sentences.  Diffuse expiratory wheezes noted with some coarse crackles on inspiration.  Gastrointestinal: Soft and nontender to palpation. No rebound or guarding.   Genitourinary: No suprapubic tenderness; no CVA tenderness bilaterally.  Musculoskeletal: Normal range of motion in all extremities. No obvious deformity in any extremity.  Neurologic: Appearance without facial droop, language without expressive or receptive aphasia. Moves all extremities equally. No gross focal neurologic deficits are appreciated.   Skin: Skin is warm, dry and intact. No rash noted. No obvious skin breakdown.       Radiology     XR Chest Portable   Final Result      *Lung findings of emphysema with no acute cardiopulmonary abnormality.              Dagoberto Ligas, MD  PGY2, [facility]  Pager #: [phone]    Documentation assistance was provided by Osvaldo Human and Yetta Barre, Scribes, on November 30, 2020 at 5:38 PM for Corey Skains, MD.      Documentation assistance was provided by the scribe in my presence.  The documentation recorded by the scribe has been reviewed by me and accurately reflects the services I personally performed.         Dagoberto Ligas, MD  Resident  12/02/20 [phone]

## 2020-12-01 NOTE — Unmapped (Signed)
ISAR Screening- for all patients 65+ community-dwelling patients    1. Before the illness or injury that brought you to the Emergency Department, did you need someone to help you on a regular basis?yes  2. Since the illness or injury that brought you to the Emergency Department, have you needed more help than usual to take care of yourself?no  3. Have you been hospitalized for one or more nights during the past six months (excluding a stay in the Emergency Department)?yes  4. In general, do you see well?no  5. In general, do you have serious problems with your memory?no  6. Do you take more than five different prescribed medications every day?yes    A score of 3 or more will require a Case Management consultation. Primary RN notified of score of 3 or more and CM paged for assessment.

## 2020-12-02 LAB — COMPREHENSIVE METABOLIC PANEL
ALBUMIN: 3 g/dL — ABNORMAL LOW (ref 3.4–5.0)
ALKALINE PHOSPHATASE: 86 U/L (ref 46–116)
ALT (SGPT): 15 U/L (ref 10–49)
ANION GAP: 5 mmol/L (ref 5–14)
AST (SGOT): 11 U/L (ref <=34)
BILIRUBIN TOTAL: 0.2 mg/dL — ABNORMAL LOW (ref 0.3–1.2)
BLOOD UREA NITROGEN: 13 mg/dL (ref 9–23)
BUN / CREAT RATIO: 14
CALCIUM: 9.5 mg/dL (ref 8.7–10.4)
CHLORIDE: 106 mmol/L (ref 98–107)
CO2: 29.6 mmol/L (ref 20.0–31.0)
CREATININE: 0.9 mg/dL — ABNORMAL HIGH
EGFR CKD-EPI (2021) FEMALE: 68 mL/min/{1.73_m2} (ref >=60)
GLUCOSE RANDOM: 114 mg/dL (ref 70–179)
POTASSIUM: 5.7 mmol/L — ABNORMAL HIGH (ref 3.4–4.8)
PROTEIN TOTAL: 6.4 g/dL (ref 5.7–8.2)
SODIUM: 141 mmol/L (ref 135–145)

## 2020-12-02 LAB — CBC
HEMATOCRIT: 37.3 % (ref 34.0–44.0)
HEMOGLOBIN: 12.3 g/dL (ref 11.3–14.9)
MEAN CORPUSCULAR HEMOGLOBIN CONC: 33 g/dL (ref 32.0–36.0)
MEAN CORPUSCULAR HEMOGLOBIN: 28.8 pg (ref 25.9–32.4)
MEAN CORPUSCULAR VOLUME: 87.1 fL (ref 77.6–95.7)
MEAN PLATELET VOLUME: 8.4 fL (ref 6.8–10.7)
PLATELET COUNT: 217 10*9/L (ref 150–450)
RED BLOOD CELL COUNT: 4.28 10*12/L (ref 3.95–5.13)
RED CELL DISTRIBUTION WIDTH: 14.9 % (ref 12.2–15.2)
WBC ADJUSTED: 9.8 10*9/L (ref 3.6–11.2)

## 2020-12-02 LAB — B-TYPE NATRIURETIC PEPTIDE: B-TYPE NATRIURETIC PEPTIDE: 116.81 pg/mL — ABNORMAL HIGH (ref <=100)

## 2020-12-02 LAB — POTASSIUM: POTASSIUM: 4.6 mmol/L (ref 3.4–4.8)

## 2020-12-02 MED ADMIN — predniSONE (DELTASONE) tablet 40 mg: 40 mg | ORAL | @ 14:00:00 | Stop: 2020-12-07

## 2020-12-02 MED ADMIN — ondansetron (ZOFRAN-ODT) disintegrating tablet 4 mg: 4 mg | ORAL | @ 14:00:00

## 2020-12-02 MED ADMIN — dicyclomine (BENTYL) tablet 20 mg: 20 mg | ORAL | @ 18:00:00

## 2020-12-02 MED ADMIN — cholecalciferol (vitamin D3 25 mcg (1,000 units)) tablet 25 mcg: 25 ug | ORAL | @ 14:00:00

## 2020-12-02 MED ADMIN — hydrOXYzine (ATARAX) tablet 25 mg: 25 mg | ORAL | @ 18:00:00

## 2020-12-02 MED ADMIN — fluticasone furoate-vilanteroL (BREO ELLIPTA) 100-25 mcg/dose inhaler 1 puff: 1 | RESPIRATORY_TRACT | @ 15:00:00

## 2020-12-02 MED ADMIN — nicotine (NICODERM CQ) 14 mg/24 hr patch 1 patch: 1 | TRANSDERMAL | @ 03:00:00

## 2020-12-02 MED ADMIN — buPROPion (WELLBUTRIN XL) 24 hr tablet 300 mg: 300 mg | ORAL | @ 10:00:00

## 2020-12-02 MED ADMIN — dicyclomine (BENTYL) tablet 20 mg: 20 mg | ORAL | @ 03:00:00

## 2020-12-02 MED ADMIN — ondansetron (ZOFRAN-ODT) disintegrating tablet 4 mg: 4 mg | ORAL | @ 02:00:00

## 2020-12-02 MED ADMIN — dicyclomine (BENTYL) tablet 20 mg: 20 mg | ORAL | @ 14:00:00

## 2020-12-02 MED ADMIN — famotidine (PEPCID) tablet 40 mg: 40 mg | ORAL | @ 14:00:00

## 2020-12-02 MED ADMIN — enoxaparin (LOVENOX) syringe 40 mg: 40 mg | SUBCUTANEOUS | @ 14:00:00

## 2020-12-02 MED ADMIN — ondansetron (ZOFRAN-ODT) disintegrating tablet 4 mg: 4 mg | ORAL | @ 08:00:00

## 2020-12-02 MED ADMIN — pantoprazole (PROTONIX) EC tablet 40 mg: 40 mg | ORAL | @ 14:00:00

## 2020-12-02 MED ADMIN — gabapentin (NEURONTIN) capsule 200 mg: 200 mg | ORAL | @ 03:00:00

## 2020-12-02 MED ADMIN — cetirizine (ZyrTEC) tablet 10 mg: 10 mg | ORAL | @ 14:00:00

## 2020-12-02 MED ADMIN — umeclidinium (INCRUSE ELLIPTA) 62.5 mcg/actuation inhaler 1 puff: 1 | RESPIRATORY_TRACT | @ 15:00:00

## 2020-12-02 MED ADMIN — lidocaine (LIDODERM) 5 % patch 1 patch: 1 | TRANSDERMAL | @ 16:00:00

## 2020-12-02 MED ADMIN — hydrocortisone 2.5 % cream 1 application: 1 | TOPICAL | @ 14:00:00

## 2020-12-02 MED ADMIN — methIMAzole (TAPAZOLE) split tablet 2.5 mg: 2.5 mg | ORAL | @ 16:00:00

## 2020-12-02 MED ADMIN — levalbuterol (XOPENEX) nebulizer solution 0.63 mg: 1 | RESPIRATORY_TRACT | @ 08:00:00

## 2020-12-02 MED ADMIN — hydroCHLOROthiazide (HYDRODIURIL) tablet 12.5 mg: 12.5 mg | ORAL | @ 14:00:00

## 2020-12-02 NOTE — Unmapped (Signed)
For additional assistance finding food or other resources in your community contact [place] [employer].    By phone: DIAL 2-1-1 Or [phone]    Or Check them out online at [email or web address]://[email or web address].org/

## 2020-12-02 NOTE — Unmapped (Signed)
Medications given per MAR. Remain on 3L [place]. Falls precautions maintained. PT/OT eval pending. Will continue to monitor.    Problem: Adult Inpatient Plan of Care  Goal: Plan of Care Review  Outcome: Progressing  Goal: Patient-Specific Goal (Individualized)  Outcome: Progressing  Goal: Absence of Hospital-Acquired Illness or Injury  Outcome: Progressing  Goal: Optimal Comfort and Wellbeing  Outcome: Progressing     Problem: Asthma Comorbidity  Goal: Maintenance of Asthma Control  Outcome: Progressing     Problem: COPD (Chronic Obstructive Pulmonary Disease) Comorbidity  Goal: Maintenance of COPD Symptom Control  Outcome: Progressing     Problem: Hypertension Comorbidity  Goal: Blood Pressure in Desired Range  Outcome: Progressing     Problem: Anxiety  Goal: Anxiety Reduction or Resolution  Outcome: Progressing     Problem: Fall Injury Risk  Goal: Absence of Fall and Fall-Related Injury  Outcome: Progressing

## 2020-12-02 NOTE — Unmapped (Signed)
.  Patient rounding complete, call bell in reach, bed locked and in lowest position, patient belongings at bedside and within reach of patient.  Patient updated on plan of care.      Pt resting on the bed sleeping quite at this time.

## 2020-12-02 NOTE — Unmapped (Signed)
OCCUPATIONAL THERAPY  Evaluation (12/02/20 1101)    Patient Name:  Lisa Andrews       Medical Record Number: [id number]   Date of Birth: 1950-11-12  Sex: Female          OT Treatment Diagnosis:  Decreased strength, decreased endurance resulting in decreased (I) in self care and functional mobility    Assessment  Problem List: Impaired ADLs, Decreased safety awareness, Decreased endurance, Decreased mobility, Decreased range of motion, Impaired balance, Fall Risk, Decreased strength, Impaired judgement  Clinical Decision Making: Moderate    Assessment:   Lisa Andrews is a 70 y.o. 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 Admitted for suspected end-stage COPD vs. Acute COPD Exacerbation with increased oxygen requirement.     Patient presents to OT with suspected end-stage COPD impacting pt's independence with ADLs and functional mobility/transfers compared to baseline. Patient educated on purpose and goal of OT.  Patient completing bed mobility with Mod I this date. Patient completing donning/doffing socks with Mod A this date. Pt completing sit<>stand with Min A using RW. Pt completed ~30 ft of functional mobility with CGA using RW. Pt exhibiting R knee buckling during functional mobility. Pt performed hand washing task standing at sink with CGA using RW; Patient with forward leaning posture and appearing drousy when performing task. Patient very emotional throughout session and is wanting answers about her current health status. Pt wanting to go home if hospital isnt able to help. Pt exhibiting decreased strength, decreased balance, decreased endurance during self care and functional mobility. Patient to benefit from acute OT to address these deficits.     After review of the patient's occupational profile and history, assessment of occupational performance, clinical decision making, and development of POC, the patient presents as a moderate complexity case. Based on the daily activity AM-PAC raw score of 16/24, the patient is considered to be 53.32% impaired with self care. Recommend post acute 5x/low    Today's Interventions: ADL retraining, Balance activities, Bed mobility, Functional mobility, Endurance activities, Education - Family / caregiver, Education - Patient, Safety education, Teacher, early years/pre, Conservation    Today's Interventions: Am-PAC 16/24, standing balance/tolerance, LB dressing, functional transfers, functional mobility, bed mobiltiy, conservation, safety awareness, pt education    Activity Tolerance During Today's Session  Tolerated treatment well    Plan  Planned Frequency of Treatment:  1-2x per day for: 3-4x week       Planned Interventions:  Adaptive equipment, ADL retraining, Balance activities, Bed mobility, Compensatory tech. training, Conservation, Education - Patient, Functional mobility, Functional cognition, Environmental support, Endurance activities, Education - Family / caregiver, Insurance account manager / Proximal stability, Positioning, Transfer training, UE Strength / coordination exercise, Safety education    Post-Discharge Occupational Therapy Recommendations:   5x weekly, Low intensity   OT DME Recommendations: Defer to post acute -        GOALS:   Patient and Family Goals: To return home    IP Long Term Goal #1: Pt will maximize functional participation in preferred self care routines in 2 weeks.       Short Term:  Pt will complete BSC transfer with Supervision using LRAD.   Time Frame : 1 week  Pt will complete toileting tasks Supervision using LRAD.   Time Frame : 1 week  Pt will complete full-body dressing Min Assist using LRAD and LH AE PRN.   Time Frame : 1 week  Pt will complete +2 grooming tasks seated EOB with setup only, while Independently demonstrating energy conservation techniques.   Time Frame : 1 week           Prognosis:     Positive Indicators:     Barriers to Discharge: Endurance deficits, Inability to safely perform ADLS, Impaired Balance, Functional strength deficits, Severity of deficits    Subjective  Current Status Pt received semi reclined in bed, left sitting EOB, call bell within reach, All needs met, RN aware  Prior Functional Status Pt reports being most Mod I with use of rollator for functional mobility. Pt reports that recently her cousin has been assisting with ADLs at home such as dressing. Pt living with cousin and aunt at this time.    Medical Tests / Procedures: Reviewed in EPIC  Services patient receives: OT    Patient / Caregiver reports: I just want to go home    Past Medical History:   Diagnosis Date   ??? Anxiety and depression    ??? Arthritis    ??? Asthma    ??? At high risk for falls 04/03/2018   ??? Bipolar disorder (CMS-HCC)    ??? COPD bronchitis    ??? Eczema    ??? GERD (gastroesophageal reflux disease)    ??? Headache    ??? Hearing impairment    ??? Hypertension    ??? Impaired mobility    ??? Lung disease    ??? Memory deficit 02/27/2018   ??? Seizures (CMS-HCC)    ??? Thyroid disease     hypothyroid in the past, now euthyroid per patient.   ??? Urinary tract infection     resolved    Social History     Tobacco Use   ??? Smoking status: Current Every Day Smoker     Packs/day: 0.50     Types: Cigarettes   ??? Smokeless tobacco: Never Used   ??? Tobacco comment: Pt smokes roughly 10cpd with intent to quit using NRT   Substance Use Topics   ??? Alcohol use: No     Alcohol/week: 0.0 standard drinks     Comment: quit 20 years ago, was heavy off and on      Past Surgical History:   Procedure Laterality Date   ??? ABDOMINAL HERNIA REPAIR     ??? GALLBLADDER SURGERY     ??? HYSTERECTOMY      TAHBSO   ??? PR UPPER GI ENDOSCOPY,DIAGNOSIS N/A 09/28/2013    Procedure: UGI ENDO, INCLUDE ESOPHAGUS, STOMACH, & DUODENUM &/OR JEJUNUM; DX W/WO COLLECTION SPECIMN, BY BRUSH OR WASH;  Surgeon: Marygrace Drought, MD;  [facility]: [facility];  Service: Gastroenterology   ??? SKIN BIOPSY     ??? TOTAL ABDOMINAL HYSTERECTOMY W/ BILATERAL SALPINGOOPHORECTOMY      Family History   Problem Relation Age of Onset   ??? Arthritis Mother    ??? Dementia Mother    ??? Throat cancer Brother    ??? Diabetes Brother    ??? Cancer Maternal Uncle         Colon   ??? Glaucoma Maternal Grandmother    ??? Mental illness Daughter    ??? Substance Abuse Disorder Daughter    ??? Substance Abuse Disorder Son    ??? Colorectal Cancer Neg Hx    ??? Inflammatory bowel disease Neg Hx    ??? Melanoma  Neg Hx    ??? Basal cell carcinoma Neg Hx    ??? Squamous cell carcinoma Neg Hx         Nsaids (non-steroidal anti-inflammatory drug), Promethazine hcl, Trimethoprim, Aspirin, Lipitor [atorvastatin], Sulfa (sulfonamide antibiotics), and Sulfamethoxazole     Objective Findings  Precautions / Restrictions  Falls precautions    Weight Bearing  Non-applicable    Required Braces or Orthoses  Non-applicable    Communication Preference  Verbal    Pain  No c/o pain reported    Equipment / Environment  Vascular access (PIV, TLC, Port-a-cath, PICC), Supplemental oxygen, Patient not wearing mask for full session (2L [place])    Living Situation  Living Environment: House  Lives With: Family  Home Living: One level home, Stairs to enter with rails, Grab bars around toilet, Standard height toilet, Handicapped height toilet, Walk-in shower, Shower chair without back  Rail placement (outside): Bilateral rails  Number of Stairs to Enter (outside): 4  Equipment available at home: Rollator, Wheelchair-manual, Shower chair without back, Bedside commode     Cognition   Orientation Level:  Oriented x 4   Arousal/Alertness:  Appropriate responses to stimuli   Attention Span:  Appears intact   Memory:  Appears intact   Following Commands:  Follows all commands and directions without difficulty   Safety Judgment:  Good awareness of safety precautions   Awareness of Errors:  Good awareness of safety precautions   Problem Solving:  Able to problem solve independently   Comments:      Vision / Hearing   Vision: Wears glasses for reading only     Hearing: No deficit identified         Hand Function:  Right Hand Function: Right hand grip strength, ROM and coordination WNL  Left Hand Function: Left hand grip strength, ROM and coordination WNL  Hand Dominance: Right    Skin Inspection:  Skin Inspection: Swelling  Skin Inspection comment: Pitting edema on BLE    ROM / Strength:  UE ROM/Strength: Right WFL, Left Impaired/Limited  LUE Impairment: Limited AROM  LE ROM/Strength: Left Impaired/Limited, Right Impaired/Limited  RLE Impairment: Reduced strength  LLE Impairment: Reduced strength  LE ROM/ Strength Comment: R knee buckling during functional mobility with use of RW    Coordination:  Coordination: WFL    Sensation:  RUE Sensation: RUE intact  LUE Sensation: LUE intact  RLE Sensation: RLE intact  LLE Sensation: LLE intact    Balance:  Static sitting: (I), dyanmic sitting: Mod I, Static/dynamic standing: CGA-Min A    Functional Mobility  Transfer Assistance Needed: Yes  Transfers - Needs Assistance: Min assist  Bed Mobility Assistance Needed: No (Mod I with HOB elevated)  Ambulation: Pt completing ~30 ft of functional mobility with CGA using RW; pt demonsrating R knee buckling when moving from sink to EOB.      ADLs  ADLs: Needs assistance with ADLs  ADLs - Needs Assistance: LB dressing  Grooming - Needs Assistance: Set Up Assist, Performed seated  Bathing - Needs Assistance: Mod assist  Toileting - Needs Assistance: Mod assist  UB Dressing - Needs Assistance: Set Up Assist, Performed seated  LB Dressing - Needs Assistance: Mod assist  IADLs: NT      Vitals / Orthostatics  At Rest: SpO2: 98% via 2L [place]  With Activity: SpO2: 97% s/p functional mobility to bathroom<>EOB via 2L [place]  Orthostatics: Asymptomatic      Medical Staff Made Aware: RN Elmarie Shiley  Occupational Therapy Session Duration  OT Individual [mins]: 40         I attest that I have reviewed the above information.  Signed: Brain Hilts, OT  Filed 12/02/2020

## 2020-12-02 NOTE — Unmapped (Addendum)
Lisa Andrews is a 70 yo woman with PMH COPD on 2L home oxygen, bipolar d/o, seizure d/o, hyperthyroidism, borderline pulm HTN, HTN, asthma, presents w/ worsening shortness of breath. Admitted for COPD Exacerbation with increased oxygen requirement. Patient quickly responded to treatment, weaned to home O2 rate, and felt like she returned to her respiratory baseline.         Acute on Chronic COPD   Patient initially with 79% O2 saturation on 2L. In the ED given: azithromycin and ceftriaxone, 3x duoneb, salumedrol and zofran. No URI, worsening cough or sputum production, so abx were not given. CXR consistent with emphysema. Started 5 day prednisone burst. Had a goals of care discussion with patient and she elected to change code status to DNR/DNI. Patient referred to palliative care and hospice. Extensive family meeting arranged to communicate prognosis with family members.   - Predisone taper at discharge  - pulmonology follow-up outpatient     Anxiety & Depression:    Patient found to be profoundly distressed regarding functional decline, uncertain prognosis, and frequent re-hospitalization. Referral to Palliative care at discharge  - prescribed hydroxyzine which patient found helpful with her anxiety symptoms

## 2020-12-02 NOTE — Unmapped (Signed)
Lisa Andrews is A&Ox4. Patient is tearful/anxious this shift. Patient's VS stabled. Patient is currently on 2L of O2 [place]. Pt verbalized that her appetite is poor. Patient is 1 assist to the bathroom. No concerns at this time. Will continue to monitor.  Problem: Adult Inpatient Plan of Care  Goal: Plan of Care Review  Outcome: Progressing  Goal: Patient-Specific Goal (Individualized)  Outcome: Progressing  Goal: Absence of Hospital-Acquired Illness or Injury  Outcome: Progressing  Intervention: Prevent and Manage VTE (Venous Thromboembolism) Risk  Recent Flowsheet Documentation  Taken 12/02/2020 0810 by Lavone Orn, RN  Activity Management: activity adjusted per tolerance  Goal: Optimal Comfort and Wellbeing  Outcome: Progressing  Goal: Readiness for Transition of Care  Outcome: Progressing  Goal: Rounds/Family Conference  Outcome: Progressing     Problem: Self-Care Deficit  Goal: Improved Ability to Complete Activities of Daily Living  Outcome: Progressing     Problem: Asthma Comorbidity  Goal: Maintenance of Asthma Control  Outcome: Progressing     Problem: COPD (Chronic Obstructive Pulmonary Disease) Comorbidity  Goal: Maintenance of COPD Symptom Control  Outcome: Progressing     Problem: Hypertension Comorbidity  Goal: Blood Pressure in Desired Range  Outcome: Progressing     Problem: Seizure Disorder Comorbidity  Goal: Maintenance of Seizure Control  Outcome: Progressing     Problem: Anxiety  Goal: Anxiety Reduction or Resolution  Outcome: Progressing     Problem: Fall Injury Risk  Goal: Absence of Fall and Fall-Related Injury  Outcome: Progressing     Problem: Airway Clearance Ineffective  Goal: Effective Airway Clearance  Outcome: Progressing  Intervention: Promote Airway Secretion Clearance  Recent Flowsheet Documentation  Taken 12/02/2020 0810 by Lavone Orn, RN  Activity Management: activity adjusted per tolerance

## 2020-12-02 NOTE — Unmapped (Cosign Needed)
Family Medicine Inpatient Service    Progress Note    Team: [facility] (pgr [phone])    Hospital Day: 1    ASSESSMENT / PLAN:   Lisa Andrews is a 70 y.o. 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      # COPD Exacerbation vs worsening baseline Sx vs CHF:  GOLD D COPD. Patient with desaturation initially in ED, now satting well on home oxygen. No worsening of cough or sputum, makes exacerbation less likely. Patient with complaints of subjective weakness, new LEE, orthopnea, and DOE. Could be worsening of COPD vs heart failure. Recent ECHO without dysfunction; will get BNP. Will give prednisone burst, but hold on Abx given lack of infectious signs. Using formulary substitutes of home inhalers.   - prednisone 40mg  -6/29   - formulary Breo Ellipta and Incruse Ellipta  - F/u BNP  - Needs f/u with pulm outpatient.     #Anxiety - Depression - Adjustment disorder:  Patient with significant rumination and distress related to her COPD. She feels like she is in precipitous decline, feels like she is barely able to perform ADLs, and is very worried about how her illness will progress. She is also extremely frustrated with her frequent admissions and wishes this would stop. Will consult palliative care for support with psychosocial resources, symptom management, and facilitating goals of care over time.   - home buproprion 300mg  daily.   - Palliative to see on Monday 6/27    #Chest Pain  Less likely ACS with neg trop and normal EKG. Reproducable with palpation consistent with MSK.  - lidocaine patch daily    # Rash  Most consistent with chronic eczema  - hydrocortisone 2.5% topical BID    Chronic Problems:  HTN:??home hydrochlorothiazide 12.5 mg daily. Held losartan for hyperkalemia (may be lab error).   HLD:??Home regimen is rosuvastatin 10mg  nightly. formulary substitution of atorvastatin 20 mg daily.   Hyperthyroidism: Follows with [facility] (last seen 10/16/20). Continue methimazole 2.5mg  daily.  IBS: home dicyclomine 20 mg daily.   GERD: Home regimen includes cimetidine 400mg , dexlansoprazole 60mg  daily, and sucralfate 1g.   Chronic pain:????Continue home gabapentin 600 mg at bedtime??    # Checklist:  - IVF None  - Tubes/Lines/Drains: PIV  - Diet Regular  - Bowel Regimen: Miralax prn  - DVT: SQ Lovenox  - Code Status:   Orders Placed This Encounter   Procedures   ??? Full Code     Standing Status:   Standing     Number of Occurrences:   1     - Dispo: Floor    [ ]  Anticipated Discharge Location: Home vs SNF  [ ]  PT/OT/DME: PT/OT ordered  [ ]  CM/SW needs: Likely pending PT/OT recs  [ ]  Meds/Rx:  Not yet prescribed. No special med needs  [ ]  Teaching: None anticipated  [ ]  Follow up appt: Appointment needed  [ ]  Excuse letter: None anticipated  [ ]  Transport: Private Needed    SUBJECTIVE:  Interval events: Patient with significant distress and rumination on her functional decline and uncertainty of prognosis.       REVIEW OF SYSTEMS:  Pertinent positives and negatives per HPI. A complete review of systems otherwise negative.  PHYSICAL EXAM:      Intake/Output Summary (Last 24 hours) at 12/02/2020 1052  Last data filed at 12/02/2020 0900  Gross per 24 hour   Intake 220 ml   Output 400 ml   Net -180 ml       Recent Vitals:  Vitals:    12/02/20 1026   BP: 123/60   Pulse: 85   Resp:    Temp:    SpO2:        GEN: chronically-ill, but non-toxic, appearing, lying in bed, NAD   HEENT: NCAT, No scleral icterus. Conjunctiva non-erythematous. MMM.  CV: Regular rate and rhythm. No murmurs/rubs/gallops. Pain with palpation of sternum  Pulm: Normal work of breathing on 2L [place]. Inspiratory crackles and expiratory wheezing throughout  Abd: Flat. Voluntary guarding with light palpation to epigastric area and upper abdomen. Normoactive bowel sounds.    Neuro: A&O x 3. Chronic L nasolabial fold blunting. No focal deficits.  Ext: Trace peripheral edema.  Palpable distal pulses.  Skin: Erythematous flank rash       LABS/ STUDIES:    All imaging, laboratory studies, and other pertinent tests including electrocardiography within the last 24 hours were reviewed and are summarized within the assessment and plan.     NUTRITION:                           Jolyn Nap, MD PGY1  December 02, 2020 10:52 AM

## 2020-12-02 NOTE — Unmapped (Signed)
PHYSICAL THERAPY  Evaluation (12/02/20 1155)          Patient Name:?? Lisa Andrews????????   Medical Record Number: [id number]   Date of Birth: Jan 04, 1951  Sex: Female??  ??    Treatment Diagnosis: mobility assessment with deficits in activity tolerance and mobility     Activity Tolerance: Tolerated treatment well     ASSESSMENT  Problem List: Decreased strength, Gait deviation, Decreased endurance, Postural Weakness, Fall Risk, Impaired balance, Decreased mobility, Shortness of breath, Impaired ADLs, Core weakness      Assessment : Lisa Andrews is a 70 y.o. 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 Admitted for suspected end-stage COPD vs. Acute COPD Exacerbation with increased oxygen requirement.      Lisa Andrews presents to skilled Lisa Andrews services with baseline use of 2L [place] and deficits in SOB and activity tolerance impacting her functional mobility and ADLs. Lisa Andrews would benefit from continued Lisa Andrews services to address safety, education and deficits. Based on the AM-PAC 6 item raw score of 18/24, the patient is considered to be 40.47% impaired with basic mobility. This indicates 5x/wk post acute stay     Moderate complexity evaluation completed based on patients PMHx, current deficits and clinical judgement.        Today's Interventions: AMPAC 18/24; Educ re: Lisa Andrews role, Lisa Andrews POC, fall risk reduction, safety and sequencing with mobility, activity pacing/energy conservation, positioning in recliner/bed to assist with SOB, use of tripod positioning of UEs for SOB; transfers, gait                     Clinical Decision Making: Moderate      PLAN  Planned Frequency of Treatment:?? 1-2x per day for: 3-4x week       Planned Interventions: Balance activities, Education - Patient, Endurance activities, Museum/gallery curator, Therapeutic exercise, Therapeutic activity, Transfer training, Functional mobility, Education - Family / caregiver, Investment banker, operational, Home exercise program, Self-care / Home training     Post-Discharge Physical Therapy Recommendations:?? 5x weekly, Low intensity     Lisa Andrews DME Recommendations: Wheelchair??????????       Goals:   Patient and Family Goals: To get stronger and figure out why she is getting weaker.              SHORT GOAL #1: Lisa Andrews will perform all transfers with LRAD supervision  ?????????????????????? Time Frame : 2 weeks  SHORT GOAL #2: Lisa Andrews will ambulate 75 ft with LRAD supervision  ?????????????????????? Time Frame : 2 weeks  SHORT GOAL #3: Lisa Andrews will navigate 4 steps with 1 rail SBA  ?????????????????????? Time Frame : 2 weeks     ??????????????????????       ??????????????????????       Prognosis:?? Fair     Barriers to Discharge: Endurance deficits, Inability to safely perform ADLS, Impaired Balance, Functional strength deficits, Severity of deficits     SUBJECTIVE  Patient reports: Lisa Andrews/RN agreeable to therapy; I know its my emphysema but its getting worse quicker than I thought  Current Functional Status: received/session ended with Lisa Andrews sitting EOB with needs in reach, lines intact-RN aware  Services patient receives: Lisa Andrews, OT  Prior Functional Status: Lisa Andrews reports she has been staying at her cousins and aunts house as her functional tolerance/mobility has continued to decline  Equipment available at home: Rollator, Wheelchair-manual, Shower chair without back, Bedside commode      Past Medical History:   Diagnosis Date   ??? Anxiety and depression    ??? Arthritis    ??? Asthma    ??? At high risk for falls 04/03/2018   ??? Bipolar disorder (CMS-HCC)    ??? COPD bronchitis    ??? Eczema    ??? GERD (gastroesophageal reflux disease)    ??? Headache    ??? Hearing impairment    ??? Hypertension    ??? Impaired mobility    ??? Lung disease    ??? Memory deficit 02/27/2018   ??? Seizures (CMS-HCC)    ??? Thyroid disease     hypothyroid in the past, now euthyroid per patient.   ??? Urinary tract infection     resolved            Social History     Tobacco Use   ??? Smoking status: Current Every Day Smoker     Packs/day: 0.50     Types: Cigarettes   ??? Smokeless tobacco: Never Used   ??? Tobacco comment: Lisa Andrews smokes roughly 10cpd with intent to quit using NRT   Substance Use Topics   ??? Alcohol use: No     Alcohol/week: 0.0 standard drinks     Comment: quit 20 years ago, was heavy off and on       Past Surgical History:   Procedure Laterality Date   ??? ABDOMINAL HERNIA REPAIR     ??? GALLBLADDER SURGERY     ??? HYSTERECTOMY      TAHBSO   ??? PR UPPER GI ENDOSCOPY,DIAGNOSIS N/A 09/28/2013    Procedure: UGI ENDO, INCLUDE ESOPHAGUS, STOMACH, & DUODENUM &/OR JEJUNUM; DX W/WO COLLECTION SPECIMN, BY BRUSH OR WASH;  Surgeon: Marygrace Drought, MD;  [facility];  Service: Gastroenterology   ??? SKIN BIOPSY     ??? TOTAL ABDOMINAL HYSTERECTOMY W/ BILATERAL SALPINGOOPHORECTOMY               Family History   Problem Relation Age of Onset   ??? Arthritis Mother    ??? Dementia Mother    ??? Throat cancer Brother    ??? Diabetes Brother    ??? Cancer Maternal Uncle         Colon   ??? Glaucoma Maternal Grandmother    ??? Mental illness Daughter    ??? Substance Abuse Disorder Daughter    ??? Substance Abuse Disorder Son    ??? Colorectal Cancer Neg Hx    ??? Inflammatory bowel disease Neg Hx    ??? Melanoma Neg Hx    ??? Basal cell carcinoma Neg Hx    ??? Squamous cell carcinoma Neg Hx         Allergies: Nsaids (non-steroidal anti-inflammatory drug), Promethazine hcl, Trimethoprim, Aspirin, Lipitor [atorvastatin], Sulfa (sulfonamide antibiotics), and Sulfamethoxazole                  Objective Findings  Precautions / Restrictions  Precautions: Falls precautions  Weight Bearing Status: Non-applicable  Required Braces or Orthoses: Non-applicable     Communication Preference: Verbal          Pain Comments: reports chest pain  Medical Tests / Procedures: chart review per epic  Equipment / Environment: Vascular access (PIV, TLC, Port-a-cath, PICC), Supplemental oxygen, Patient not wearing mask for full session     At Rest: VSS per epic, RN cleared spO2 on 2L [place] 95%  With Activity: spO2 on 2L [place] 94%           Living Situation  Living Environment: House  Lives With: Family  Home Living: One level home, Stairs to enter with rails, Grab bars around toilet, Standard height toilet, Handicapped height toilet, Walk-in shower, Shower chair without back  Rail placement (outside): Bilateral rails  Number of Stairs to Enter (outside): 4      Cognition: WFL  Visual/Perception: Within Functional Limits           Upper Extremities  UE ROM: Right WFL, Left WFL  UE Strength: Right WFL, Left WFL    Lower Extremities  LE ROM: Right WFL, Left WFL  LE Strength: Right Impaired/Limited, Left Impaired/Limited  RLE Strength Impairment: Reduced strength  LLE Strength Impairment: Reduced strength  LE comment: h/o OA R>L     Balance comment: sitting EOB indep; standing with RW CGA      Bed Mobility: deferred bed mobility- Lisa Andrews reporting she has been sleeping in a chair or side of bed to assist with her breathing     Transfer comments: sit<>stand with RW from EOB CGA with therapist managing lines and tele      Gait: Lisa Andrews ambulated 1x45 ft with RW CGA demo slow step to gait pattern with forward flexed posture/use of tripod positioning of UEs to assist with SOB; seated rest break x5 minutes for recovery                        Physical Therapy Session Duration  Lisa Andrews Individual [mins]: 36     Medical Staff Made Aware: RN     I attest that I have reviewed the above information.  Signed: Lonzo Andrews, Lisa Andrews  Filed 12/02/2020

## 2020-12-02 NOTE — Unmapped (Signed)
Family Medicine Inpatient Service    History and Physical Note    Team: Family Medicine [facility] (pgr [phone])    PCP: Othelia Pulling, MD  Date of Admission: December 01, 2020  Code Status: full code  Emergency Contact: Extended Emergency Contact Information  Primary Emergency Contact: Mamie Nick [place]  Home Phone: [phone]  Mobile Phone: [phone]  Relation: Brother     ASSESSMENT / PLAN:   Lisa Andrews is a 70 y.o. 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 Admitted for suspected end-stage COPD vs. Acute COPD Exacerbation with increased oxygen requirement.      # Acute on Chronic COPD  End Stage COPD vs Acute on Chronic COPD: Pt with hx of COPD and asthma presenting with acute worsening in shortness of breath, increased oxygen requirement from 2L to 3L. On exam, 1+ pitting edema to bilateral lower extremities to ankles, with expiratory wheezing present bilaterally on lung exam. Pt almost syncopized going to the bathroom in the ED, otherwise hemodynamically stable. In the ED given: azithromycin and ceftriaxone, 3x duoneb, salumedrol and zofran. No URI, worsening cough or sputum production, abx not recommended. Covid neg, Normal WBC, noted to have Absolute Neutrophil ct of 8.6. Ankle swelling, Most recent echo did not show heart failure, CXR did not show pulm edema, hold off on PO lasix for now. Recent echo 6/6 showing EF 70%, no pulmonary HTN, recent CT chest showing some nodules requires f/u CT in 6 months.  - Starting prednisone PO 40x5d  - Order ProBNP, can consider POCUS to assess for B Lines.   - Resume home inhalers breo ellipta and incruse ellipta as hospital alternative to home inhaler, hx of poor response to duoneb  - Goals of Care Discussion, begin palliative care discussion.   - Needs f/u with pulm outpatient.     #Chest Pain  Discomfort, non-radiating, DDx includes ACS, COPD, MSK, PE, PNA. COPD most likely given exam and lab findings. Troponin 4. EKG: NSR.  - F/u ProBNP    #Anxiety & Depression:??  Patient had become tearful several times during interview expressed that she was depressed and concerned for her prognosis. Reflected that her health is severely declining and expressed frustration that her family had no insight to her illness.   - Continue home buproprion 300mg  daily.   - Goals of Care Discussion, may benefit from outpatient therapy, give resources at discharge.    # FEN/GI:  - IVF None  - Check electrolytes as indicated, replete as needed.  - Diet Regular    # PPX:   - DVT: Lovenox 40mg  daily    Chronic Problems:  HTN:??Continue home hydrochlorothiazide 12.5 mg daily and losartan 100 mg daily.  HLD:??Home regimen is rosuvastatin 10mg  nightly. formulary substitution of atorvastatin 20 mg daily.   Hyperthyroidism: Follows with [facility] (last seen 10/16/20). Continue methimazole 2.5mg  daily.  IBS: home dicyclomine 20 mg daily.   GERD: Home regimen includes cimetidine 400mg , dexlansoprazole 60mg  daily, and sucralfate 1g.   Chronic pain:  Continue home gabapentin 600 mg at bedtime     # Healthcare Maintenance:  Health Maintenance Due   Topic Date Due   ??? Colonoscopy  08/28/2018   ??? Hemoglobin A1c  04/08/2020     # Dispo: Floor  HISTORY OF PRESENT ILLNESS:  Lisa Andrews is a 70 y.o. female with PMHx seizure DO, tobacco use, GERD, bipolar DO, asthma, COPD on 2 L home O2, HTN, hyperthyroidism, who presents with 1wk hx of shortness of breath, no fevers. Diffuse wheezing, coarse crackles with frequent pausing to take deep breaths. ED CXR Remarkable for lung findings of emphysema, covid test negative. Last hospitalization for COPD Exacerbation 11/13/2020. Throughout interview, patient became tearful and expressed concern for her COPD prognosis and how death is imminent.     Patient reports DOE, CP with exertion that has been going on for a while. Doesn't know why it keeps happening. Chest pain is described as a bad ache that almost felt like a heart attack. Chest felt heavy. No radiation. No fevers, chills. +generalized weakness and pain but also endorses a hx of chronic back pain.   Feels like she is progressing towards dying, has felt progressive worsening of her symptoms.     Hx of tobacco use since age 74, smoking about >50 pack year smoking hx.   Has sinus problems and sinus HA; can't breath well with the [place] in     Has to sleep sitting up straight     ED Course: Presented with tachypnea, O2 Saturation of 79% on 2L [place], with suspected COPD Exacerbation. Pt almost syncopized going to the bathroom in the ED, otherwise hemodynamically stable. In the ED given: azithromycin and ceftriaxone, 3x duoneb, salumedrol and zofran.  CBC and BMP pending.     PAST MEDICAL / SURGICAL HX:  Past Medical History:   Diagnosis Date   ??? Anxiety and depression    ??? Arthritis    ??? Asthma    ??? At high risk for falls 04/03/2018   ??? Bipolar disorder (CMS-HCC)    ??? COPD bronchitis    ??? Eczema    ??? GERD (gastroesophageal reflux disease)    ??? Headache    ??? Hearing impairment    ??? Hypertension    ??? Impaired mobility    ??? Lung disease    ??? Memory deficit 02/27/2018   ??? Seizures (CMS-HCC)    ??? Thyroid disease     hypothyroid in the past, now euthyroid per patient.   ??? Urinary tract infection     resolved     Past Surgical History:   Procedure Laterality Date   ??? ABDOMINAL HERNIA REPAIR     ??? GALLBLADDER SURGERY     ??? HYSTERECTOMY      TAHBSO   ??? PR UPPER GI ENDOSCOPY,DIAGNOSIS N/A 09/28/2013    Procedure: UGI ENDO, INCLUDE ESOPHAGUS, STOMACH, & DUODENUM &/OR JEJUNUM; DX W/WO COLLECTION SPECIMN, BY BRUSH OR WASH;  Surgeon: Marygrace Drought, MD;  [facility];  Service: Gastroenterology   ??? SKIN BIOPSY     ??? TOTAL ABDOMINAL HYSTERECTOMY W/ BILATERAL SALPINGOOPHORECTOMY FAMILY HX:   Family History   Problem Relation Age of Onset   ??? Arthritis Mother    ??? Dementia Mother    ??? Throat cancer Brother    ??? Diabetes Brother    ??? Cancer Maternal Uncle         Colon   ??? Glaucoma Maternal Grandmother    ??? Mental illness Daughter    ??? Substance Abuse Disorder Daughter    ??? Substance Abuse Disorder Son    ??? Colorectal Cancer Neg Hx    ??? Inflammatory bowel disease Neg Hx    ??? Melanoma Neg Hx    ??? Basal  cell carcinoma Neg Hx    ??? Squamous cell carcinoma Neg Hx      SOCIAL HX:  Social History     Socioeconomic History   ??? Marital status: Legally Separated   Tobacco Use   ??? Smoking status: Current Every Day Smoker     Packs/day: 0.50     Types: Cigarettes   ??? Smokeless tobacco: Never Used   ??? Tobacco comment: Pt smokes roughly 10cpd with intent to quit using NRT   Vaping Use   ??? Vaping Use: Former   Substance and Sexual Activity   ??? Alcohol use: No     Alcohol/week: 0.0 standard drinks     Comment: quit 20 years ago, was heavy off and on   ??? Drug use: No   Other Topics Concern   ??? Do you use sunscreen? No   ??? Tanning bed use? No   ??? Are you easily burned? No   ??? Excessive sun exposure? No   ??? Blistering sunburns? No   Social History Narrative    05-11-15 - Daughter and grandson living with her because they have no where else to go.         01/31/16        PCMH Components:    ??    Family, social, cultural characteristics: daughter .      Patient has the following communication needs: vision issue wears glasses    Health Literacy: How confident are you that you understand your health issues/concerns, can participate in your care, and manage your care along with your physician: confident.    Behaviors Affecting Health: poor dietary choices.     Have you been seen by any medical provider that we have not referred you to since your last visit ? No    Discussed a Living Will with the patient and the : Patient interested in education materials about a living will. Materials provided at today's visit.         12-09-16 Daughter who was doing drugs has now moved out of her home. Her grandson is moving out with his girlfriend soon.      Social Determinants of Health     Financial Resource Strain: Low Risk    ??? Difficulty of Paying Living Expenses: Not hard at all   Food Insecurity: Food Insecurity Present   ??? Worried About Programme researcher, broadcasting/film/video in the Last Year: Sometimes true   ??? Ran Out of Food in the Last Year: Sometimes true   Transportation Needs: No Transportation Needs   ??? Lack of Transportation (Medical): No   ??? Lack of Transportation (Non-Medical): No   Stress: Stress Concern Present   ??? Feeling of Stress : Rather much     MEDICATIONS / ALLERGIES:  Medications Prior to Admission   Medication Sig Dispense Refill Last Dose   ??? gabapentin (NEURONTIN) 300 MG capsule TAKE 1 CAPSULE BY MOUTH TWICE A DAY & TAKE 2 CAPSULES AT BEDTIME 360 capsule 0    ??? albuterol 2.5 mg /3 mL (0.083 %) nebulizer solution Inhale 2.5 mg by nebulization every four (4) hours as needed for wheezing.      ??? albuterol HFA 90 mcg/actuation inhaler Inhale 2 puffs every four (4) hours as needed.       ??? buPROPion (WELLBUTRIN XL) 300 MG 24 hr tablet Take 300 mg by mouth every morning.       ??? busPIRone (BUSPAR) 5 MG tablet Take 5 mg by mouth two (2)  times a day.      ??? cetirizine (ZYRTEC) 10 MG tablet Take 1 tablet by mouth daily.      ??? cholecalciferol, vitamin D3, 1,000 unit capsule Take 2,000 Units by mouth daily.       ??? cimetidine (TAGAMET) 400 MG tablet Take 400 mg by mouth daily.      ??? clobetasoL (TEMOVATE) 0.05 % cream Apply twice a day to affected areas of body.  Avoid face and folds 60 g 6    ??? cyanocobalamin, vitamin B-12, (VITAMIN B-12 ORAL) Take by mouth daily.      ??? dexlansoprazole 60 mg capsule Take 60 mg by mouth daily. 90 capsule 3    ??? dicyclomine (BENTYL) 20 mg tablet Take 20 mg by mouth Three (3) times a day. ONLY TAKING 2 A DAY 20 tablet 1    ??? [EXPIRED] dupilumab (DUPIXENT PEN) 300 mg/2 mL PnIj Inject the contents of 2 pens (600 mg) under the skin once for loading. 4 mL 0    ??? dupilumab 300 mg/2 mL PnIj Inject the contents of one pen (300 mg) under the skin every two weeks as maintenance. 4 mL 11    ??? empty container Misc Use as directed to dispose of Dupixent pens. 1 each 2    ??? fluticasone furoate-vilanterol (BREO ELLIPTA) 100-25 mcg/dose inhaler Inhale 1 puff daily.      ??? hydroCHLOROthiazide (HYDRODIURIL) 12.5 MG tablet Take 12.5 mg by mouth daily.      ??? levalbuterol (XOPENEX) 0.63 mg/3 mL nebulizer solution Inhale 1 ampule by nebulization every four (4) hours as needed for wheezing.      ??? linaclotide (LINZESS) 145 mcg capsule Take 145 mcg by mouth.      ??? losartan (COZAAR) 100 MG tablet TAKE 1 TABLET BY MOUTH ONCE DAILY 90 tablet 1    ??? meTHIMazole (TAPAZOLE) 5 MG tablet Take 2.5 mg by mouth daily.       ??? nicotine (NICODERM CQ) 14 mg/24 hr patch Place 1 patch on the skin in the morning for 42 doses. On quit day begin by applying 14-mg patch daily for six weeks. 42 patch 0    ??? polyethylene glycol (GLYCOLAX) 17 gram/dose powder 17 gm (one capful) with 8 ounces of liquid 510 g 12    ??? sucralfate (CARAFATE) 1 gram tablet Take 1 g by mouth Four (4) times a day.      ??? tiotropium (SPIRIVA WITH HANDIHALER) 18 mcg inhalation capsule PLACE 1 CAPSULE IN INHALER AND INHALE ONCE DAILY      ??? traMADoL (ULTRAM) 50 mg tablet Take 50 mg by mouth as needed in the morning.      ??? triamcinolone (KENALOG) 0.1 % cream APPLY TO AFFECTED AREA ON BACK TWICE DAILY FOR 2 WEEKS OR UNTIL RESOLVED 454 g 2      Allergies   Allergen Reactions   ??? Nsaids (Non-Steroidal Anti-Inflammatory Drug)      Pt with severe GERD/pud, med interaction   ??? Promethazine Hcl      Medication Interaction   ??? Trimethoprim      rash   ??? Aspirin Nausea And Vomiting     PUD and nausea with aspirin and NSAIDS   ??? Lipitor [Atorvastatin] Muscle Pain   ??? Sulfa (Sulfonamide Antibiotics) Rash   ??? Sulfamethoxazole Rash     REVIEW OF SYSTEMS:  Pertinent positives and negatives per HPI. A complete review of systems otherwise negative    PHYSICAL EXAM:    Initial  ED Vitals:   ED Triage Vitals   Enc Vitals Group      BP 12/01/20 1301 129/101      Heart Rate 12/01/20 1301 103      SpO2 Pulse 12/01/20 1601 104      Resp 12/01/20 1301 18      Temp 12/01/20 1301 37 ??C      Temp Source 12/01/20 1301 Oral      SpO2 12/01/20 1301 98 %      Weight --       Height --       Head Circumference --       Peak Flow --       Pain Score --       Pain Loc --       Pain Edu? --       Excl. in GC? --      Recent Vitals:  Vitals:    12/01/20 2346   BP:    Pulse:    Resp:    Temp:    SpO2: 97%     GEN: tearful, sitting up in bed, NAD   Eyes: No scleral icterus. Conjunctiva non-erythematous. EOMI  HEENT: NCAT, MMM. Oropharynx clear.  Neck: Supple.  Lymphadenopathy: No cervical or supraclavicular LAD.  CV: Regular rate and rhythm. No murmurs/rubs/gallops. Bilateral nail clubbing present.  Pulm: Expiratory wheezing, no crackles, or rhonchi.  Abd: Flat.  Nontender. No guarding, rebound.  Normoactive bowel sounds.    Neuro: A&O x 3. No focal deficits. Strength 5/5 UE/LE. Distal sensation to light touch intact.  Ext: 1+ pitting edema bilaterally to ankles.  Palpable distal pulses.  Skin: eczematic skin lesions on right back.      LABS/ STUDIES:    Recent Labs   Lab Units 12/01/20  1854   WBC 10*9/L 9.1   HEMOGLOBIN g/dL 16.1   HEMATOCRIT % 09.6   MCV fL 87.4   PLATELET COUNT (1) 10*9/L 195     Recent Labs   Lab Units 12/01/20  1854   SODIUM mmol/L 142   POTASSIUM mmol/L 4.2   CHLORIDE mmol/L 110*   CO2 mmol/L 26.8   BUN mg/dL 6*   CREATININE mg/dL 0.45*   GLUCOSE mg/dL 409   CALCIUM mg/dL 9.2   ALBUMIN g/dL 3.3*   ALT U/L 14   AST U/L 16   ALK PHOS U/L 84   BILIRUBIN TOTAL mg/dL 0.3   PROTEIN TOTAL g/dL 6.4     No results in the last week    Recent Labs   Lab Units 12/01/20  1854   HSTNI ng/L 4     IMAGING:   XR Chest Portable   Final Result      *Lung findings of emphysema with no acute cardiopulmonary abnormality. MICRO:  None.    EKG:   Encounter Date: 12/01/20   ECG 12 Lead   Result Value    EKG Systolic BP     EKG Diastolic BP     EKG Ventricular Rate 88    EKG Atrial Rate 88    EKG P-R Interval 162    EKG QRS Duration 68    EKG Q-T Interval 364    EKG QTC Calculation 440    EKG Calculated P Axis 87    EKG Calculated R Axis 5    EKG Calculated T Axis 48    QTC Fredericia 413    Narrative    NORMAL SINUS RHYTHM  CANNOT RULE OUT INFERIOR  INFARCT , AGE UNDETERMINED  ABNORMAL ECG  WHEN COMPARED WITH ECG OF 01-Dec-2020 13:11,  MINIMAL CRITERIA FOR INFERIOR INFARCT ARE NOW PRESENT     Juanda Chance, MD PGY1  December 01, 2020 6:52 PM

## 2020-12-02 NOTE — Unmapped (Signed)
Problem: Adult Inpatient Plan of Care  Goal: Plan of Care Review  12/02/2020 0635 by Hilarie Fredrickson, RRT  Outcome: Progressing       Problem: Airway Clearance Ineffective  Goal: Effective Airway Clearance  Outcome: Progressing   Pt remains on 2L [place], PRN xopenex given for SOB and congestion, educated patient on acapella for mucus mobilization.  MD aware RT care given this shift.

## 2020-12-03 MED ORDER — HYDROXYZINE HCL 25 MG TABLET
ORAL_TABLET | Freq: Three times a day (TID) | ORAL | 1 refills | 10.00000 days | Status: CP | PRN
Start: 2020-12-03 — End: ?

## 2020-12-03 MED ORDER — HYDROCORTISONE 2.5 % TOPICAL CREAM
Freq: Two times a day (BID) | TOPICAL | 0 refills | 0 days | Status: CP
Start: 2020-12-03 — End: 2021-12-03

## 2020-12-03 MED ADMIN — hydroCHLOROthiazide (HYDRODIURIL) tablet 12.5 mg: 12.5 mg | ORAL | @ 13:00:00 | Stop: 2020-12-03

## 2020-12-03 MED ADMIN — pantoprazole (PROTONIX) EC tablet 40 mg: 40 mg | ORAL | @ 13:00:00 | Stop: 2020-12-03

## 2020-12-03 MED ADMIN — umeclidinium (INCRUSE ELLIPTA) 62.5 mcg/actuation inhaler 1 puff: 1 | RESPIRATORY_TRACT | @ 14:00:00 | Stop: 2020-12-03

## 2020-12-03 MED ADMIN — dicyclomine (BENTYL) tablet 20 mg: 20 mg | ORAL | @ 18:00:00 | Stop: 2020-12-03

## 2020-12-03 MED ADMIN — dicyclomine (BENTYL) tablet 20 mg: 20 mg | ORAL | @ 13:00:00 | Stop: 2020-12-03

## 2020-12-03 MED ADMIN — predniSONE (DELTASONE) tablet 40 mg: 40 mg | ORAL | @ 13:00:00 | Stop: 2020-12-03

## 2020-12-03 MED ADMIN — enoxaparin (LOVENOX) syringe 40 mg: 40 mg | SUBCUTANEOUS | @ 13:00:00 | Stop: 2020-12-03

## 2020-12-03 MED ADMIN — cetirizine (ZyrTEC) tablet 10 mg: 10 mg | ORAL | @ 13:00:00 | Stop: 2020-12-03

## 2020-12-03 MED ADMIN — gabapentin (NEURONTIN) capsule 200 mg: 200 mg | ORAL | @ 01:00:00

## 2020-12-03 MED ADMIN — hydrOXYzine (ATARAX) tablet 25 mg: 25 mg | ORAL | @ 02:00:00

## 2020-12-03 MED ADMIN — methIMAzole (TAPAZOLE) split tablet 2.5 mg: 2.5 mg | ORAL | @ 13:00:00 | Stop: 2020-12-03

## 2020-12-03 MED ADMIN — nicotine (NICODERM CQ) 14 mg/24 hr patch 1 patch: 1 | TRANSDERMAL | @ 02:00:00

## 2020-12-03 MED ADMIN — fluticasone furoate-vilanteroL (BREO ELLIPTA) 100-25 mcg/dose inhaler 1 puff: 1 | RESPIRATORY_TRACT | @ 14:00:00 | Stop: 2020-12-03

## 2020-12-03 MED ADMIN — cholecalciferol (vitamin D3 25 mcg (1,000 units)) tablet 25 mcg: 25 ug | ORAL | @ 14:00:00 | Stop: 2020-12-03

## 2020-12-03 MED ADMIN — hydrocortisone 2.5 % cream 1 application: 1 | TOPICAL | @ 13:00:00 | Stop: 2020-12-03

## 2020-12-03 MED ADMIN — hydrocortisone 2.5 % cream 1 application: 1 | TOPICAL | @ 01:00:00

## 2020-12-03 MED ADMIN — buPROPion (WELLBUTRIN XL) 24 hr tablet 300 mg: 300 mg | ORAL | @ 10:00:00 | Stop: 2020-12-03

## 2020-12-03 MED ADMIN — famotidine (PEPCID) tablet 40 mg: 40 mg | ORAL | @ 13:00:00 | Stop: 2020-12-03

## 2020-12-03 MED ADMIN — dicyclomine (BENTYL) tablet 20 mg: 20 mg | ORAL | @ 01:00:00

## 2020-12-03 NOTE — Unmapped (Signed)
Will continue to monitor pt  Problem: Adult Inpatient Plan of Care  Goal: Plan of Care Review  Outcome: Progressing  Goal: Patient-Specific Goal (Individualized)  Outcome: Progressing  Goal: Absence of Hospital-Acquired Illness or Injury  Outcome: Progressing  Intervention: Identify and Manage Fall Risk  Recent Flowsheet Documentation  Taken 12/02/2020 2000 by Anders Simmonds, RN  Safety Interventions:   low bed   lighting adjusted for tasks/safety   bed alarm  Goal: Optimal Comfort and Wellbeing  Outcome: Progressing  Goal: Readiness for Transition of Care  Outcome: Progressing  Goal: Rounds/Family Conference  Outcome: Progressing     Problem: Self-Care Deficit  Goal: Improved Ability to Complete Activities of Daily Living  Outcome: Progressing     Problem: Asthma Comorbidity  Goal: Maintenance of Asthma Control  Outcome: Progressing     Problem: COPD (Chronic Obstructive Pulmonary Disease) Comorbidity  Goal: Maintenance of COPD Symptom Control  Outcome: Progressing     Problem: Hypertension Comorbidity  Goal: Blood Pressure in Desired Range  Outcome: Progressing     Problem: Seizure Disorder Comorbidity  Goal: Maintenance of Seizure Control  Outcome: Progressing     Problem: Anxiety  Goal: Anxiety Reduction or Resolution  Outcome: Progressing     Problem: Fall Injury Risk  Goal: Absence of Fall and Fall-Related Injury  Outcome: Progressing  Intervention: Promote Injury-Free Environment  Recent Flowsheet Documentation  Taken 12/02/2020 2000 by Anders Simmonds, RN  Safety Interventions:   low bed   lighting adjusted for tasks/safety   bed alarm     Problem: Airway Clearance Ineffective  Goal: Effective Airway Clearance  Outcome: Progressing

## 2020-12-03 NOTE — Unmapped (Signed)
Discharge teaching and instructions given. Verbalized understanding. IV removed and wnl.     Problem: Adult Inpatient Plan of Care  Goal: Plan of Care Review  12/03/2020 1614 by Emiliano Dyer, RN  Outcome: Discharged to Home  12/03/2020 1614 by Emiliano Dyer, RN  Outcome: Progressing  Goal: Patient-Specific Goal (Individualized)  12/03/2020 1614 by Emiliano Dyer, RN  Outcome: Discharged to Home  12/03/2020 1614 by Emiliano Dyer, RN  Outcome: Progressing  Goal: Absence of Hospital-Acquired Illness or Injury  12/03/2020 1614 by Emiliano Dyer, RN  Outcome: Discharged to Home  12/03/2020 1614 by Emiliano Dyer, RN  Outcome: Progressing  Intervention: Identify and Manage Fall Risk  Recent Flowsheet Documentation  Taken 12/03/2020 1600 by Emiliano Dyer, RN  Safety Interventions:   fall reduction program maintained   infection management   low bed   nonskid shoes/slippers when out of bed  Taken 12/03/2020 1400 by Emiliano Dyer, RN  Safety Interventions:   fall reduction program maintained   infection management   low bed   nonskid shoes/slippers when out of bed  Taken 12/03/2020 1200 by Emiliano Dyer, RN  Safety Interventions:   fall reduction program maintained   infection management   low bed   nonskid shoes/slippers when out of bed  Taken 12/03/2020 1000 by Emiliano Dyer, RN  Safety Interventions:   fall reduction program maintained   infection management   low bed   nonskid shoes/slippers when out of bed  Taken 12/03/2020 0800 by Emiliano Dyer, RN  Safety Interventions:   fall reduction program maintained   infection management   low bed   nonskid shoes/slippers when out of bed  Intervention: Prevent and Manage VTE (Venous Thromboembolism) Risk  Recent Flowsheet Documentation  Taken 12/03/2020 0800 by Emiliano Dyer, RN  Activity Management: activity adjusted per tolerance  Intervention: Prevent Infection  Recent Flowsheet Documentation  Taken 12/03/2020 1600 by Emiliano Dyer, RN  Infection Prevention:   cohorting utilized   environmental surveillance performed   equipment surfaces disinfected   hand hygiene promoted   personal protective equipment utilized   rest/sleep promoted   single patient room provided  Taken 12/03/2020 1400 by Emiliano Dyer, RN  Infection Prevention:   cohorting utilized   environmental surveillance performed   equipment surfaces disinfected   hand hygiene promoted   rest/sleep promoted   personal protective equipment utilized   single patient room provided  Taken 12/03/2020 1200 by Emiliano Dyer, RN  Infection Prevention:   cohorting utilized   environmental surveillance performed   equipment surfaces disinfected   hand hygiene promoted   personal protective equipment utilized   rest/sleep promoted   single patient room provided  Taken 12/03/2020 1000 by Emiliano Dyer, RN  Infection Prevention:   cohorting utilized   environmental surveillance performed   equipment surfaces disinfected   hand hygiene promoted   personal protective equipment utilized   rest/sleep promoted   single patient room provided  Taken 12/03/2020 0800 by Emiliano Dyer, RN  Infection Prevention:   cohorting utilized   environmental surveillance performed   equipment surfaces disinfected   hand hygiene promoted   personal protective equipment utilized   rest/sleep promoted   single patient room provided  Goal: Optimal Comfort and Wellbeing  12/03/2020 1614 by Emiliano Dyer, RN  Outcome: Discharged to Home  12/03/2020 1614 by Emiliano Dyer, RN  Outcome: Progressing  Goal: Readiness for Transition  of Care  12/03/2020 1614 by Emiliano Dyer, RN  Outcome: Discharged to Home  12/03/2020 1614 by Emiliano Dyer, RN  Outcome: Progressing  Goal: Rounds/Family Conference  12/03/2020 1614 by Emiliano Dyer, RN  Outcome: Discharged to Home  12/03/2020 1614 by Emiliano Dyer, RN  Outcome: Progressing     Problem: Self-Care Deficit  Goal: Improved Ability to Complete Activities of Daily Living  12/03/2020 1614 by Emiliano Dyer, RN  Outcome: Discharged to Home  12/03/2020 1614 by Emiliano Dyer, RN  Outcome: Progressing     Problem: Asthma Comorbidity  Goal: Maintenance of Asthma Control  12/03/2020 1614 by Emiliano Dyer, RN  Outcome: Discharged to Home  12/03/2020 1614 by Emiliano Dyer, RN  Outcome: Progressing     Problem: COPD (Chronic Obstructive Pulmonary Disease) Comorbidity  Goal: Maintenance of COPD Symptom Control  12/03/2020 1614 by Emiliano Dyer, RN  Outcome: Discharged to Home  12/03/2020 1614 by Emiliano Dyer, RN  Outcome: Progressing     Problem: Hypertension Comorbidity  Goal: Blood Pressure in Desired Range  12/03/2020 1614 by Emiliano Dyer, RN  Outcome: Discharged to Home  12/03/2020 1614 by Emiliano Dyer, RN  Outcome: Progressing     Problem: Seizure Disorder Comorbidity  Goal: Maintenance of Seizure Control  12/03/2020 1614 by Emiliano Dyer, RN  Outcome: Discharged to Home  12/03/2020 1614 by Emiliano Dyer, RN  Outcome: Progressing     Problem: Anxiety  Goal: Anxiety Reduction or Resolution  12/03/2020 1614 by Emiliano Dyer, RN  Outcome: Discharged to Home  12/03/2020 1614 by Emiliano Dyer, RN  Outcome: Progressing     Problem: Fall Injury Risk  Goal: Absence of Fall and Fall-Related Injury  12/03/2020 1614 by Emiliano Dyer, RN  Outcome: Discharged to Home  12/03/2020 1614 by Emiliano Dyer, RN  Outcome: Progressing  Intervention: Promote Injury-Free Environment  Recent Flowsheet Documentation  Taken 12/03/2020 1600 by Emiliano Dyer, RN  Safety Interventions:   fall reduction program maintained   infection management   low bed   nonskid shoes/slippers when out of bed  Taken 12/03/2020 1400 by Emiliano Dyer, RN  Safety Interventions:   fall reduction program maintained   infection management   low bed   nonskid shoes/slippers when out of bed  Taken 12/03/2020 1200 by Emiliano Dyer, RN  Safety Interventions:   fall reduction program maintained   infection management low bed   nonskid shoes/slippers when out of bed  Taken 12/03/2020 1000 by Emiliano Dyer, RN  Safety Interventions:   fall reduction program maintained   infection management   low bed   nonskid shoes/slippers when out of bed  Taken 12/03/2020 0800 by Emiliano Dyer, RN  Safety Interventions:   fall reduction program maintained   infection management   low bed   nonskid shoes/slippers when out of bed     Problem: Airway Clearance Ineffective  Goal: Effective Airway Clearance  12/03/2020 1614 by Emiliano Dyer, RN  Outcome: Discharged to Home  12/03/2020 1614 by Emiliano Dyer, RN  Outcome: Progressing  Intervention: Promote Airway Secretion Clearance  Recent Flowsheet Documentation  Taken 12/03/2020 0800 by Emiliano Dyer, RN  Activity Management: activity adjusted per tolerance

## 2020-12-03 NOTE — Unmapped (Signed)
ADVANCE CARE PLANNING NOTE    Discussion Date:  December 03, 2020    Patient has decisional capacity:  Yes    Patient has selected a Health Care Decision-Maker if loses capacity: No    Health Care Decision Maker as of 12/03/2020      Discussion Participants:  Patient, Dr. Purnell Shoemaker     Communication of Medical Status/Prognosis:   Extensive discussion with patient regarding underlying severity of her COPD. Patient endorses increasing frequency of exacerbations, inability to return to previous baseline after each exacerbation, and frustration with repeat visits to the hospital for COPD exacerbations. Has had discussions with her Pulmonologist that this is end stage COPD, and that there are not other therapeutic options that he would recommend.     Communication of Treatment Goals/Options:   Patient reports that she wants to be able to talk openly and honestly about the fact that this condition will not get better. She would like to optimize her quality of life and spend as much time with her family at home as possible. Patient also understands that if she were to be sick enough to require intubation, that she would have a very poor prognosis at that point due to her underlying lung disease. Patient would like to continue receiving steroids and antibiotics for COPD exacerbations, preferentially at home if possible, and would like inpatient care if those interventions have not relieved her shortness of breath. She would prefer to die at home rather than in the hospital, though since she has left her previous long term home this is not a high priority for her.     Treatment Decisions:   Patient will be referred to outpatient Palliative Care and Hospice  Patient will continue to receive standard treatment for COPD exacerbations, including inpatient care if needed  Patient has elected to change code status to DNR/DNI      I spent 45 minutes providing voluntary advance care planning services for this patient.

## 2020-12-04 LAB — PRO-BNP: PRO-BNP: 158 pg/mL — ABNORMAL HIGH (ref 0.0–125.0)

## 2020-12-04 MED ORDER — PREDNISONE 20 MG TABLET
ORAL_TABLET | ORAL | 0 refills | 12.00000 days | Status: CP
Start: 2020-12-04 — End: 2020-12-16

## 2020-12-04 NOTE — Unmapped (Signed)
Physician Discharge Summary [facility]  [facility]  [address]  [place] [address]  Dept: [phone]  Loc: [phone]     Identifying Information:   LANIER FELTY  [birth year]  [id number]    Primary Care Physician: Othelia Pulling, MD   Code Status: DNR and DNI    Admit Date: 12/01/2020    Discharge Date: 12/03/2020     Discharge To: Home    Discharge Service: [facility]     Discharge Attending Physician: No att. providers found    Discharge Diagnoses:  Principal Problem:    COPD with acute exacerbation (CMS-HCC)  Active Problems:    Tobacco use    Essential hypertension    GERD (gastroesophageal reflux disease)    Hyperthyroidism    Asthma    Mixed hyperlipidemia  Resolved Problems:    * No resolved hospital problems. *    Hospital Course:   Natalee Tomkiewicz is a 70 yo woman with PMH COPD on 2L home oxygen, bipolar d/o, seizure d/o, hyperthyroidism, borderline pulm HTN, HTN, asthma, presents w/ worsening shortness of breath. Admitted for COPD Exacerbation with increased oxygen requirement. Patient quickly responded to treatment, weaned to home O2 rate, and felt like she returned to her respiratory baseline.         Acute on Chronic COPD   Patient initially with 79% O2 saturation on 2L. In the ED given: azithromycin and ceftriaxone, 3x duoneb, salumedrol and zofran. No URI, worsening cough or sputum production, so abx were not given. CXR consistent with emphysema. Started 5 day prednisone burst. Had a goals of care discussion with patient and she elected to change code status to DNR/DNI. Patient referred to palliative care and hospice. Extensive family meeting arranged to communicate prognosis with family members.   - Predisone taper at discharge    Anxiety & Depression:    Patient found to be profoundly distressed regarding functional decline, uncertain prognosis, and frequent re-hospitalization. Referral to Palliative care at discharge  - prescribed hydroxyzine which patient found helpful with her anxiety symptoms     ______________________________________________________________________  Discharge Medications:     Your Medication List      STOP taking these medications    busPIRone 5 MG tablet  Commonly known as: BUSPAR        START taking these medications    hydrocortisone 2.5 % cream  Apply 1 application topically Two (2) times a day.     hydrOXYzine 25 MG tablet  Commonly known as: ATARAX  Take 1 tablet (25 mg total) by mouth every eight (8) hours as needed for anxiety (anxiety or stress related insomnia).     predniSONE 20 MG tablet  Commonly known as: DELTASONE  Take 2 tablets (40 mg total) by mouth daily for 3 days, THEN 1.5 tablets (30 mg total) daily for 3 days, THEN 1 tablet (20 mg total) daily for 3 days, THEN 0.5 tablets (10 mg total) daily for 3 days.  Start taking on: December 04, 2020        CONTINUE taking these medications    albuterol 2.5 mg /3 mL (0.083 %) nebulizer solution  Inhale 2.5 mg by nebulization every four (4) hours as needed for wheezing.     albuterol 90 mcg/actuation inhaler  Commonly known as: PROVENTIL HFA;VENTOLIN HFA  Inhale 2 puffs every four (4) hours as needed.     buPROPion 300 MG 24 hr tablet  Commonly known as: WELLBUTRIN XL  Take 300 mg  by mouth every morning.     cetirizine 10 MG tablet  Commonly known as: ZyrTEC  Take 1 tablet by mouth daily.     cholecalciferol (vitamin D3-25 mcg (1,000 unit)) 25 mcg (1,000 unit) capsule  Take 2,000 Units by mouth daily.     cimetidine 400 MG tablet  Commonly known as: TAGAMET  Take 400 mg by mouth daily.     clobetasoL 0.05 % cream  Commonly known as: TEMOVATE  Apply twice a day to affected areas of body.  Avoid face and folds     dexlansoprazole 60 mg capsule  Take 60 mg by mouth daily.     dicyclomine 20 mg tablet  Commonly known as: BENTYL  Take 20 mg by mouth Three (3) times a day. ONLY TAKING 2 A DAY     DUPIXENT PEN 300 mg/2 mL Pnij  Generic drug: dupilumab  Inject the contents of one pen (300 mg) under the skin every two weeks as maintenance.     empty container Misc  Use as directed to dispose of Dupixent pens.     fluticasone furoate-vilanteroL 100-25 mcg/dose inhaler  Commonly known as: BREO ELLIPTA  Inhale 1 puff daily.     gabapentin 300 MG capsule  Commonly known as: NEURONTIN  TAKE 1 CAPSULE BY MOUTH TWICE A DAY & TAKE 2 CAPSULES AT BEDTIME     hydroCHLOROthiazide 12.5 MG tablet  Commonly known as: HYDRODIURIL  Take 12.5 mg by mouth daily.     levalbuterol 0.63 mg/3 mL nebulizer solution  Commonly known as: XOPENEX  Inhale 1 ampule by nebulization every four (4) hours as needed for wheezing.     linaCLOtide 145 mcg capsule  Commonly known as: LINZESS  Take 145 mcg by mouth.     losartan 100 MG tablet  Commonly known as: COZAAR  TAKE 1 TABLET BY MOUTH ONCE DAILY     methIMAzole 5 MG tablet  Commonly known as: TAPAZOLE  Take 2.5 mg by mouth daily.     nicotine 14 mg/24 hr patch  Commonly known as: NICODERM CQ  Place 1 patch on the skin in the morning for 42 doses. On quit day begin by applying 14-mg patch daily for six weeks.     polyethylene glycol 17 gram/dose powder  Commonly known as: GLYCOLAX  17 gm (one capful) with 8 ounces of liquid     SPIRIVA WITH HANDIHALER 18 mcg inhalation capsule  Generic drug: tiotropium  PLACE 1 CAPSULE IN INHALER AND INHALE ONCE DAILY     sucralfate 1 gram tablet  Commonly known as: CARAFATE  Take 1 g by mouth Four (4) times a day.     traMADoL 50 mg tablet  Commonly known as: ULTRAM  Take 50 mg by mouth as needed in the morning.     triamcinolone 0.1 % cream  Commonly known as: KENALOG  APPLY TO AFFECTED AREA ON BACK TWICE DAILY FOR 2 WEEKS OR UNTIL RESOLVED     VITAMIN B-12 ORAL  Take by mouth daily.        ASK your doctor about these medications    DUPIXENT PEN 300 mg/2 mL Pnij  Generic drug: dupilumab  Inject the contents of 2 pens (600 mg) under the skin once for loading.  Ask about: Should I take this medication?            Allergies:  Nsaids (non-steroidal anti-inflammatory drug), Promethazine hcl, Trimethoprim, Aspirin, Lipitor [atorvastatin], Sulfa (sulfonamide antibiotics), and Sulfamethoxazole  ______________________________________________________________________  Pending  Test Results (if blank, then none):  Pending Labs     Order Current Status    Pro-BNP In process        ______________________________________________________________________  Discharge Instructions:     Other Instructions     Discharge instructions      You were admitted for a COPD exacerbation. You quickly improved to your baseline oxygen needs and breathing with treatment. We will have you continue a prednisone taper with 3 days each of 40 mg, 30 mg, 20 mg, and 10 mg before resuming your normal 5 mg daily dose. We have also sent prescriptions for hydroxyzine which is helpful for episodes of anxiety and for stress/anxiety-related insomnia. We have also sent for some additional topical steroid cream which you can put on your rash twice daily for up to 2 weeks. We've sent referral for palliative care (symptom-focused specialists) and to hospice to try to get you additional resources for meeting your goals of care at home. We will help coordinate a follow-up with your PCP. Please call your doctor or return to the ED if your symptoms worsen. Thank you and please take care!          Resources and Referrals    For additional assistance finding food or other resources in your community contact [facility].    By phone: DIAL 2-1-1 Or [phone]    Or Check them out online at [email or web address]                    Follow Up instructions and Outpatient Referrals     Ambulatory referral to Hospice      Facility Type: Home-based    Ambulatory referral to Palliative Care      Discharge instructions          Appointments which have been scheduled for you    Dec 27, 2020  2:00 PM  (Arrive by 1:45 PM)  RETURN  GENERAL with Ashwath Noland Fordyce, MD  [facility] REGION) [address]  [place] [address]  [phone]        Feb 22, 2021  2:20 PM  (Arrive by 2:05 PM)  OFFICE VISIT with Marca Ancona, MD  [facility] REGION) [address] [place] [address]  [phone]        May 14, 2021  1:20 PM  (Arrive by 1:05 PM)  NEW GENERAL SPECIALIST with Bartholomew Boards, MD  [facility] REGION) [address]  [place] [address]  [phone]             ______________________________________________________________________  Discharge Day Services:  BP 127/54  - Pulse 91  - Temp 37 ??C (Temporal)  - Resp 20  - Ht 159.8 cm (5' 2.91)  - Wt 77.4 kg (170 lb 10.2 oz)  - SpO2 99%  - BMI 30.31 kg/m??   Pt seen on the day of discharge and determined appropriate for discharge.    GEN: well appearing, sitting up in bed, NAD   HEENT: NCAT, MMM. EOMI.  CV: Regular rate and rhythm. No murmurs/rubs/gallops.  Pulm: Diffuse wheezing with prolonged expiratory phase, no apparent respiratory distress  Abd: Flat.  Nontender. No guarding, rebound.  Normoactive bowel sounds.    Neuro: A&O x 3. No focal deficits.   Ext: No peripheral edema.  Palpable distal pulses.    Condition at Discharge: stable  Length of Discharge: I spent greater than 30 mins in the discharge of this patient.    Berna Spare, MD  Family Medicine PGY-2

## 2020-12-05 MED ORDER — CLOBETASOL 0.05 % TOPICAL CREAM
OPHTHALMIC | 6 refills | 0.00000 days | Status: CP
Start: 2020-12-05 — End: ?
  Filled 2020-12-08: qty 60, 15d supply, fill #0

## 2020-12-05 NOTE — Unmapped (Signed)
Rx request Clobetasol  Last OV: 09/25/20  Rx pending for approval

## 2020-12-05 NOTE — Unmapped (Signed)
Lisa Andrews was recently hospitalized for COPD exacerbation. 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 She reported that her hospitalists weren't sure if she should continue Dupixent or not - we reviewed it's not immunosuppressive, so should be safe in the setting of COPD.     She has seen improvement in rash on arms/legs, but does continue to have flares on her back. She hasn't been using clobetasol, so I encouraged regular use of this for the next few weeks until her follow up (Refill requested).    We reviewed that her Atarax can also help with itching.     [facility] Clinical Assessment & Refill Coordination Note    KARALEE Andrews, DOB: 08-24-1950  Phone: [phone] (home)     All above HIPAA information was verified with patient.     Was a Nurse, learning disability used for this call? No    Specialty Medication(s):   Inflammatory Disorders: Dupixent     Current Outpatient Medications   Medication Sig Dispense Refill   ??? gabapentin (NEURONTIN) 300 MG capsule TAKE 1 CAPSULE BY MOUTH TWICE A DAY & TAKE 2 CAPSULES AT BEDTIME 360 capsule 0   ??? albuterol 2.5 mg /3 mL (0.083 %) nebulizer solution Inhale 2.5 mg by nebulization every four (4) hours as needed for wheezing.     ??? albuterol HFA 90 mcg/actuation inhaler Inhale 2 puffs every four (4) hours as needed.      ??? buPROPion (WELLBUTRIN XL) 300 MG 24 hr tablet Take 300 mg by mouth every morning.      ??? cetirizine (ZYRTEC) 10 MG tablet Take 1 tablet by mouth daily.     ??? cholecalciferol, vitamin D3, 1,000 unit capsule Take 2,000 Units by mouth daily.      ??? cimetidine (TAGAMET) 400 MG tablet Take 400 mg by mouth daily.     ??? clobetasoL (TEMOVATE) 0.05 % cream Apply twice a day to affected areas of body.  Avoid face and folds 60 g 6   ??? cyanocobalamin, vitamin B-12, (VITAMIN B-12 ORAL) Take by mouth daily.     ??? dexlansoprazole 60 mg capsule Take 60 mg by mouth daily. 90 capsule 3   ??? dicyclomine (BENTYL) 20 mg tablet Take 20 mg by mouth Three (3) times a day. ONLY TAKING 2 A DAY 20 tablet 1   ??? dupilumab 300 mg/2 mL PnIj Inject the contents of one pen (300 mg) under the skin every two weeks as maintenance. 4 mL 11   ??? empty container Misc Use as directed to dispose of Dupixent pens. 1 each 2   ??? fluticasone furoate-vilanterol (BREO ELLIPTA) 100-25 mcg/dose inhaler Inhale 1 puff daily.     ??? hydroCHLOROthiazide (HYDRODIURIL) 12.5 MG tablet Take 12.5 mg by mouth daily.     ??? hydrocortisone 2.5 % cream Apply 1 application topically Two (2) times a day. 30 g 0   ??? hydrOXYzine (ATARAX) 25 MG tablet Take 1 tablet (25 mg total) by mouth every eight (8) hours as needed for anxiety (anxiety or stress related insomnia). 30 tablet 1   ??? levalbuterol (XOPENEX) 0.63 mg/3 mL nebulizer solution Inhale 1 ampule by nebulization every four (4) hours as needed for wheezing.     ??? linaclotide (LINZESS) 145 mcg capsule Take 145 mcg by mouth.     ??? losartan (COZAAR) 100 MG tablet TAKE 1 TABLET BY MOUTH ONCE DAILY 90 tablet 1   ??? meTHIMazole (TAPAZOLE) 5 MG tablet Take 2.5 mg by mouth daily.      ???  nicotine (NICODERM CQ) 14 mg/24 hr patch Place 1 patch on the skin in the morning for 42 doses. On quit day begin by applying 14-mg patch daily for six weeks. 42 patch 0   ??? polyethylene glycol (GLYCOLAX) 17 gram/dose powder 17 gm (one capful) with 8 ounces of liquid 510 g 12   ??? predniSONE (DELTASONE) 20 MG tablet Take 2 tablets (40 mg total) by mouth daily for 3 days, THEN 1.5 tablets (30 mg total) daily for 3 days, THEN 1 tablet (20 mg total) daily for 3 days, THEN 0.5 tablets (10 mg total) daily for 3 days. 15 tablet 0   ??? sucralfate (CARAFATE) 1 gram tablet Take 1 g by mouth Four (4) times a day.     ??? tiotropium (SPIRIVA WITH HANDIHALER) 18 mcg inhalation capsule PLACE 1 CAPSULE IN INHALER AND INHALE ONCE DAILY     ??? traMADoL (ULTRAM) 50 mg tablet Take 50 mg by mouth as needed in the morning.     ??? triamcinolone (KENALOG) 0.1 % cream APPLY TO AFFECTED AREA ON BACK TWICE DAILY FOR 2 WEEKS OR UNTIL RESOLVED 454 g 2     No current facility-administered medications for this visit.        Changes to medications: Charnese reports no changes at this time.    Allergies   Allergen Reactions   ??? Nsaids (Non-Steroidal Anti-Inflammatory Drug)      Pt with severe GERD/pud, med interaction   ??? Promethazine Hcl      Medication Interaction   ??? Trimethoprim      rash   ??? Aspirin Nausea And Vomiting     PUD and nausea with aspirin and NSAIDS   ??? Lipitor [Atorvastatin] Muscle Pain   ??? Sulfa (Sulfonamide Antibiotics) Rash   ??? Sulfamethoxazole Rash       Changes to allergies: No    SPECIALTY MEDICATION ADHERENCE     Dupixent - 0 left  Medication Adherence    Patient reported X missed doses in the last month: 0  Specialty Medication: Dupixent          Specialty medication(s) dose(s) confirmed: Regimen is correct and unchanged.     Are there any concerns with adherence? No    Adherence counseling provided? Not needed    CLINICAL MANAGEMENT AND INTERVENTION      Clinical Benefit Assessment:    Do you feel the medicine is effective or helping your condition? No    Clinical Benefit counseling provided? Reasonable expectations discussed: May take ~8 weeks for full effect    Adverse Effects Assessment:    Are you experiencing any side effects? No    Are you experiencing difficulty administering your medicine? yes, but having cousin that works in health care help administer    Quality of Life Assessment:    How many days over the past month did your AD  keep you from your normal activities? For example, brushing your teeth or getting up in the morning. Patient declined to answer    Have you discussed this with your provider? Not needed    Acute Infection Status:    Acute infections noted within Epic:  No active infections  Patient reported infection: None    Therapy Appropriateness:    Is therapy appropriate? Yes, therapy is appropriate and should be continued    DISEASE/MEDICATION-SPECIFIC INFORMATION      For patients on injectable medications: Patient currently has 0 doses left.  Next injection is scheduled for 7/12.  PATIENT SPECIFIC NEEDS     - Does the patient have any physical, cognitive, or cultural barriers? No    - Is the patient high risk? No    - Does the patient require a Care Management Plan? No     - Does the patient require physician intervention or other additional services (i.e. nutrition, smoking cessation, social work)? No      SHIPPING     Specialty Medication(s) to be Shipped:   Inflammatory Disorders: Dupixent    Other medication(s) to be shipped: clobetasol requested     Changes to insurance: No    Delivery Scheduled: Yes, Expected medication delivery date: Friday, 7/1.     Medication will be delivered via Same Day Courier to the confirmed prescription address in [facility].    The patient will receive a drug information handout for each medication shipped and additional FDA Medication Guides as required.  Verified that patient has previously received a Conservation officer, historic buildings and a Surveyor, mining.    The patient or caregiver noted above participated in the development of this care plan and knows that they can request review of or adjustments to the care plan at any time.      All of the patient's questions and concerns have been addressed.    Lanney Gins   [facility] Specialty Pharmacist

## 2020-12-06 ENCOUNTER — Encounter: Admit: 2020-12-06 | Discharge: 2020-12-07 | Payer: MEDICARE

## 2020-12-06 MED ORDER — HYDROCORTISONE 2.5 % TOPICAL CREAM
Freq: Two times a day (BID) | TOPICAL | 0 days
Start: 2020-12-06 — End: ?

## 2020-12-06 MED ORDER — TRIAMCINOLONE ACETONIDE 0.1 % TOPICAL CREAM
Freq: Two times a day (BID) | TOPICAL | 0 days | PRN
Start: 2020-12-06 — End: ?

## 2020-12-06 MED ORDER — POLYETHYLENE GLYCOL 3350 17 GRAM/DOSE ORAL POWDER
Freq: Every day | ORAL | 0 days | PRN
Start: 2020-12-06 — End: ?

## 2020-12-06 MED ORDER — GABAPENTIN 300 MG CAPSULE
Freq: Three times a day (TID) | ORAL | 0 days
Start: 2020-12-06 — End: ?

## 2020-12-06 MED ORDER — HYDROXYZINE HCL 25 MG TABLET
Freq: Three times a day (TID) | ORAL | 0 days | PRN
Start: 2020-12-06 — End: ?

## 2020-12-06 MED ORDER — PREDNISONE 20 MG TABLET
Freq: Every day | ORAL | 0 days
Start: 2020-12-06 — End: ?

## 2020-12-06 MED ORDER — LOSARTAN 100 MG TABLET
Freq: Every day | ORAL | 0 days
Start: 2020-12-06 — End: ?

## 2020-12-06 MED ORDER — CLOBETASOL 0.05 % TOPICAL CREAM
Freq: Two times a day (BID) | TOPICAL | 0 days
Start: 2020-12-06 — End: ?

## 2020-12-08 ENCOUNTER — Encounter: Admit: 2020-12-08 | Discharge: 2020-12-10 | Payer: MEDICARE

## 2020-12-08 MED FILL — DUPIXENT 300 MG/2 ML SUBCUTANEOUS PEN INJECTOR: SUBCUTANEOUS | 28 days supply | Qty: 4 | Fill #1

## 2020-12-10 NOTE — Unmapped (Signed)
1610  On call phone note    Patrecia Pour [phone] called to request a visit. Caller states pt is anxious and having difficulty breathing. Caller states neb treatments have been administered, oxygen is on and head of bed is elevated. This RN attempted to give instruction on medications - suggested to use hydroxizine for anxiety - Doris responded i don't know anything about the meds or what she is taking, can u just send someone over OC RN Antonieta Loveless notified.     Ivin Poot RN

## 2020-12-17 MED ORDER — PREDNISONE 5 MG TABLET
Freq: Every day | ORAL | 0 days
Start: 2020-12-17 — End: ?

## 2020-12-22 ENCOUNTER — Telehealth: Payer: Self-pay | Admitting: Internal Medicine

## 2020-12-22 MED ORDER — ALBUTEROL SULFATE HFA 108 (90 BASE) MCG/ACT IN AERS: 2.0000 | INHALATION_SPRAY | Freq: Four times a day (QID) | RESPIRATORY_TRACT | 2 refills | Status: AC | PRN

## 2020-12-22 MED ORDER — CETIRIZINE HCL 10 MG PO TABS: 10.0000 mg | ORAL_TABLET | Freq: Every day | ORAL | 6 refills | Status: AC

## 2020-12-22 MED ORDER — PREDNISONE 5 MG PO TABS
5.0000 mg | ORAL_TABLET | Freq: Every day | ORAL | 1 refills | Status: AC
Start: 2020-12-22 — End: ?

## 2020-12-22 NOTE — Unmapped (Signed)
[facility] pharmacy called RE losartan. Notified them that patient has active RX with [facility] pharmacy and was ordered 12/06/20    Pharmacist states the patient is transferring from [facility] pharmacy to [place] and wanted to make sure the med list was correct.    Reviewed meds and verified with pharmacist at [facility].  No further questions noted.

## 2020-12-22 NOTE — Telephone Encounter (Signed)
This encounter was created in error - please disregard.

## 2020-12-22 NOTE — Telephone Encounter (Signed)
Rx has been sent to preferred pharmacy.  Nothing further needed at this time.

## 2020-12-22 NOTE — Telephone Encounter (Signed)
Spoke to Morgan Farm with [facility], who is requesting Rx for prednisone 5mg , albuterol HFA and zyrtec. Patient is currently in the process of switching pharmacies. Rx for prednisone and albuterol has been sent to preferred pharmacy.  Dr. Mortimer Fries, please advise on Zyrtec? Thanks

## 2020-12-22 NOTE — Telephone Encounter (Signed)
Patient calling for refills on Gabapentin and Tramadol.  Your last note in March says that the patient will call when she needs refills.

## 2020-12-22 NOTE — Telephone Encounter (Signed)
Judy Erickson from [facility] is calling in regards too the refill for; Prednisone and Cetirizine.  Pharmacy; [address], [place], [place] [address]  Pls regard; [phone]

## 2020-12-26 NOTE — Unmapped (Signed)
Dermatology Note     Assessment and Plan:      Eczematous dermatitis, flaring  -Prior biopsy with subacute spongiotic dermatitis  -Previously well controlled with methotrexate however patient discontinued this after resolution of her dermatitis  -Reviewed treatment options including increasing strength of topical steroids versus oral prednisone versus methotrexate restart  Patient prefers to maintain her current topical regimen as she feels these have been most helpful previously  - START clobetasol twice daily to lesions as needed (prescribed today)   - START Hydroxyzine 25 mg every 8 hours (prescribed today)  - Patient is currently on Dupixent. States that she has not noticed any difference. Explained to take another month of the shots and will reassess. Would consider re-starting methotrexate.     High risk medication (on dupixent)     The patient was advised to call for an appointment should any new, changing, or symptomatic lesions develop.     RTC: Return in about 1 month (around 01/27/2021). or sooner as needed   _________________________________________________________________      Chief Complaint     Chief Complaint   Patient presents with   ??? Follow-up     Eczema, pt states the dupixent is not helping       HPI     Lisa Andrews is a 70 y.o. female who presents as a returning patient (last seen 09/25/2020) to [facility] for a rash.  She has had this rash for many years and has a biopsy from June 2019 which revealed subacute spongiotic dermatitis.  Previously managed with topical steroids, prednisone, and low-dose methotrexate.  Now on dupixent and states that it is not working well. She has completed 5 injections but noticed spreading on her bilateral upper extremities and trunk.     The patient denies any other new or changing lesions or areas of concern.     Pertinent Past Medical History     Past Medical History:   Diagnosis Date   ??? Anxiety and depression    ??? Arthritis    ??? Asthma    ??? At high risk for falls 04/03/2018   ??? Bipolar disorder (CMS-HCC)    ??? COPD bronchitis    ??? Eczema    ??? GERD (gastroesophageal reflux disease)    ??? Headache    ??? Hearing impairment    ??? Hypertension    ??? Impaired mobility    ??? Lung disease    ??? Memory deficit 02/27/2018   ??? Seizures (CMS-HCC)    ??? Thyroid disease     hypothyroid in the past, now euthyroid per patient.   ??? Urinary tract infection     resolved       Past Medical History, Family History, Social History, Medication List, Allergies, and Problem List were reviewed in the rooming section of Epic.     ROS: Other than symptoms mentioned in the HPI, no fevers, chills, or other skin complaints    Physical Examination     GENERAL: Well-appearing female in no acute distress, resting comfortably.  NEURO: Alert and oriented, answers questions appropriately  SKIN (Full Skin Exam): Examination of the face, eyelids, lips, nose, ears, neck, chest, abdomen, back, arms, legs, hands, feet, palms, soles, nails was performed  -Scattered ill-defined erythematous scaly papules and plaques and trunk and extremities with excoriations     All areas not commented on are within normal limits or unremarkable      (Approved Template 02/21/2020)

## 2020-12-27 ENCOUNTER — Ambulatory Visit
Admit: 2020-12-27 | Discharge: 2020-12-28 | Payer: MEDICARE | Attending: Student in an Organized Health Care Education/Training Program | Primary: Student in an Organized Health Care Education/Training Program

## 2020-12-27 DIAGNOSIS — Z79899 Other long term (current) drug therapy: Principal | ICD-10-CM

## 2020-12-27 DIAGNOSIS — L309 Dermatitis, unspecified: Principal | ICD-10-CM

## 2020-12-27 MED ORDER — CLOBETASOL 0.05 % TOPICAL OINTMENT
Freq: Two times a day (BID) | TOPICAL | 3 refills | 0.00000 days | Status: CP
Start: 2020-12-27 — End: 2021-12-27
  Filled 2021-01-15: qty 60, 15d supply, fill #1

## 2020-12-27 MED ORDER — HYDROXYZINE HCL 25 MG TABLET
ORAL_TABLET | Freq: Three times a day (TID) | ORAL | 1 refills | 10.00000 days | Status: CP | PRN
Start: 2020-12-27 — End: ?

## 2020-12-27 NOTE — Unmapped (Signed)
Is a phone visit ok with you to do for this patient?

## 2020-12-27 NOTE — Unmapped (Signed)
I discussed this patient's case including the history, clinical findings and assessment and plan with the resident physician. I reviewed the resident's note. I agree with the plan as documented in the resident's note. I was immediately available.     Eidan Muellner V Arizbeth Cawthorn, MD

## 2020-12-28 NOTE — Unmapped (Signed)
VM from [facility] regarding clobetasoL (TEMOVATE) 0.05 % ointment.  Pt requests to have a cream ordered instead of the ointment.

## 2020-12-28 NOTE — Unmapped (Signed)
Dr. Odis Luster and Dr. Mingo Amber   Who schedule does it need to go on so I can add it there? And what date and time would work if Dr. Mingo Amber is doing a phone visit? I can see when Dr. Mingo Amber next office date in [place] and add it when he is seeing patients out there but Please advise on what you both would like for me to do.      Thank you both very much!    Morrie Sheldon

## 2020-12-29 ENCOUNTER — Other Ambulatory Visit: Payer: Self-pay

## 2020-12-29 ENCOUNTER — Encounter: Payer: Self-pay | Admitting: Adult Health

## 2020-12-29 ENCOUNTER — Ambulatory Visit (INDEPENDENT_AMBULATORY_CARE_PROVIDER_SITE_OTHER): Payer: Medicare Other | Admitting: Adult Health

## 2020-12-29 VITALS — BP 130/70 | HR 91 | Temp 97.6°F | Ht 64.0 in | Wt 168.8 lb

## 2020-12-29 DIAGNOSIS — J9611 Chronic respiratory failure with hypoxia: Secondary | ICD-10-CM | POA: Diagnosis not present

## 2020-12-29 DIAGNOSIS — R5381 Other malaise: Secondary | ICD-10-CM

## 2020-12-29 DIAGNOSIS — Z720 Tobacco use: Secondary | ICD-10-CM | POA: Diagnosis not present

## 2020-12-29 DIAGNOSIS — J449 Chronic obstructive pulmonary disease, unspecified: Secondary | ICD-10-CM

## 2020-12-29 MED ORDER — MECLIZINE HCL 12.5 MG PO TABS: 12.5000 mg | ORAL_TABLET | Freq: Three times a day (TID) | ORAL | 4 refills | Status: AC | PRN

## 2020-12-29 NOTE — Unmapped (Signed)
Dr. Odis Luster   Do you want to still do the telephone visit or a video visit with patient? Please advise   Thanks  Fara Boros

## 2020-12-29 NOTE — Progress Notes (Signed)
$'@Patient'X$  ID: Judy Erickson, female    DOB: 10-Jul-1950, 70 y.o.   MRN: [id number]  Chief Complaint  Patient presents with   Follow-up    Referring provider: Verdie Shire, MD  HPI: 70 year old female active smoker followed for severe COPD with emphysema and chronic respiratory failure on oxygen, on chronic prednisone 5 mg daily, lung nodule Prone to frequent COPD exacerbations Medical history significant for bipolar disorder, seizure disorder TEST/EVENTS :  CT chest September 14, 2020 showed emphysema, tiny scattered pulmonary nodules.  7.9 mm irregular posterior left upper lobe nodule with plans for repeat CT chest in 6 months.  Care everywhere notes [facility] spirometry 2019 showed FEV1 49%, ratio 52, FVC 76%  12/29/2020 Follow up : COPD and Post hospital follow up  Patient presents for a follow-up visit.  Patient was recently hospitalized last month for COPD exacerbation.  Care everywhere notes were reviewed she was treated with IV antibiotics, nebulized bronchodilators and steroids.  Discharged on a steroid burst.  She was referred to palliative care and hospice.  Since discharge patient says that she remains very weak.  Gets short of breath with minimum activities.  She did decline hospice care.  Patient is very concerned today about her prognosis.  She gets short of breath with minimum activities.  Has dyspnea at rest.  Remains on oxygen 2 L.  She is currently on prednisone 5 mg.  Has a daily cough and congestion.  She does continue to smoke.  We discussed smoking cessation and the dangers of smoking in her home with oxygen. Spirometry today shows decrease in lung function FEV1 at 29%, ratio 40, FVC 54%.  We discussed her results in detail with notable decline in lung function since 2019.  Also she has a significant functional decline and is prone to frequent hospitalizations. Remains on BREO , Spiriva, Daliresp , prednisone '5mg'$  , Xopenex neb Four times a day  .  Moved in with her  aunt so she would not have to go to nursing home. Was unable to stay by herself.  Says she has trouble lying down with her breathing has to prop up, needs hospital bed.    Allergies  Allergen Reactions   Aspirin Nausea And Vomiting    PUD and nausea with aspirin and NSAIDS PUD and nausea with aspirin and NSAIDS    Atorvastatin Other (See Comments)   Nsaids    Promethazine Hcl    Sulfa Antibiotics     Immunization History  Administered Date(s) Administered   Influenza Split 03/12/2016   Influenza, High Dose Seasonal PF 03/09/2015, 03/25/2017   Influenza, Seasonal, Injecte, Preservative Fre 04/01/2007, 03/31/2008, 04/24/2010, 02/18/2012   Influenza,inj,Quad PF,6+ Mos 03/10/2013, 03/17/2014, 02/26/2018, 02/23/2019, 02/15/2020   Influenza-Unspecified 04/29/2003, 04/22/2008, 03/10/2013, 03/17/2014, 03/25/2017   Moderna Sars-Covid-2 Vaccination 08/25/2019, 09/22/2019, 04/14/2020   Pneumococcal Conjugate-13 10/27/2014   Pneumococcal Polysaccharide-23 01/31/2000, 05/11/2007, 04/30/2013, 01/11/2020   Tdap 10/23/2006, 05/24/2011   Zoster Recombinat (Shingrix) 01/20/2018, 06/24/2018   Zoster, Live 01/18/2014    Past Medical History:  Diagnosis Date   Abdominal hernia    Allergic rhinitis due to allergen    Arthritis    Asthma    Bell's palsy    Bipolar disorder ([place])    bipolar affect, depressed   COPD (chronic obstructive pulmonary disease) (HCC)    Depression    Eczema    Genital warts    Genital warts    GERD (gastroesophageal reflux disease)    Headache    migraines  History of migraine headaches    Hypertension    benign   Osteoporosis    Pulmonary nodules    subcentimeter   Seizures (HCC)    Sleep apnea    Thyroid disease    Tobacco use disorder    Tremor    Vitamin B12 deficiency    Vitamin D deficiency     Tobacco History: Social History   Tobacco Use  Smoking Status Former   Packs/day: 2.00   Years: 55.00   Pack years: 110.00   Types: Cigarettes    Quit date: 08/2020   Years since quitting: 0.3  Smokeless Tobacco Never  Tobacco Comments   1 pack per week7/22/2022   Counseling given: Not Answered Tobacco comments: 1 pack per week7/22/2022   Outpatient Medications Prior to Visit  Medication Sig Dispense Refill   acetaminophen (TYLENOL) 500 MG tablet Take 1,000 mg by mouth.     albuterol (PROVENTIL) (2.5 MG/3ML) 0.083% nebulizer solution Take 2.5 mg by nebulization every 6 (six) hours as needed for wheezing or shortness of breath.     albuterol (VENTOLIN HFA) 108 (90 Base) MCG/ACT inhaler Inhale 2 puffs into the lungs every 6 (six) hours as needed for wheezing or shortness of breath. 18 g 2   buPROPion (WELLBUTRIN XL) 300 MG 24 hr tablet Take 300 mg by mouth daily.     busPIRone (BUSPAR) 5 MG tablet Take 5 mg by mouth 2 (two) times daily.     cetirizine (ZYRTEC ALLERGY) 10 MG tablet Take 1 tablet (10 mg total) by mouth daily. 30 tablet 6   cetirizine (ZYRTEC ALLERGY) 10 MG tablet Take 1 tablet (10 mg total) by mouth at bedtime. 30 tablet 6   cimetidine (TAGAMET) 400 MG tablet Take 400 mg by mouth at bedtime.     clobetasol ointment (TEMOVATE) [id number] % Apply 1 application topically 2 (two) times daily.     dexlansoprazole (DEXILANT) 60 MG capsule Take 60 mg by mouth daily.     dicyclomine (BENTYL) 20 MG tablet Take 20 mg by mouth every 6 (six) hours.     fluticasone (FLONASE) 50 MCG/ACT nasal spray Place 1 spray into both nostrils daily. 16 g 2   fluticasone furoate-vilanterol (BREO ELLIPTA) 100-25 MCG/INH AEPB INHALE 1 INHALATION INTO THE LUNGS ONCE DAILY     gabapentin (NEURONTIN) 300 MG capsule Take 300 mg by mouth 3 (three) times daily.     hydrochlorothiazide (HYDRODIURIL) 12.5 MG tablet Take 12.5 mg by mouth daily.     HYDROcodone-homatropine (HYCODAN) 5-1.5 MG/5ML syrup Take by mouth.     ipratropium (ATROVENT) 0.06 % nasal spray Place 2 sprays into both nostrils 4 (four) times daily.     levalbuterol (XOPENEX) 0.63 MG/3ML  nebulizer solution INHALE 1 VIAL (3ML) BY NEBULIZATION 4 TIMES DAILY 120 mL 4   Linaclotide (LINZESS) 145 MCG CAPS capsule Take 145 mcg by mouth daily.     LORazepam (ATIVAN) 1 MG tablet Take 1 mg by mouth every 8 (eight) hours. Take 1/2 tablet by mouth three times a day as needed     losartan (COZAAR) 100 MG tablet Take 100 mg by mouth daily.     methimazole (TAPAZOLE) 5 MG tablet Take 2.5 mg by mouth daily.     ondansetron (ZOFRAN) 4 MG tablet Take 4 mg by mouth every 8 (eight) hours as needed for nausea or vomiting.     predniSONE (DELTASONE) 20 MG tablet Take 1 tablet (20 mg total) by mouth daily with breakfast. 10 days  14 tablet 1   predniSONE (DELTASONE) 5 MG tablet Take 1 tablet (5 mg total) by mouth daily. 90 tablet 1   Roflumilast (DALIRESP) 250 MCG TABS Take 1 tablet by mouth daily. 28 tablet 11   rosuvastatin (CRESTOR) 10 MG tablet Take 10 mg by mouth at bedtime.     sucralfate (CARAFATE) 1 g tablet Take 1 g by mouth 4 (four) times daily -  with meals and at bedtime.     tiotropium (SPIRIVA) 18 MCG inhalation capsule Place 18 mcg into inhaler and inhale daily.     vitamin B-12 (CYANOCOBALAMIN) 500 MCG tablet Take 500 mcg by mouth daily.     Vitamin D, Cholecalciferol, 1000 units CAPS Take by mouth.     meclizine (ANTIVERT) 12.5 MG tablet Take 12.5 mg by mouth 3 (three) times daily as needed.     No facility-administered medications prior to visit.     Review of Systems:   Constitutional:   No  weight loss, night sweats,  Fevers, chills,  +fatigue, or  lassitude.  HEENT:   No headaches,  Difficulty swallowing,  Tooth/dental problems, or  Sore throat,                No sneezing, itching, ear ache, nasal congestion, post nasal drip,   CV:  No chest pain,  Orthopnea, PND, swelling in lower extremities, anasarca, dizziness, palpitations, syncope.   GI  No heartburn, indigestion, abdominal pain, nausea, vomiting, diarrhea, change in bowel habits, loss of appetite, bloody stools.    Resp:    No chest wall deformity  Skin: no rash or lesions.  GU: no dysuria, change in color of urine, no urgency or frequency.  No flank pain, no hematuria   MS:  No joint pain or swelling.  No decreased range of motion.  No back pain.    Physical Exam  BP 130/70 (BP Location: Left Arm, Patient Position: Sitting, Cuff Size: Normal)   Pulse 91   Temp 97.6 F (36.4 C) (Oral)   Ht '5\' 4"'$  (1.626 m)   Wt 168 lb 12.8 oz (76.6 kg)   SpO2 96%   BMI 28.97 kg/m   GEN: A/Ox3; pleasant , NAD, chronically ill appearing on o2 in wc    HEENT:  [place]/AT,   , NOSE-clear, THROAT-clear, no lesions, no postnasal drip or exudate noted.   NECK:  Supple w/ fair ROM; no JVD; normal carotid impulses w/o bruits; no thyromegaly or nodules palpated; no lymphadenopathy.    RESP  scattered rhonchi .  no accessory muscle use, no dullness to percussion  CARD:  RRR, no m/r/g, tr peripheral edema, pulses intact, no cyanosis or clubbing.  GI:   Soft & nt; nml bowel sounds; no organomegaly or masses detected.   Musco: Warm bil, no deformities or joint swelling noted.   Neuro: alert, no focal deficits noted.    Skin: Warm, no lesions or rashes    Lab Results:  CBC No results found for: WBC, RBC, HGB, HCT, PLT, MCV, MCH, MCHC, RDW, LYMPHSABS, MONOABS, EOSABS, BASOSABS  BMET    Component Value Date/Time   CREATININE 0.91 07/28/2015 0929   CREATININE 0.98 05/18/2013 0835   GFRNONAA >60 07/28/2015 0929   GFRNONAA >60 05/18/2013 0835   GFRAA >60 07/28/2015 0929   GFRAA >60 05/18/2013 0835    BNP No results found for: BNP  ProBNP No results found for: PROBNP  Imaging: No results found.    No flowsheet data found.  No results found  for: NITRICOXIDE      Assessment & Plan:   COPD (chronic obstructive pulmonary disease) ([place]) Very severe COPD /GOLD D , prone to frequent exacerbations.  Patient has ongoing smoking.  Smoking cessation is key. She is on maximum maintenance regimen  including Breo, Spiriva, chronic steroids with prednisone 5 mg, Daliresp.  Recent hospitalization notes were reviewed.  Patient has not had an ABG that I was able to locate.  Could consider ABG to see if she has chronic hypercarbia to see if nocturnal bilevel support /trilogy might be of benefit to decrease exacerbations and prevent hospitalizations Long discussion regarding quality of life.  Discussion regarding palliative care and hospice as an extension of medical services in the home.  Patient will think about hospice and palliative care and call our office back if she reconsiders.  Plan  Patient Instructions  Continue on BREO and Spiriva daily  Continue on Daliresp daily  Continue on Prednisone '5mg'$  daily .  Continue on Xopenex neb Four times a day   Continue on Oxygen 2l/m. , goal is for O2 >88-90%.  Work on not smoking .  Use Flutter valve Three times a day  .  Activity as tolerated.  Hospital bed order  [place] , call back if you change in your mind.  Follow up with Dr. Mortimer Fries in 4 weeks and .As needed   Please contact office for sooner follow up if symptoms do not improve or worsen or seek emergency care         Chronic respiratory failure with hypoxia ([place]) Continue on oxygen 2 L.  O2 saturation goals greater than 88 to 90%.  Physical deconditioning Severe functional decline and deconditioning .  Order for hospital bed.  Recommend hospice/palliative care  On follow up consider home PT   Tobacco abuse Smoking cessation    I spent   42 minutes dedicated to the care of this patient on the date of this encounter to include pre-visit review of records, face-to-face time with the patient discussing conditions above, post visit ordering of testing, clinical documentation with the electronic health record, making appropriate referrals as documented, and communicating necessary findings to members of the patients care team.    Rexene Edison, NP 12/29/2020

## 2020-12-29 NOTE — Assessment & Plan Note (Signed)
Severe functional decline and deconditioning .  Order for hospital bed.  Recommend hospice/palliative care  On follow up consider home PT

## 2020-12-29 NOTE — Patient Instructions (Signed)
Continue on BREO and Spiriva daily  Continue on Daliresp daily  Continue on Prednisone '5mg'$  daily .  Continue on Xopenex neb Four times a day   Continue on Oxygen 2l/m. , goal is for O2 >88-90%.  Work on not smoking .  Use Flutter valve Three times a day  .  Activity as tolerated.  Hospital bed order  [place] , call back if you change in your mind.  Follow up with Dr. Mortimer Fries in 4 weeks and .As needed   Please contact office for sooner follow up if symptoms do not improve or worsen or seek emergency care

## 2020-12-29 NOTE — Assessment & Plan Note (Signed)
Continue on oxygen 2 L.  O2 saturation goals greater than 88 to 90%.

## 2020-12-29 NOTE — Assessment & Plan Note (Signed)
Very severe COPD /GOLD D , prone to frequent exacerbations.  Patient has ongoing smoking.  Smoking cessation is key. She is on maximum maintenance regimen including Breo, Spiriva, chronic steroids with prednisone 5 mg, Daliresp.  Recent hospitalization notes were reviewed.  Patient has not had an ABG that I was able to locate.  Could consider ABG to see if she has chronic hypercarbia to see if nocturnal bilevel support /trilogy might be of benefit to decrease exacerbations and prevent hospitalizations Long discussion regarding quality of life.  Discussion regarding palliative care and hospice as an extension of medical services in the home.  Patient will think about hospice and palliative care and call our office back if she reconsiders.  Plan  Patient Instructions  Continue on BREO and Spiriva daily  Continue on Daliresp daily  Continue on Prednisone '5mg'$  daily .  Continue on Xopenex neb Four times a day   Continue on Oxygen 2l/m. , goal is for O2 >88-90%.  Work on not smoking .  Use Flutter valve Three times a day  .  Activity as tolerated.  Hospital bed order  [place] , call back if you change in your mind.  Follow up with Dr. Mortimer Fries in 4 weeks and .As needed   Please contact office for sooner follow up if symptoms do not improve or worsen or seek emergency care

## 2020-12-29 NOTE — Assessment & Plan Note (Signed)
Smoking cessation  

## 2020-12-30 NOTE — Unmapped (Signed)
I discussed this patient's case including the history, clinical findings and assessment and plan with the resident physician. I reviewed the resident's note. I agree with the plan as documented in the resident's note. I was immediately available.     Ojani Berenson V Mira Balon, MD

## 2021-01-01 ENCOUNTER — Telehealth: Payer: Self-pay | Admitting: Internal Medicine

## 2021-01-01 DIAGNOSIS — J449 Chronic obstructive pulmonary disease, unspecified: Secondary | ICD-10-CM

## 2021-01-01 NOTE — Telephone Encounter (Signed)
Spoke to patient, who stated that she has decided to proceed with hospice care as discussed with TP on 12/29/2020.  Tammy, please advise.

## 2021-01-02 NOTE — Telephone Encounter (Signed)
Please place referral

## 2021-01-02 NOTE — Telephone Encounter (Signed)
Referral has been placed per patient request.  ATC patient unable to leave vm due to mailbox not being setup.  Will call back

## 2021-01-03 ENCOUNTER — Telehealth: Payer: Self-pay | Admitting: Adult Health

## 2021-01-03 MED ORDER — CLOBETASOL 0.05 % TOPICAL CREAM
Freq: Two times a day (BID) | TOPICAL | 1 refills | 0.00000 days | Status: CN
Start: 2021-01-03 — End: 2022-01-03

## 2021-01-03 NOTE — Unmapped (Signed)
Pt requested clobetasoL (TEMOVATE) 0.05 % cream be ordered instead of the ointment.         Medication pended for your review.

## 2021-01-03 NOTE — Telephone Encounter (Signed)
Lmtcb for Tammy.  I believe this needs to be an MD.

## 2021-01-03 NOTE — Telephone Encounter (Signed)
Patient is aware of below message and voiced her understanding.  Nothing further needed.   

## 2021-01-03 NOTE — Telephone Encounter (Signed)
Spoke with Tammy at [facility]  She states they are needing attending for hospice for this pt  Family is requesting this be Rexene Edison, NP  Tammy P out of the office until next wk, and they need answer on who will be attending sooner  Dr Mortimer Fries- are you okay with being her attending Please advise thanks!

## 2021-01-03 NOTE — Telephone Encounter (Signed)
Judy Erickson is returning phone call. Judy Erickson states family requested Judy Erickson Parrett NP. Judy Erickson phone number is [phone].

## 2021-01-04 NOTE — Unmapped (Signed)
[facility] Pharm[facility] Refill Coordination Note    Specialty Medication(s) to be Shipped:   Inflammatory Disorders: Dupixent    Other medication(s) to be shipped: clobeatsol oint     Lisa Andrews, DOB: 1950-08-04  Phone: [phone] (home)       All above HIPAA information was verified with patient.     Was a Nurse, learning disability used for this call? No    Completed refill call assessment today to schedule patient's medication shipment from the [facility] [phone]).  All relevant notes have been reviewed.     Specialty medication(s) and dose(s) confirmed: Regimen is correct and unchanged.   Changes to medications: [name] reports no changes at this time.  Changes to insurance: No  New side effects reported not previously addressed with a pharmacist or physician: None reported  Questions for the pharmacist: No    Confirmed patient received a Conservation officer, historic buildings and a Surveyor, mining with first shipment. The patient will receive a drug information handout for each medication shipped and additional FDA Medication Guides as required.       DISEASE/MEDICATION-SPECIFIC INFORMATION        For patients on injectable medications: Patient currently has 0 doses left.  Next injection is scheduled for 08/10.    SPECIALTY MEDICATION ADHERENCE     Medication Adherence    Patient reported X missed doses in the last month: 0  Specialty Medication: dupxient 300mg /61ml  Patient is on additional specialty medications: No  Patient is on more than two specialty medications: No  Any gaps in refill history greater than 2 weeks in the last 3 months: no  Demonstrates understanding of importance of adherence: yes  Informant: patient  Reliability of informant: reliable  Provider-estimated medication adherence level: good  Patient is at risk for Non-Adherence: No              Were doses missed due to medication being on hold? No    dupixent 300/2 mg/ml: 0 days of medicine on hand         REFERRAL TO PHARMACIST Referral to the pharmacist: Not needed      [facility]     Shipping address confirmed in Epic.     Delivery Scheduled: Yes, Expected medication delivery date: 08/04.     Medication will be delivered via Same Day Courier to the prescription address in Epic WAM.    Antonietta Barcelona   [facility] Specialty Technician

## 2021-01-04 NOTE — Telephone Encounter (Signed)
Lm x1 for Judy Erickson w/hospice.

## 2021-01-05 NOTE — Telephone Encounter (Signed)
I have spoken to [facility] with hospice, and relayed Dr. Zoila Shutter message. She voiced her understanding and had no further questions.  Nothing further needed at this time.

## 2021-01-05 NOTE — Telephone Encounter (Signed)
Lm x2 for Judy Erickson w/hospice.  Will close encounter per office protocol.

## 2021-01-11 MED FILL — DUPIXENT 300 MG/2 ML SUBCUTANEOUS PEN INJECTOR: SUBCUTANEOUS | 28 days supply | Qty: 4 | Fill #2

## 2021-01-30 NOTE — Unmapped (Signed)
Called and spoke to patient who stated she is no longer taking Dupixent. Routing to [employer].

## 2021-01-31 NOTE — Unmapped (Signed)
Patient said she doesn't need a refill and that she has plenty. She takes it TID. She said she is seeing hospice and its not [facility] hospice but she can't recall their name.

## 2021-01-31 NOTE — Unmapped (Signed)
Patient is requesting the following refill  Requested Prescriptions     Pending Prescriptions Disp Refills   ??? gabapentin (NEURONTIN) 300 MG capsule 360 capsule 0     Sig: TAKE 1 CAPSULE BY MOUTH TWICE A DAY & TAKE 2 CAPSULES AT BEDTIME       Recent Visits  Date Type Provider Dept   11/21/20 Office Visit Marca Ancona, MD [facility]   08/15/20 Office Visit Marca Ancona, MD [facility]   05/18/20 Office Visit Marca Ancona, MD [facility]   04/13/20 Office Visit Marca Ancona, MD [facility]   02/15/20 Office Visit Marca Ancona, MD [facility] [place]   Showing recent visits within past 365 days with a meds authorizing provider and meeting all other requirements  Future Appointments  Date Type Provider Dept   02/22/21 Appointment Marca Ancona, MD [facility] [place]   Showing future appointments within next 365 days with a meds authorizing provider and meeting all other requirements       Changed to [place] for bubble packs, they said it would take a week or more to get her medication ready, please advise, thanks

## 2021-01-31 NOTE — Unmapped (Signed)
Lisa Andrews, can you have her clarify how often she is taking gabapentin AND find out what happened with hospice. If you look at encounters and [facility] admin encounters, you can see there are about 30 appointments hospice cancelled. Was this patient's choice? Thanks, Vernona Rieger

## 2021-02-01 MED ORDER — GABAPENTIN 300 MG CAPSULE
ORAL_CAPSULE | 2 refills | 0 days
Start: 2021-02-01 — End: ?

## 2021-02-01 NOTE — Unmapped (Signed)
So I would assume the hospice doctor is ordering everything for her as we have not heard from them???

## 2021-02-01 NOTE — Unmapped (Signed)
Patient states that Marcell Anger has taken over all of her care. She said she will miss you and appreciates all your care.

## 2021-02-15 ENCOUNTER — Ambulatory Visit: Payer: Medicare Other | Admitting: Internal Medicine

## 2021-02-21 NOTE — Unmapped (Signed)
Patient is scheduled for a follow up appt tomorrow. Can you check in with her? I spoke to her recently and she had transferred her care to hospice. I don't believe she needs the appt tomorrow since they are managing her care now.

## 2021-02-21 NOTE — Unmapped (Signed)
Under hospice care 

## 2021-03-01 NOTE — Unmapped (Signed)
Specialty Medication(s): Dupixent    Lisa Andrews has been dis-enrolled from the [facility] specialty pharmacy services due to medication discontinuation resulting from progression of disease.    Additional information provided to the patient: Patient informed pharmacy staff 8/23 that she had stopped Dupixent and was under Hospice care. Will stop following and inform dermatology of her decision/care.    Lisa Andrews  [facility] Specialty Pharmacist

## 2021-03-30 MED ORDER — LOSARTAN 100 MG TABLET
ORAL_TABLET | 0 refills | 0 days
Start: 2021-03-30 — End: ?
